# Patient Record
Sex: Female | Born: 1945 | Race: White | Hispanic: No | Marital: Married | Smoking: Former smoker
Health system: Southern US, Community
[De-identification: ages and names within clinical notes are randomized; demographics above are authoritative.]

## PROBLEM LIST (undated history)

## (undated) DIAGNOSIS — R569 Unspecified convulsions: Secondary | ICD-10-CM

## (undated) DIAGNOSIS — H539 Unspecified visual disturbance: Secondary | ICD-10-CM

## (undated) DIAGNOSIS — G709 Myoneural disorder, unspecified: Secondary | ICD-10-CM

## (undated) DIAGNOSIS — G35 Multiple sclerosis: Secondary | ICD-10-CM

## (undated) DIAGNOSIS — I480 Paroxysmal atrial fibrillation: Secondary | ICD-10-CM

## (undated) DIAGNOSIS — K219 Gastro-esophageal reflux disease without esophagitis: Secondary | ICD-10-CM

## (undated) DIAGNOSIS — G259 Extrapyramidal and movement disorder, unspecified: Secondary | ICD-10-CM

## (undated) DIAGNOSIS — I10 Essential (primary) hypertension: Secondary | ICD-10-CM

## (undated) DIAGNOSIS — T7840XA Allergy, unspecified, initial encounter: Secondary | ICD-10-CM

## (undated) DIAGNOSIS — M1990 Unspecified osteoarthritis, unspecified site: Secondary | ICD-10-CM

## (undated) DIAGNOSIS — G2581 Restless legs syndrome: Secondary | ICD-10-CM

## (undated) DIAGNOSIS — N179 Acute kidney failure, unspecified: Secondary | ICD-10-CM

## (undated) DIAGNOSIS — E0590 Thyrotoxicosis, unspecified without thyrotoxic crisis or storm: Secondary | ICD-10-CM

## (undated) DIAGNOSIS — F419 Anxiety disorder, unspecified: Secondary | ICD-10-CM

## (undated) HISTORY — DX: Essential (primary) hypertension: I10

## (undated) HISTORY — PX: BACK SURGERY: SHX140

## (undated) HISTORY — PX: PARTIAL HYSTERECTOMY: SHX80

## (undated) HISTORY — DX: Restless legs syndrome: G25.81

## (undated) HISTORY — DX: Unspecified visual disturbance: H53.9

## (undated) HISTORY — DX: Multiple sclerosis: G35

## (undated) HISTORY — DX: Extrapyramidal and movement disorder, unspecified: G25.9

---

## 2005-05-26 ENCOUNTER — Ambulatory Visit: Payer: Self-pay | Admitting: Family Medicine

## 2005-06-03 ENCOUNTER — Ambulatory Visit: Payer: Self-pay | Admitting: Family Medicine

## 2005-06-11 ENCOUNTER — Inpatient Hospital Stay (HOSPITAL_COMMUNITY): Admission: RE | Admit: 2005-06-11 | Discharge: 2005-06-12 | Payer: Self-pay | Admitting: Neurosurgery

## 2008-11-13 ENCOUNTER — Ambulatory Visit: Payer: Self-pay | Admitting: Vascular Surgery

## 2008-12-25 ENCOUNTER — Ambulatory Visit: Payer: Self-pay | Admitting: Vascular Surgery

## 2009-01-01 ENCOUNTER — Inpatient Hospital Stay: Payer: Self-pay | Admitting: Vascular Surgery

## 2009-04-18 ENCOUNTER — Ambulatory Visit: Payer: Self-pay | Admitting: Internal Medicine

## 2009-11-18 ENCOUNTER — Ambulatory Visit: Payer: Self-pay | Admitting: Neurosurgery

## 2009-12-12 ENCOUNTER — Inpatient Hospital Stay (HOSPITAL_COMMUNITY): Admission: RE | Admit: 2009-12-12 | Discharge: 2009-12-13 | Payer: Self-pay | Admitting: Neurosurgery

## 2010-01-15 ENCOUNTER — Encounter: Admission: RE | Admit: 2010-01-15 | Discharge: 2010-01-15 | Payer: Self-pay | Admitting: Neurosurgery

## 2010-03-12 ENCOUNTER — Encounter: Admission: RE | Admit: 2010-03-12 | Discharge: 2010-03-12 | Payer: Self-pay | Admitting: Neurosurgery

## 2010-10-12 LAB — CBC
HCT: 40.6 % (ref 36.0–46.0)
Hemoglobin: 13.7 g/dL (ref 12.0–15.0)
MCHC: 33.7 g/dL (ref 30.0–36.0)
MCV: 90.8 fL (ref 78.0–100.0)
Platelets: 259 10*3/uL (ref 150–400)
RBC: 4.47 MIL/uL (ref 3.87–5.11)
RDW: 13.6 % (ref 11.5–15.5)
WBC: 7.1 10*3/uL (ref 4.0–10.5)

## 2010-10-12 LAB — TYPE AND SCREEN
ABO/RH(D): O POS
Antibody Screen: NEGATIVE

## 2010-10-12 LAB — DIFFERENTIAL
Basophils Absolute: 0.1 10*3/uL (ref 0.0–0.1)
Basophils Relative: 1 % (ref 0–1)
Eosinophils Absolute: 0.6 10*3/uL (ref 0.0–0.7)
Eosinophils Relative: 8 % — ABNORMAL HIGH (ref 0–5)
Lymphocytes Relative: 31 % (ref 12–46)
Lymphs Abs: 2.2 10*3/uL (ref 0.7–4.0)
Monocytes Absolute: 0.7 10*3/uL (ref 0.1–1.0)
Monocytes Relative: 10 % (ref 3–12)
Neutro Abs: 3.5 10*3/uL (ref 1.7–7.7)
Neutrophils Relative %: 50 % (ref 43–77)

## 2010-10-12 LAB — SURGICAL PCR SCREEN
MRSA, PCR: NEGATIVE
Staphylococcus aureus: POSITIVE — AB

## 2010-10-12 LAB — BASIC METABOLIC PANEL
BUN: 12 mg/dL (ref 6–23)
CO2: 28 mEq/L (ref 19–32)
Calcium: 9.5 mg/dL (ref 8.4–10.5)
Chloride: 104 mEq/L (ref 96–112)
Creatinine, Ser: 0.68 mg/dL (ref 0.4–1.2)
GFR calc Af Amer: >60 mL/min (ref >60)
GFR calc non Af Amer: >60 mL/min (ref >60)
Glucose, Bld: 86 mg/dL (ref 70–99)
Potassium: 3.8 mEq/L (ref 3.5–5.1)
Sodium: 138 mEq/L (ref 135–145)

## 2010-10-12 LAB — ABO/RH: ABO/RH(D): O POS

## 2010-12-11 NOTE — Op Note (Signed)
NAMECARLINE, Brandi Gibbs                 ACCOUNT NO.:  [id number]   MEDICAL RECORD NO.:  [id number]          PATIENT TYPE:  INP   LOCATION:  2899                         FACILITY:  [facility]   PHYSICIAN:  Kathaleen Maser. Pool, M.D.    DATE OF BIRTH:  12-Oct-1945   DATE OF PROCEDURE:  06/11/2005  DATE OF DISCHARGE:                                 OPERATIVE REPORT   SERVICE:  Neurosurgery.   PREOPERATIVE DIAGNOSES:  L4-L5 stenosis.   POSTOPERATIVE DIAGNOSIS:  L4-Lt stenosis.   PROCEDURE:  Bilateral L4-L5 decompressive laminotomies with foraminotomies.   SURGEON:  Kathaleen Maser. Pool, M.D.   ASSISTANT:  Reinaldo Meeker, M.D.   ANESTHESIA:  General endotracheal.   INDICATIONS FOR PROCEDURE:  [name] is a 65 year old female with a history  of multiple sclerosis who presents with back and bilateral lower extremity  pain and claudication consistent with neurogenic claudication from stenosis.  Workup has demonstrated marked stenosis at L4-L5.  The patient presents now  for L4-L5 bilateral decompressive laminotomies and foraminotomies in hopes  of improving her symptoms.   DESCRIPTION OF PROCEDURE:  The patient was brought to the operating room and  placed on the table in a supine position.  After an adequate level of  anesthesia was achieved, the patient was positioned prone on the Wilson  frame and properly padded.  The patient's lumbar region was prepped and  draped sterilely.  A 10 blade was used to make a skin incision was made  overlying the L4-L5 interspace.  This was carried down sharply in the  midline.  Subperiosteal dissection was performed exposing the lamina and  facet joints at L4 and L5.  Deep self-retaining retractors were placed.  Interoperative x-ray was confirmed and the level was confirmed.   A decompressive laminotomy was then performed bilaterally using the high  speed drill and Kerrison rongeurs to remove the inferior aspect of the  lamina of L4, medial aspect of the L4-L5 facet  joint, and the superior rim  of the L5 lamina bilaterally.  The ligamentum flavum was then elevated and  resected in piecemeal fashion using Kerrison rongeurs whereby the thecal sac  and exiting L5 nerve roots were identified bilaterally.  Wide decompressive  foraminotomy was then performed along the course of the exiting L5 nerve  root bilaterally.  The facet joints were then under cut using Kerrison  rongeurs.  After a very thorough decompression had been achieved, there was  no evidence of injury to the thecal sac and nerve roots.  There is no  evidence of any residual compression of the thecal sac and nerve roots.  The  wound was irrigated with antibiotic solution, Gelfoam was placed topically  for hemostasis which was found to be good.  The microscope and retractors  were removed.  Hemostasis in the muscle was achieved  with electrocautery.  The wounds were closed in layers with Vicryl sutures.  Steri-Strips and sterile dressing were applied.  There were no  complications.  The patient tolerated the procedure well and she was  returned to the recovery room in stable condition.  ______________________________  Kathaleen Maser Pool, M.D.     HAP/MEDQ  D:  06/11/2005  T:  06/12/2005  Job:  [id number]

## 2011-02-17 ENCOUNTER — Ambulatory Visit: Payer: Self-pay | Admitting: Unknown Physician Specialty

## 2011-02-19 LAB — PATHOLOGY REPORT

## 2011-07-01 ENCOUNTER — Other Ambulatory Visit: Payer: Self-pay | Admitting: Unknown Physician Specialty

## 2011-11-04 ENCOUNTER — Ambulatory Visit: Payer: Self-pay | Admitting: Family Medicine

## 2013-02-01 ENCOUNTER — Ambulatory Visit: Payer: Self-pay | Admitting: Family Medicine

## 2014-03-28 DIAGNOSIS — I10 Essential (primary) hypertension: Secondary | ICD-10-CM | POA: Insufficient documentation

## 2014-03-28 DIAGNOSIS — G35 Multiple sclerosis: Secondary | ICD-10-CM | POA: Insufficient documentation

## 2014-03-28 DIAGNOSIS — M8580 Other specified disorders of bone density and structure, unspecified site: Secondary | ICD-10-CM | POA: Insufficient documentation

## 2014-03-28 DIAGNOSIS — E785 Hyperlipidemia, unspecified: Secondary | ICD-10-CM | POA: Insufficient documentation

## 2014-03-28 DIAGNOSIS — E0590 Thyrotoxicosis, unspecified without thyrotoxic crisis or storm: Secondary | ICD-10-CM | POA: Insufficient documentation

## 2014-03-28 DIAGNOSIS — G35D Multiple sclerosis, unspecified: Secondary | ICD-10-CM | POA: Insufficient documentation

## 2014-03-28 DIAGNOSIS — E7849 Other hyperlipidemia: Secondary | ICD-10-CM | POA: Insufficient documentation

## 2014-05-02 ENCOUNTER — Ambulatory Visit: Payer: Self-pay | Admitting: Family Medicine

## 2014-09-11 ENCOUNTER — Ambulatory Visit (INDEPENDENT_AMBULATORY_CARE_PROVIDER_SITE_OTHER): Payer: Medicare Other | Admitting: Neurology

## 2014-09-11 ENCOUNTER — Encounter: Payer: Self-pay | Admitting: *Deleted

## 2014-09-11 ENCOUNTER — Encounter: Payer: Self-pay | Admitting: Neurology

## 2014-09-11 VITALS — BP 118/62 | HR 64 | Resp 16 | Ht 63.0 in | Wt 142.4 lb

## 2014-09-11 DIAGNOSIS — M545 Low back pain, unspecified: Secondary | ICD-10-CM

## 2014-09-11 DIAGNOSIS — R4189 Other symptoms and signs involving cognitive functions and awareness: Secondary | ICD-10-CM | POA: Insufficient documentation

## 2014-09-11 DIAGNOSIS — R259 Unspecified abnormal involuntary movements: Secondary | ICD-10-CM | POA: Insufficient documentation

## 2014-09-11 DIAGNOSIS — R258 Other abnormal involuntary movements: Secondary | ICD-10-CM

## 2014-09-11 DIAGNOSIS — R069 Unspecified abnormalities of breathing: Secondary | ICD-10-CM | POA: Insufficient documentation

## 2014-09-11 DIAGNOSIS — G2581 Restless legs syndrome: Secondary | ICD-10-CM | POA: Diagnosis not present

## 2014-09-11 DIAGNOSIS — G4700 Insomnia, unspecified: Secondary | ICD-10-CM | POA: Diagnosis not present

## 2014-09-11 DIAGNOSIS — G35 Multiple sclerosis: Secondary | ICD-10-CM | POA: Diagnosis not present

## 2014-09-11 DIAGNOSIS — R269 Unspecified abnormalities of gait and mobility: Secondary | ICD-10-CM | POA: Insufficient documentation

## 2014-09-11 DIAGNOSIS — R6889 Other general symptoms and signs: Secondary | ICD-10-CM | POA: Insufficient documentation

## 2014-09-11 DIAGNOSIS — R252 Cramp and spasm: Secondary | ICD-10-CM | POA: Insufficient documentation

## 2014-09-11 DIAGNOSIS — R3911 Hesitancy of micturition: Secondary | ICD-10-CM | POA: Insufficient documentation

## 2014-09-11 DIAGNOSIS — R260 Ataxic gait: Secondary | ICD-10-CM | POA: Diagnosis not present

## 2014-09-11 DIAGNOSIS — R5383 Other fatigue: Secondary | ICD-10-CM | POA: Diagnosis not present

## 2014-09-11 DIAGNOSIS — R0683 Snoring: Secondary | ICD-10-CM | POA: Insufficient documentation

## 2014-09-11 MED ORDER — OXYBUTYNIN CHLORIDE 5 MG PO TABS: 5.0000 mg | ORAL_TABLET | Freq: Two times a day (BID) | ORAL | Status: DC

## 2014-09-11 MED ORDER — PRAMIPEXOLE DIHYDROCHLORIDE 0.5 MG PO TABS: 0.5000 mg | ORAL_TABLET | Freq: Three times a day (TID) | ORAL | Status: DC

## 2014-09-11 MED ORDER — BACLOFEN 10 MG PO TABS: 10.0000 mg | ORAL_TABLET | Freq: Three times a day (TID) | ORAL | Status: DC

## 2014-09-11 MED ORDER — HYDROCODONE-ACETAMINOPHEN 5-325 MG PO TABS: 1.0000 | ORAL_TABLET | Freq: Two times a day (BID) | ORAL | Status: DC | PRN

## 2014-09-11 MED ORDER — TAMSULOSIN HCL 0.4 MG PO CAPS: 0.4000 mg | ORAL_CAPSULE | Freq: Every day | ORAL | Status: DC

## 2014-09-11 NOTE — Progress Notes (Signed)
[facility]  PATIENT: Brandi Gibbs DOB: November 03, 1945  REFERRING DOCTOR OR PCP:  Dorothey Baseman SOURCE: Patient  _________________________________   HISTORICAL  CHIEF COMPLAINT:  Chief Complaint  Patient presents with  . Multiple Sclerosis    She is not currently on any MS therapy--she stopped ? Betaseron in Dec 2015 due to inj. site fatigue.  She sts. fatigue is worse, she is dragging right foot some more.  She has a brace at home for foot drop.  She would like to discuss starting a po med for ms./fim    HISTORY OF PRESENT ILLNESS:  Brandi Gibbs is a 69 year old with multiple sclerosis who was diagnosed in the mid 1980s after presenting with right sided paralysis. She had MRIs and a lumbar puncture performed. They were reportedly consistent with multiple sclerosis. Initially, she was not placed on any medications. She had several exacerbations though in the 1990s that affected her gait. I started to see her around 2000 and 10 2002. At that time, her gait was poor and she needed a cane for short distance and walker for longer distance we started Avonex and she tolerated it well. MRIs did not show any major changes since that time. However, she has continued to have some worsening of her gait and now needs to use a walker full-time and also cannot walk as far. On multiple occasions, we discussed changing to different medications but she was reluctant to move from Avonex. However, she is tired of the injections she would like to reconsider one of the oral agents at this point.  He has several symptoms related to her multiple sclerosis. Specifically, she reports leg weakness and spasticity and has a poor gait. She also reports fatigue, urinary dysfunction and some cognitive concerns.  She began to use a cane in the 1990s and began to use a walker about 10 years later. Her right leg is much worse than the left leg. He notes weakness as well as quite a bit of spasticity in the  right leg. At times, her leg will have a phasic spasm that takes a while to break. She falls on occasion with a couple falls over the last few months. She denies any significant numbness, tingling or dysesthesias in the legs.  She also has had a lot of difficulty with her bladder. She has urinary hesitancy as well as frequency. She is on tamsulosin and oxybutynin. She does better on these medications but still has frequency and nocturia. However, she has less incontinence with the medications. She denies any recent urinary tract infections.  She feels both of her eyes are bad and the changes have occurred over many years. She has not seen an eye doctor recently. Vision is blurry. She denies double vision. There is no eye pain. She denies any significant asymmetry in acuity or color vision.  She notes both physical and cognitive fatigue. She has tried amantadine with little benefit. Fatigue can fluctuate quite a bit day-to-day. She does especially poorly when she is in hot weather. Often the fatigue will be worse later in the day. She reports insomnia with more difficulty staying asleep and falling asleep the first time. She reports that she snores and her husband has told her that she has some irregular breathing at night. She has excessive daytime sleepiness. Sleep studies have been discussed in the past but we have not been able to get one yet.  She feels that her cognitive issues are getting worse. Specifically she has noted  difficulty with short-term memory. She also has trouble coming up with the right words. She notes difficulty with apathy and with executive function. She has trouble planning tasks and completing them.  She denies any significant depression.  She had back surgery about 5 years ago for a herniated disc and lumbar radiculopathy. Still reports some back pain at times she occasionally needs to take hydrocodone when the pain becomes more severe, but this is not daily.  REVIEW OF  SYSTEMS: Constitutional: No fevers, chills, sweats, or change in appetite.   She is very tired all the time Eyes: Some visual changes, but no double vision or eye pain Ear, nose and throat: No hearing loss, ear pain, nasal congestion, sore throat Cardiovascular: No chest pain, palpitations Respiratory: No shortness of breath at rest or with exertion.   No wheezes GastrointestinaI: No nausea, vomiting, diarrhea, abdominal pain, fecal incontinence Genitourinary: see above. Musculoskeletal: Mild neck pain, moderate back pain Integumentary: No rash, pruritus, skin lesions Neurological: as above Psychiatric: No depression at this time.  No anxiety. However, apathetic and cognitive changes (as above) Endocrine: No palpitations, diaphoresis, change in appetite, change in weigh or increased thirst Hematologic/Lymphatic: No anemia, purpura, petechiae. Allergic/Immunologic: No itchy/runny eyes, nasal congestion, recent allergic reactions, rashes  ALLERGIES: Allergies  Allergen Reactions  . Other Other (See Comments)    Blood thinners - GI bleed  . Omeprazole Rash    HOME MEDICATIONS:  Current outpatient prescriptions:  .  Amantadine HCl 100 MG tablet, Take 100 mg by mouth 2 (two) times daily., Disp: , Rfl:  .  amLODipine (NORVASC) 5 MG tablet, TAKE 1 TABLET EVERY DAY, Disp: , Rfl:  .  atenolol-chlorthalidone (TENORETIC) 50-25 MG per tablet, TAKE 1 TABLET EVERY DAY, Disp: , Rfl:  .  HYDROcodone-acetaminophen (NORCO/VICODIN) 5-325 MG per tablet, Take by mouth., Disp: , Rfl:  .  lisinopril (PRINIVIL,ZESTRIL) 40 MG tablet, TAKE 1 TABLET EVERY DAY, Disp: , Rfl:  .  methimazole (TAPAZOLE) 10 MG tablet, TAKE ONE TABLET TWICE A DAY, Disp: , Rfl:  .  oxybutynin (DITROPAN) 5 MG tablet, Take by mouth., Disp: , Rfl:  .  pramipexole (MIRAPEX) 0.5 MG tablet, Take 0.5 mg by mouth 3 (three) times daily., Disp: , Rfl:  .  tamsulosin (FLOMAX) 0.4 MG CAPS capsule, Take by mouth., Disp: , Rfl:   PAST  MEDICAL HISTORY: Past Medical History  Diagnosis Date  . Multiple sclerosis   . Movement disorder   . Hypertension   . Vision abnormalities   . Restless leg syndrome     PAST SURGICAL HISTORY: Past Surgical History  Procedure Laterality Date  . Partial hysterectomy    . Back surgery      FAMILY HISTORY: Family History  Problem Relation Age of Onset  . Leukemia Mother   . Healthy Father     SOCIAL HISTORY:  History   Social History  . Marital Status: Married    Spouse Name: N/A  . Number of Children: N/A  . Years of Education: N/A   Occupational History  . Not on file.   Social History Main Topics  . Smoking status: Never Smoker   . Smokeless tobacco: Not on file  . Alcohol Use: 0.0 oz/week    0 Standard drinks or equivalent per week     Comment: rare/fim  . Drug Use: No  . Sexual Activity: Not on file   Other Topics Concern  . Not on file   Social History Narrative  . No narrative on  file     PHYSICAL EXAM  Filed Vitals:   09/11/14 1111  BP: 118/62  Pulse: 64  Resp: 16  Height:  (1.6 m)  Weight: 142 lb 6.4 oz (64.592 kg)    Body mass index is 25.23 kg/(m^2).   General: The patient is well-developed and well-nourished and in no acute distress  Eyes:  Funduscopic exam shows normal optic discs and retinal vessels.  Neck: The neck is supple, no carotid bruits are noted.  The neck is nontender.  Cardiovascular: The heart has a regular rate and rhythm with a normal S1 and S2. There were no murmurs, gallops or rubs. Lungs are clear to auscultation.  Skin: Extremities are without significant edema.  Musculoskeletal:  Back is nontender  Neurologic Exam  Mental status: The patient is alert and oriented x 3 at the time of the examination. The patient has apparent normal recent and remote memory, with an apparently normal attention span and concentration ability.   Speech is normal.  Cranial nerves: Extraocular movements are full. Pupils  are equal, round, and reactive to light and accomodation.  Visual fields are full.  Facial symmetry is present. There is good facial sensation to soft touch bilaterally.Facial strength is normal.  Trapezius and sternocleidomastoid strength is normal. No dysarthria is noted.  The tongue is midline, and the patient has symmetric elevation of the soft palate. No obvious hearing deficits are noted.  Motor:  Muscle bulk is normal.   Tone is increased in both legs, slightly more on the rightl. Strength is  5 / 5 in all arms, 4/5 in right foot and 4+/5 in left foot..   Sensory: Sensory testing is intact to pinprick, soft touch and vibration sensation in all 4 extremities.  Coordination: Cerebellar testing reveals good finger-nose-finger and heel-to-shin bilaterally.  Gait and station: Station is normal.   Gait is spastic and requires support.  She has superimposed right foot drop. Can not tandem walk. Romberg is negative.   Reflexes: Deep tendon reflexes are symmetric and increased bilaterally.  Spread at knees.   2 beatrs clonus at right ankle.   Plantar responses are extensor right and equivocal left.Marland Kitchen    DIAGNOSTIC DATA (LABS, IMAGING, TESTING) - I reviewed patient records, labs, notes, testing and imaging myself where available.     ASSESSMENT AND PLAN  Ataxic gait  DS (disseminated sclerosis)  Cognitive changes  Other fatigue  Urinary hesitancy  Spasticity  Midline low back pain without sciatica  Insomnia  Restless leg  Snoring    In summary, Brandi Gibbs is a 69 year old woman with multiple sclerosis and spastic gait, cognitive impairment, fatigue and urinary dysfunction. We had a long discussion about therapy. She needs to get back on a DMT but would prefer to go on an oral agent. Last year, we had tested her to Aubagio but she changed her mind and stayed on Avonex. We will go ahead and start Aubagio at this time. She will need to get some liver tests done over the next few  months. Her TB test was negative last year. I will continue her medicines for her bladder and restless legs and add baclofen to see if that can help her spasticity more. This may also help her insomnia later this year, we will check an MRI of the brain to rule out clinical progression. We had a discussion about the possibility of sleep apnea and that it may be playing some role in her fatigue and cognitive changes. She would prefer  not to have a sleep study at this time.  I will discuss this with her again at her next visit.  She will return to see me in 3 or 4 months or sooner if she has new or worsening neurologic symptoms.  Richard A. Epimenio Foot, MD, PhD 09/11/2014, 11:23 AM Certified in Neurology, Clinical Neurophysiology, Sleep Medicine, Pain Medicine and Neuroimaging  [facility] [address] [place], [place] [address] [phone]

## 2014-10-08 ENCOUNTER — Telehealth: Payer: Self-pay | Admitting: *Deleted

## 2014-10-08 ENCOUNTER — Inpatient Hospital Stay: Payer: Self-pay | Admitting: Internal Medicine

## 2014-10-08 NOTE — Telephone Encounter (Signed)
Spoke with husband who sts. Kyran has been getting weaker, has had increased incontinence of bowel and bladder, ? decubitus ulcer over the last several days--she was Admitted to [facility] today./fim

## 2014-10-08 NOTE — Telephone Encounter (Signed)
Husband is calling back stating pt was sent by ambulance to [facility] in [place].  He is sure they will keep her, she can't walk or go to the bathroom.  He just wanted to make you aware, no call back is needed.

## 2014-10-08 NOTE — Telephone Encounter (Signed)
Patient would like a call back did not want to state reason.

## 2014-10-08 NOTE — Telephone Encounter (Signed)
LMTC./fim 

## 2014-10-15 ENCOUNTER — Ambulatory Visit: Admit: 2014-10-15 | Disposition: A | Discharge status: Home / Self Care | Payer: Self-pay | Admitting: Neurology

## 2014-10-17 LAB — BASIC METABOLIC PANEL
Anion Gap: 5 — ABNORMAL LOW (ref 7–16)
BUN: 29 mg/dL — ABNORMAL HIGH
Calcium, Total: 8.7 mg/dL — ABNORMAL LOW
Chloride: 111 mmol/L
Co2: 29 mmol/L
Creatinine: 0.6 mg/dL
EGFR (African American): >60
EGFR (Non-African Amer.): >60
Glucose: 227 mg/dL — ABNORMAL HIGH
Potassium: 3.3 mmol/L — ABNORMAL LOW
Sodium: 145 mmol/L

## 2014-11-24 NOTE — H&P (Signed)
PATIENT NAME:  Brandi Gibbs, VAZQUES MR#:  [id number] DATE OF BIRTH:  [birth year]  DATE OF ADMISSION:  10/08/2014  PRIMARY CARE PHYSICIAN:  Dorothey Baseman, MD.    HISTORY OF PRESENT ILLNESS:  The patient is a 69 year old Caucasian female, history of multiple sclerosis, also history of dysphagia in the past, who presented to the hospital with complaints of inability to ambulate, weakness over the past 3 days.  Apparently the patient was doing well up until approximately two weeks ago when she started having problems with weakness and inability to ambulate. She also noted strong odor of her urine. She felt feverish at home, but denied any significant chills. She admits of weight loss of over 20 pounds over a couple of months now which she describes as intentional. She was brought to Emergency Room for further evaluation. In the Emergency Room she was noted to have urinary tract infection as well as significant hypotension with systolic blood pressure below 130Q in 90s.  She was given IV fluids and her blood pressure somewhat improved now. Hospitalist services were contacted for admission since she was also noted to have elevated white blood cell count and as mentioned above urinary tract infection.   PAST MEDICAL HISTORY: Significant for history of multiple sclerosis with right-sided weakness, history of dysphagia in the past which was felt to be due to candidal esophagitis, also history of ulcerative colitis, hypertension, anemia, carotid artery stenosis status post right carotid endarterectomy in June of 2010, history of atrial fibrillation, RVR, status post TEE and cardioversion in 2010, status post upper GI endoscopy in July 2012 by Dr. Lynnae Prude revealing gastritis only, status post colonoscopy at the same time July 2012 by Dr. Mechele Collin revealing a 5 mm polyp in the ascending colon, inflammation was found secondary to left-sided colitis.   MEDICATION LIST:  The patient's medication list unfortunately is not  reconciled yet, the patient was apparently on acetaminophen and hydrocodone 500/5 mg 1 tablet 4 times daily as needed, amlodipine 5 mg p.o. daily, aspirin 81 mg p.o. daily, Avonex 30 mcg intramuscularly weekly, bisoprolol/HCTZ 5/6.25 mg 1 tablet daily, calcium with vitamin D 1 tablet twice daily, ciprofloxacin 500 mg twice dailky,  green tea fat burner, lisinopril 40 mg p.o. daily, methimazole 10 mg 2 tablets twice daily, Norco 1-2 tablets every 6 hours as needed, oxybutynin 5 mg 2-3 times daily, Papaya Enzyme Plus after meals as needed, pramipexole 0.25 mg p.o. at bedtime, Triple Omega 3-6-9 one tablet daily, women's multivitamins twice daily, vitamin B complex once daily, vitamin B12, 100 mcg p.o. daily, vitamin D3, 1000 units twice daily. It is unclear if she is still taking all those medications.   FAMILY HISTORY:  The patient's father died of AAA rupture, the patient's mother had leukemia.   SOCIAL HISTORY: The patient smokes 4-5 cigarettes a day for approximately 30-40 years. No alcohol abuse. She lives with her husband.   REVIEW OF SYSTEMS: Positive for feeling feverish, fatigue and weak, weight loss approximately 20 pounds over a couple of months, she tells me it was intentional. Admits of having glaucoma for which she is not taking any medications, intermittent diarrhea as well as urinary incontinence intermittently. Otherwise denies any abnormalities.  No pains.  No weight gain.  EYES: Denies any blurry vision, double vision, cataracts.   EARS, NOSE, THROAT:  Denies any tinnitus, allergies, epistaxis, sinus pain, dentures, difficulty swallowing.   RESPIRATORY:  Denies cough, wheezes, asthma, or COPD.   CARDIOVASCULAR: Denies chest pains, orthopnea, edema, arrhythmias, palpitations.  GASTROINTESTINAL: Denies any nausea, vomiting, abdominal pain, rectal bleeding, change in bowel habits.  GENITOURINARY: Denies dysuria, hematuria, frequency.   ENDOCRINE:  Denies polydipsia, nocturia, thyroid  problems, heat or cold intolerance, or thirst.  HEMATOLOGIC: Denies anemia, easy bruising or bleeding, swollen glands.  SKIN: Denies any acne, rashes, change in moles.  MUSCULOSKELETAL: Denies arthritis, cramps, swelling.  NEUROLOGIC:  No numbness, epilepsy, or tremor.  PSYCHIATRIC:  Denies anxiety, insomnia, or depression.   PHYSICAL EXAMINATION:  VITAL SIGNS: On arrival to the hospital the patient's vital signs, temperature was 98.4, pulse was 74, respiration was 18, blood pressure 91/43, saturation was 98% on room air. With time with IV fluid administration the patient's blood pressure improved to 130/45, heart rate is 54, respiration was 16 most recently. O2 saturations remained stable.  GENERAL: This is a well-developed, well-nourished Caucasian female in no significant distress, lying on the stretcher. She is somewhat flushed in the face laying on the stretcher.  HEENT:  She does have conjunctivitis in her left eye. Normal hearing. No pharyngeal erythema. Mucosa is dry.   NECK:  No masses.  Supple, nontender. Thyroid not enlarged. No adenopathy. No JVD or carotid bruits bilaterally. Full range of motion. LUNGS: Clear to auscultation in all fields anteriorly. No rales, rhonchi, diminished breath sounds, or wheezing.  No labored inspiration, increased effort, dullness to percussion, or overt respiratory distress.   CARDIOVASCULAR: S1, S2 appreciated. Rhythm is regular. PMI is not lateralized. Chest is nontender to palpation.  1 + pedal pulses.   EXTREMITIES: No lower extremity edema, calf tenderness, or cyanosis was noted.  ABDOMEN: Soft, nontender. Bowel sounds are present. Minimal discomfort was felt in suprapubic area, but no hepatosplenomegaly or masses were noted.  RECTAL: Deferred.  MUSCLE STRENGTH: Able to move all extremities. No cyanosis, degenerative joint disease. Not able to assess for kyphosis.   SKIN: Did not reveal any rashes, lesions, erythema, nodularity, or induration. It was  warm and dry to palpation.  LYMPHATIC: No adenopathy in the cervical region.  NEUROLOGIC: Cranial nerves grossly intact. Sensory is difficult to obtain as the patient is somewhat somnolent.  No significant dysarthria or aphasia. The patient is somnolent, although she waking up and intermittently she was able to discuss her issues and then she would drift to sleep.  Memory is good.  No significant confusion, agitation, or depression noted.   LABORATORIES: BMP showed glucose level of 189, BUN and creatinine were 52 and 1.58, sodium 132, potassium 2.2, otherwise BMP was unremarkable. Lactic acid level was 1.2. Liver enzymes, albumin level of 2.9, otherwise normal. CK total was 198, MB fraction was 6.7. Troponin 0.12 on the first set. The patient's white blood cell count is elevated to 12.5, hemoglobin 10.9, platelet count 240,000.  Urinalysis, yellow turbid urine, negative for glucose, bilirubin, or ketones, specific gravity 1.014, pH was 5.0, 1 + blood, 100 mg/dL protein, negative for nitrites, 3 + leukocyte esterase, 12 red blood cells, 1807 white blood cells, 3 + bacteria, no epithelial cells, white blood cell clumps were present as well as mucus. Chest x-ray portable single view 10/08/2014 showed no acute bony pathology, chronic changes right base noted.   ASSESSMENT AND PLAN:  1.  Systemic inflammatory response syndrome.  Admit patient to medical floor. Continue her on antibiotic Zosyn. We will get blood cultures as well as urine cultures. Continue IV fluids and we will follow culture results and adjust antibiotics according to culture results.  2.  Acute cystitis without hematuria, questionable pyelonephritis. We will  get ultrasound of kidneys. We will get urine cultures as well as blood cultures. We will continue antibiotic therapy. We will follow culture results as above.  3.  Acute renal failure, likely acute tubular necrosis.  We will start IV fluids and  we will follow up kidney function in the  morning.  4.  Leukocytosis. We will follow with therapy.  5.  Non-Q-wave myocardial infarction, likely type 2, demand ischemia. Continue aspirin as well as heparin subcutaneously. Unable to use nitroglycerin and metoprolol due to hypotension although we may need to need to initiate metoprolol depending on her blood pressure readings. Although the patient's heart rate now decreased from 74 on admission initial arrival to the hospital to 54 during my evaluation.   TIME SPENT: 40 minutes.    ____________________________ Katharina Caper, MD rv:bu D: 10/08/2014 19:17:59 ET T: 10/08/2014 19:54:12 ET JOB#: [id number]  cc: Katharina Caper, MD, <Dictator> Teena Irani. Terance Hart, MD Katharina Caper MD ELECTRONICALLY SIGNED 11/03/2014 14:55

## 2014-11-24 NOTE — Consult Note (Signed)
Referring Physician:  Theodoro Grist :   Primary Care Physician:  Theodoro Grist : [facility], [address], [place], [place] [address], [place]  Reason for Consult: Admit Date: 10-Oct-2014  Chief Complaint: confusion  Reason for Consult: behavior changes   History of Present Illness: History of Present Illness:   seen at request of Dr. Mortimer Fries for confusion;  69 yo RHD F with hx of multiple sclerosis for 20+ years presents to [facility] with some confusion.  She was noted to be hypotensive with UTI.  She had some respiratory distress and sepsis so she was placed in the ICU.  Now after those have cleared she continues to be confused so Neurology was called.  Per husband, she was on Avenox for the past 20 years per [facility] but has been off medications for the past year plus.  Her last flair was a few years ago and they are unsure which type of Multiple sclerosis that she has.  ROS:  Review of Systems   unobtainable secondary to mental status  Past Medical/Surgical Hx:  Glaucoma:   Hypertension:   RLS:   Multiple Sclerosis:   Hyperthyroidism:   Hysterectomy:   Removal Spurs on Spine:   Past Medical/ Surgical Hx:  Past Medical History reviewed by me as above   Past Surgical History reviewed by me as above   Home Medications: Medication Instructions Last Modified Date/Time  norco 1-2 tabs q6h prn  16-Mar-16 00:39  pramipexole 0.25 mg oral tablet 1 tab(s) PO once a day (at bedtime)  pt takes 0.5 mg dose 3 times a day 16-Mar-16 19:38  Vitamin B Complex oral tablet 1 tab(s) PO once a day  16-Mar-16 00:39  Ultra Women's Multivitamin 1 tab(s) PO 2 times a day  16-Mar-16 00:39  Triple Omega 3-6-9 1 tab(s) PO once a day  16-Mar-16 00:39  Vitamin B12 100 mcg oral tablet 1 tab(s) PO once a day  16-Mar-16 00:39  Papaya Enzyme Plus 1 tab(s) PO after meals PRN   16-Mar-16 00:39  lisinopril 40 mg oral tablet 1 tab(s) PO once a day  16-Mar-16 00:39  amlodipine  5 mg oral tablet 1 tab(s) PO once a day  16-Mar-16 00:39  acetaminophen-hydrocodone 500 mg-5 mg oral tablet 1 tab(s) PO 4 times a day PRN   16-Mar-16 00:39  oxybutynin 5 mg oral tablet 1 tab(s)  2-3 times a day 16-Mar-16 00:39  bisoprolol-hydrochlorothiazide 5 mg-6.25 mg oral tablet 1 tab(s) PO once a day  16-Mar-16 00:39  aspirin 81 mg oral tablet 1 tab(s) PO once a day  16-Mar-16 00:39  Vitamin D3 1000 intl units oral tablet 1 tab(s) PO 2 times a day  16-Mar-16 00:39  Calcium 600 mg. + D3 250 mg. 1 tab(s) PO 2 times a day  16-Mar-16 00:39  Green Tea Fat Burner 2 tab(s) PO once a day  16-Mar-16 00:39   Allergies:  Prednisone: Other  Allergies:  Allergies prednisone   Social/Family History: Employment Status: disabled  Lives With: spouse  Living Arrangements: house  Social History: no tob, no EtOH, no illicits  Family History: no seizures, no stroke   Vital Signs: **Vital Signs.:   21-Mar-16 10:00  Vital Signs Type Routine  Pulse Pulse 93  Pulse source if not from Vital Sign Device per cardiac monitor  Respirations Respirations 21  Systolic BP Systolic BP 974  Diastolic BP (mmHg) Diastolic BP (mmHg) 163  Mean BP 125  BP Source  if not from Vital Sign  Device non-invasive  Pulse Ox % Pulse Ox % 97  Pulse Ox Activity Level  At rest  Oxygen Delivery 2L; Nasal Cannula  Pulse Ox Heart Rate 92   Physical Exam: General: overweight, mild distress  HEENT: normocephalic, sclera nonicteric, oropharynx clear  Neck: supple, no JVD, no bruits  Chest: CTA B, no wheezing  Cardiac: RRR, no murmurs, no edema, 2+ pulses  Extremities: no C/C/E, FROM   Neurologic Exam: Mental Status: sleepy and oriented to person only, moderate dysarthria, severe bradyphrenia, follows simple commands  Cranial Nerves: PERRLA, EOMI, nl VF but neglect to simultaneous stimulation on R, face symmetric with some L ptosis, tongue midline, shoulder shrug equal  Motor Exam: 5-/5 B, normal tone, no tremor  Deep  Tendon Reflexes: 2+/4 B, plantars downgoing B, no Hoffman  Sensory Exam: neglect of the R side to touch, seems to feel cold equally  Coordination: untestable   Lab Results: LabObservation:  16-Mar-16 10:03   OBSERVATION Reason for Test  Hepatic:  15-Mar-16 15:57   Bilirubin, Total 0.7 (0.3-1.2 NOTE: New Reference Range  09/17/14)  Alkaline Phosphatase 85 (38-126 NOTE: New Reference Range  09/17/14)  SGPT (ALT) 29 (14-54 NOTE: New Reference Range  09/17/14)  SGOT (AST) 39 (15-41 NOTE: New Reference Range  09/17/14)  Total Protein, Serum 7.2 (6.5-8.1 NOTE: New Reference Range  09/17/14)  Albumin, Serum  2.9 (3.5-5.0 NOTE: New reference range  09/17/14)  Routine Micro:  15-Mar-16 15:57   Organism Name Escherichia coli  Organism Quantity >100,000 CFU/ML  Nitrofurantoin Sensitivity S  Cefazolin Sensitivity S  Ampicillin Sensitivity S  Ceftriaxone Sensitivity S  Ciprofloxacin Sensitivity S  Gentamicin Sensitivity S  Imipenem Sensitivity S  Levofloxacin Sensitivity S  Trimethoprim/Sulfamethoxazole Sensitivty S  Specimen Source IN AND OUT CATH  Organism 1 >100,000 CFU/ML Escherichia coli  Result(s) reported on 10 Oct 2014 at 11:53AM.  Culture Comment ID TO FOLLOW SENSITIVITIES TO FOLLOW  Result(s) reported on 09 Oct 2014 at 01:42PM.  17-Mar-16 12:27   Micro Text Report CLOSTRIDIUM DIFFICILE   C.DIFFICILE ANTIGEN       C.DIFFICILE GDH ANTIGEN : NEGATIVE   C.DIFFICILE TOXIN A/B     C.DIFFICILE TOXINS A AND B : NEGATIVE   INTERPRETATION            Negative for C. difficile.    ANTIBIOTIC                        Routine Chem:  18-Mar-16 13:00   Lactic Acid  Venous 1.2 (0.5-2.0 NOTE: New Reference Range  09/17/14)  20-Mar-16 03:39   Result Comment - BE is 4.2  Result(s) reported on 13 Oct 2014 at 03:46AM.  21-Mar-16 11:00   Magnesium, Serum  1.5 (1.7-2.4 THERAPEUTIC RANGE: 4-7 mg/dL TOXIC: > 10 mg/dL  ----------------------- NOTE: New Reference Range  09/17/14)   Phosphorus, Serum 3.0 (2.5-4.6 NOTE: New Reference Range  09/17/14)  Glucose, Serum 80 (65-99 NOTE: New Reference Range  09/17/14)  BUN 9 (6-20 NOTE: New Reference Range  09/17/14)  Creatinine (comp) 0.65 (0.44-1.00 NOTE: New Reference Range  09/17/14)  Sodium, Serum 139 (135-145 NOTE: New Reference Range  09/17/14)  Potassium, Serum 4.1 (3.5-5.1 NOTE: New Reference Range  09/17/14)  Chloride, Serum 104 (101-111 NOTE: New Reference Range  09/17/14)  CO2, Serum 24 (22-32 NOTE: New Reference Range  09/17/14)  Calcium (Total), Serum  8.2 (8.9-10.3 NOTE: New Reference Range  09/17/14)  Anion Gap 11  eGFR (African American) >60  eGFR (  Non-African American) >60 (eGFR values <71m/min/1.73 m2 may be an indication of chronic kidney disease (CKD). Calculated eGFR is useful in patients with stable renal function. The eGFR calculation will not be reliable in acutely ill patients when serum creatinine is changing rapidly. It is not useful in patients on dialysis. The eGFR calculation may not be applicable to patients at the low and high extremes of body sizes, pregnant women, and vegetarians.)  Cardiac:  15-Mar-16 15:57   CK, Total 198 (38-234 NOTE: New Reference Range  09/17/14)  CPK-MB, Serum  6.7 (0.5-5.0 NOTE: New Reference Range  09/17/14)    23:45   Troponin I  0.06 (0.00-0.03 0.03 ng/mL or less: NEGATIVE  Repeat testing in 3-6 hrs  if clinically indicated. >0.03 ng/mL: POTENTIAL  MYOCARDIAL INJURY. Repeat  testing in 3-6 hrs if  clinically indicated. NOTE: An increase or decrease  of 30% or more on serial  testing suggests a  clinically important change NOTE: New Reference Range  09/17/14)  Routine UA:  15-Mar-16 15:57   Color (UA) Yellow  Clarity (UA) Turbid  Glucose (UA) Negative  Bilirubin (UA) Negative  Ketones (UA) Negative  Specific Gravity (UA) 1.014  Blood (UA) 1+  pH (UA) 5.0  Protein (UA) 100 mg/dL  Nitrite (UA) Negative  Leukocyte  Esterase (UA) 3+ (Result(s) reported on 08 Oct 2014 at 04:18PM.)  RBC (UA) 12 /HPF  WBC (UA) 1807 /HPF  Bacteria (UA) 3+  Epithelial Cells (UA) NONE SEEN  WBC Clump (UA) PRESENT  Mucous (UA) PRESENT (Result(s) reported on 08 Oct 2014 at 04:18PM.)  Routine Hem:  20-Mar-16 07:37   WBC (CBC) 10.5  RBC (CBC) 3.80  Hemoglobin (CBC)  10.6  Hematocrit (CBC)  33.5  Platelet Count (CBC) 391  MCV 88  MCH 27.8  MCHC  31.6  RDW 14.1  Neutrophil % 84.5  Lymphocyte % 6.9  Monocyte % 6.0  Eosinophil % 0.9  Basophil % 1.7  Neutrophil #  8.9  Lymphocyte #  0.7  Monocyte # 0.6  Eosinophil # 0.1  Basophil #  0.2 (Result(s) reported on 13 Oct 2014 at 0[facility])   Radiology Results: CT:    18-Mar-16 17:09, CT Head Without Contrast  CT Head Without Contrast   REASON FOR EXAM:    confusion and history o f MS  COMMENTS:       PROCEDURE: CT  - CT HEAD WITHOUT CONTRAST  - Oct 11 2014  5:09PM     CLINICAL DATA:  Confusion.  History of MS.    EXAM:  CT HEAD WITHOUT CONTRAST    TECHNIQUE:  Contiguous axial images were obtained from the base of the skull  through the vertex without intravenous contrast.    COMPARISON:  None.  FINDINGS:  Moderate atrophy.  Ventricular enlargement consistent with atrophy.    Hypodensity throughout the periventricular and deep white matter.  Chronic well circumscribed low densities throughout the basal  ganglia and thalamus bilaterally. Well-defined low densities in the  pons bilaterally.    Negative for acute infarct.  Negative for hemorrhage or mass.    Calvarium intact.     IMPRESSION:  Atrophy.  No acute abnormality.  Diffuse white matter hypodensity could be seen with chronic MS or  chronic microvascular ischemic change. Chronic lacunar hypodensities  throughout the basal ganglia brainstem most likely due to chronic  ischemia.      Electronically Signed    By: CFranchot GalloM.D.    On: 10/11/2014 17:20  Verified By: Truett Perna, M.D.,   Radiology Impression: Radiology Impression: CT of head personally reviewed by me and shows severe subcortical white matter changes   Impression/Recommendations: Recommendations:   prior notes reviewed by me reviewed by me   R sided neglect-  this could be a stroke or multiple sclerosis causing this;  this is likely cause of confusion and slowness of thought as well Multiple sclerosis-  not on any preventative therapy now MRI of brain w/wo contrast start solumedrol 521m q12h x 3 days check Vit D, ESR, CRP continue ASA 852mdaily treat infections will EEG if MRI is negative will follow  Electronic Signatures: SmJamison NeighborMD)  (Signed 21-Mar-16 13:59)  Authored: REFERRING PHYSICIAN, Primary Care Physician, Consult, History of Present Illness, Review of Systems, PAST MEDICAL/SURGICAL HISTORY, HOME MEDICATIONS, ALLERGIES, Social/Family History, NURSING VITAL SIGNS, Physical Exam-, LAB RESULTS, RADIOLOGY RESULTS, Recommendations   Last Updated: 21-Mar-16 13:59 by SmJamison NeighborMD)

## 2014-11-24 NOTE — Discharge Summary (Signed)
PATIENT NAME:  Brandi Gibbs, HOLEMAN MR#:  [id number] DATE OF BIRTH:  June 28, 1946  DATE OF ADMISSION:  10/08/2014 DATE OF DISCHARGE:  10/18/2014  DISCHARGE DIAGNOSES:  1. Acute encephalopathy due to urinary tract infection, as well as multiple sclerosis disease flare.  2. Acute respiratory failure with intubation requiring intubation for airway protection.  3. Urinary tract infection complex in nature status post treatment.  4. Multiple sclerosis with acute exacerbation status post treatment with high dose steroids.  5. Chronic atrial fibrillation.  6. Labile blood pressure.  7. Restless leg syndrome.  8. Generalized deconditioning. Needs further rehabilitation.  9. Elevated troponin, felt to be due to demand ischemia.  10. Chronic right-sided weakness due to multiple sclerosis.  11. History of dysphagia in the past, felt to be due to candidal esophagitis.  12. History of ulcerative colitis.  13. History of anemia.  14. Carotid artery stenosis status post right carotid endarterectomy.  15. History of atrial fibrillation with RVR history of status post TEE and cardioversion. 16.  Severe electrolyte imbalances, status post replacement. 17. Acute kidney injury, likely due to dehydration, status post treatment.  CONSULTANTS DURING HOSPITALIZATION: Dr. Darrold Junker, Dr. Dema Severin, Dr. Loretha Brasil.  Pertinent and evaluations: Admitting glucose 189, BUN 52, creatinine 1.58, sodium was 132, potassium was 2.2, chloride 93, CO2 23. LFTs showed albumin of 2.9. Troponin was 0.1, 2.08 and 0.06. WBC 14.5, hemoglobin 10.9, platelet count was 240. Urine culture showed greater than 100,000 Escherichia coli, pansensitive. Stool for Clostridium difficile was negative. Urinalysis showed WBCs 1807 and 3+ bacteria.   Echocardiogram showed an ejection fraction of 60 to 65%, impaired relaxation of left ventricular diastolic filling, severely increased left ventricular posterior wall. Chest x-ray, portable, showed no acute  abnormality, chronic changes at the right base. MRI of the brain without contrast showed no restricting diffusion to suggest acute stroke or acute demyelinating lesion, extensive white matter disease, moderate to severe atrophy.   HOSPITAL COURSE: Please refer to H and P done by the admitting physician. The patient is a 69 year old white female, who was admitted by the hospital with difficulty with ambulation and weakness. The patient was seen in the ED and was noted to have low blood pressure, severe hypokalemia, acute kidney injury and a urinary tract infection. Due to these, she was admitted for aggressive antibiotics and IV fluids. With the treatments of these, the patient continued to be very lethargic and she had to be intubated for airway protection. She was on the ventilator and subsequently extubated. The patient is still weak, but is much improved. She was also thought to have an MS exacerbation and was treated with high-dose steroids, was seen by neurology, who made arrangements for her to see Dr. Malvin Johns on this Monday. The patient also had a urinary tract infection that was treated with antibiotics. She had a slightly elevated troponin, which was felt to be due to demand ischemia and was seen by cardiology. At this time, she is stable for discharge.   DISCHARGE MEDICATIONS: Tylenol 650 every 4 hours p.r.n. for pain, aspirin 81 mg 1 tablet p.o. daily, Colace 100 b.i.d. p.r.n., senna 1 tab p.o. b.i.d. p.r.n., simvastatin 40 daily, Norco 5/325, 1 to 2 tablets every 6 hours p.r.n. for moderate pain, lisinopril 10 daily, metoprolol 25 p.o. b.i.d., sliding scale insulin, Mirapex 0.5 t.i.d., diltiazem 180 daily, hydralazine 10 mg q.i.d., Seroquel 25 mg at bedtime.   ACTIVITY: As tolerated with PT evaluation and treatment.   DIET: Dysphagia 3, chopped, nectar thick, cup with  lunch and dinner tray.   DISCHARGE FOLLOWUP: With Dr. Malvin Johns of neurology this coming Monday, MD at the rehab facility in 1 to 2  weeks.   TIME SPENT ON THE DISCHARGE: 35 minutes  ____________________________ Serita Grit H. Allena Katz, MD shp:AT D: 10/18/2014 12:23:37 ET T: 10/18/2014 13:02:11 ET JOB#: [id number]  cc: Jahmarion Popoff H. Allena Katz, MD, <Dictator> Charise Carwin MD ELECTRONICALLY SIGNED 10/25/2014 14:14

## 2014-11-24 NOTE — Consult Note (Signed)
PATIENT NAME:  Brandi Gibbs, Brandi Gibbs MR#:  [id number] DATE OF BIRTH:  1946-04-11  DATE OF CONSULTATION:  10/09/2014  REFERRING PHYSICIAN:  Bronstein.  CONSULTING PHYSICIAN:  Marcina Millard, MD  CHIEF COMPLAINT: Inability to walk.   REASON FOR CONSULTATION: Requested for evaluation of bradycardia and elevated troponin.   HISTORY OF PRESENT ILLNESS: The patient is a 69 year old female with a history of multiple sclerosis. The patient presented with a 2 -3 day history of progressive inability to ambulate and generalized weakness. The patient did have low-grade fever. She was brought to the Emergency Room and the patient was unable to get up from the bathroom with urinary and fecal incontinence. In the Emergency Room, the patient was noted to be hypotensive, diagnosed with a urinary tract infection. The patient was initially bradycardic which is now resolved. Admission labs were notable for borderline elevated troponin of 0.12 and the absence of chest pain. Other notable admission labs included elevated BUN and creatinine at 52 and 1.58, respectively, and a serum potassium of 2.2.   PAST MEDICAL HISTORY:  1.  Multiple sclerosis. 2.  Cerebrovascular disease status post right carotid endarterectomy June 2010.  3.  History of paroxysmal atrial fibrillation. 4.  Ulcerative colitis.  5.  Hypertension.  6.  Chronic anemia.   MEDICATIONS: Amlodipine 5 mg daily, aspirin 81 mg daily, bisoprolol/HCTZ 5/6.25 daily, lisinopril 40 mg daily, acetaminophen/hydrocodone 500/5 q.i.d. p.r.n., Avonex 30 mcg intramuscularly weekly, vitamin D 1 b.i.d., ciprofloxacin, methimazole 10 mg 2 tablets b.i.d., Norco 1-2 tablets q. 6 p.r.n., oxybutynin 5 mg 2-3 times daily, multivitamin 1 daily, vitamin B complex 1 daily, vitamin B12 100 mcg daily, vitamin D3 1000 units b.i.d.   SOCIAL HISTORY: The patient lives with her husband. She smokes 4-5 cigarettes daily.   FAMILY HISTORY: No immediate family history of coronary artery  disease or myocardial infarction.   REVIEW OF SYSTEMS: CONSTITUTIONAL: The patient had some mild fever.  EYES: No blurry vision.  EARS: No hearing loss.  RESPIRATORY: No shortness of breath.  CARDIOVASCULAR: No chest pain.  GASTROINTESTINAL: The patient has had decreased p.o. intake.  GENITOURINARY: Recent urinary incontinence or at least inability to ambulate appropriately in the bathroom.  MUSCULOSKELETAL: No arthralgias or myalgias.  NEUROLOGICAL: The patient has had multiple sclerosis.   PHYSICAL EXAMINATION:  VITAL SIGNS: Blood pressure 122/78, pulse 60, respirations 18, temperature 97.9, pulse oximetry 98%.  HEENT: Pupils equal and reactive to light and accommodation.  NECK: Supple without thyromegaly.  LUNGS: Clear.  CARDIOVASCULAR: Normal JVP. Normal PMI. Regular rate and rhythm. Normal S1, S2. No appreciable gallop, murmur, or rub.  ABDOMEN: Soft and nontender. Pulses were intact bilaterally.  MUSCULOSKELETAL: Normal muscle tone.  NEUROLOGIC: The patient has had a long-standing history of multiple sclerosis.   IMPRESSION: A 69 year old female with a history of multiple sclerosis who presents with a probable urinary tract infection, inability to ambulate safely, initially with borderline elevated troponin, in the setting of acute on chronic kidney disease, hypokalemia, likely due to demand supply ischemia especially in the absence of chest pain. The patient also noted to be bradycardic on admission, was taking bisoprolol for hypertension, now heart rate has stabilized.   RECOMMENDATIONS:  1.  Agree with overall current therapy.  2.  Continue to hold bisoprolol.  3.  Defer full dose anticoagulation.  4.  Would defer further cardiac diagnostics at this time.   ____________________________ Marcina Millard, MD ap:sp D: 10/09/2014 13:43:20 ET T: 10/09/2014 15:07:51 ET JOB#: [id number]  cc: Marcina Millard, MD, <  Dictator>  Marcina Millard MD ELECTRONICALLY SIGNED  10/22/2014 12:48

## 2014-11-26 ENCOUNTER — Other Ambulatory Visit: Payer: Self-pay | Admitting: Family Medicine

## 2014-11-26 DIAGNOSIS — R7989 Other specified abnormal findings of blood chemistry: Secondary | ICD-10-CM

## 2014-12-02 ENCOUNTER — Ambulatory Visit
Admission: RE | Admit: 2014-12-02 | Discharge: 2014-12-02 | Disposition: A | Discharge status: Home / Self Care | Payer: Medicare Other | Source: Ambulatory Visit | Attending: Family Medicine | Admitting: Family Medicine

## 2014-12-02 DIAGNOSIS — R7989 Other specified abnormal findings of blood chemistry: Secondary | ICD-10-CM

## 2014-12-02 DIAGNOSIS — R946 Abnormal results of thyroid function studies: Secondary | ICD-10-CM | POA: Insufficient documentation

## 2014-12-06 ENCOUNTER — Emergency Department: Payer: Medicare Other

## 2014-12-06 ENCOUNTER — Inpatient Hospital Stay
Admission: EM | Admit: 2014-12-06 | Discharge: 2014-12-09 | DRG: 193 | Disposition: A | Discharge status: Skilled Nursing Facility | Payer: Medicare Other | Attending: Internal Medicine | Admitting: Internal Medicine

## 2014-12-06 DIAGNOSIS — E785 Hyperlipidemia, unspecified: Secondary | ICD-10-CM | POA: Diagnosis present

## 2014-12-06 DIAGNOSIS — J189 Pneumonia, unspecified organism: Principal | ICD-10-CM | POA: Diagnosis present

## 2014-12-06 DIAGNOSIS — G9340 Encephalopathy, unspecified: Secondary | ICD-10-CM | POA: Diagnosis present

## 2014-12-06 DIAGNOSIS — K219 Gastro-esophageal reflux disease without esophagitis: Secondary | ICD-10-CM | POA: Diagnosis present

## 2014-12-06 DIAGNOSIS — I10 Essential (primary) hypertension: Secondary | ICD-10-CM | POA: Diagnosis present

## 2014-12-06 DIAGNOSIS — G35 Multiple sclerosis: Secondary | ICD-10-CM | POA: Diagnosis present

## 2014-12-06 DIAGNOSIS — R410 Disorientation, unspecified: Secondary | ICD-10-CM

## 2014-12-06 DIAGNOSIS — I482 Chronic atrial fibrillation: Secondary | ICD-10-CM | POA: Diagnosis present

## 2014-12-06 DIAGNOSIS — M1990 Unspecified osteoarthritis, unspecified site: Secondary | ICD-10-CM | POA: Diagnosis present

## 2014-12-06 DIAGNOSIS — G2581 Restless legs syndrome: Secondary | ICD-10-CM | POA: Diagnosis present

## 2014-12-06 DIAGNOSIS — Z66 Do not resuscitate: Secondary | ICD-10-CM | POA: Diagnosis present

## 2014-12-06 DIAGNOSIS — R4182 Altered mental status, unspecified: Secondary | ICD-10-CM | POA: Diagnosis present

## 2014-12-06 DIAGNOSIS — J181 Lobar pneumonia, unspecified organism: Secondary | ICD-10-CM

## 2014-12-06 HISTORY — DX: Anxiety disorder, unspecified: F41.9

## 2014-12-06 HISTORY — DX: Allergy, unspecified, initial encounter: T78.40XA

## 2014-12-06 HISTORY — DX: Unspecified osteoarthritis, unspecified site: M19.90

## 2014-12-06 HISTORY — DX: Unspecified convulsions: R56.9

## 2014-12-06 HISTORY — DX: Myoneural disorder, unspecified: G70.9

## 2014-12-06 HISTORY — DX: Gastro-esophageal reflux disease without esophagitis: K21.9

## 2014-12-06 LAB — COMPREHENSIVE METABOLIC PANEL
ALBUMIN: 3.9 g/dL (ref 3.5–5.0)
ALT: 22 U/L (ref 14–54)
ANION GAP: 8 (ref 5–15)
AST: 35 U/L (ref 15–41)
Alkaline Phosphatase: 95 U/L (ref 38–126)
BUN: 10 mg/dL (ref 6–20)
CHLORIDE: 106 mmol/L (ref 101–111)
CO2: 29 mmol/L (ref 22–32)
Calcium: 9 mg/dL (ref 8.9–10.3)
Creatinine, Ser: 0.65 mg/dL (ref 0.44–1.00)
GFR calc non Af Amer: >60 mL/min (ref >60)
GLUCOSE: 116 mg/dL — AB (ref 65–99)
Potassium: 3.6 mmol/L (ref 3.5–5.1)
SODIUM: 143 mmol/L (ref 135–145)
TOTAL PROTEIN: 8 g/dL (ref 6.5–8.1)
Total Bilirubin: 0.6 mg/dL (ref 0.3–1.2)

## 2014-12-06 LAB — BLOOD GAS, ARTERIAL
ACID-BASE EXCESS: 3.8 mmol/L — AB (ref 0.0–3.0)
BICARBONATE: 25.7 meq/L (ref 21.0–28.0)
DRAWN BY: 347671
FIO2: 28 %
O2 SAT: 84.2 %
PCO2 ART: 30 mmHg — AB (ref 32.0–48.0)
PH ART: 7.54 — AB (ref 7.350–7.450)
PO2 ART: 42 mmHg — AB (ref 83.0–108.0)
Patient temperature: 37

## 2014-12-06 LAB — CBC
HEMATOCRIT: 38.6 % (ref 35.0–47.0)
HEMATOCRIT: 35.6 % (ref 35.0–47.0)
Hemoglobin: 12.1 g/dL (ref 12.0–16.0)
Hemoglobin: 11.5 g/dL — ABNORMAL LOW (ref 12.0–16.0)
MCH: 25.9 pg — ABNORMAL LOW (ref 26.0–34.0)
MCH: 26.4 pg (ref 26.0–34.0)
MCHC: 31.4 g/dL — ABNORMAL LOW (ref 32.0–36.0)
MCHC: 32.3 g/dL (ref 32.0–36.0)
MCV: 82.5 fL (ref 80.0–100.0)
MCV: 81.6 fL (ref 80.0–100.0)
PLATELETS: 299 10*3/uL (ref 150–440)
Platelets: 344 10*3/uL (ref 150–440)
RBC: 4.67 MIL/uL (ref 3.80–5.20)
RBC: 4.36 MIL/uL (ref 3.80–5.20)
RDW: 16 % — ABNORMAL HIGH (ref 11.5–14.5)
RDW: 15.8 % — AB (ref 11.5–14.5)
WBC: 8 10*3/uL (ref 3.6–11.0)
WBC: 7.9 10*3/uL (ref 3.6–11.0)

## 2014-12-06 LAB — CREATININE, SERUM
Creatinine, Ser: 0.72 mg/dL (ref 0.44–1.00)
GFR calc Af Amer: >60 mL/min (ref >60)
GFR calc non Af Amer: >60 mL/min (ref >60)

## 2014-12-06 LAB — URINALYSIS COMPLETE WITH MICROSCOPIC (ARMC ONLY)
BACTERIA UA: NONE SEEN
Bilirubin Urine: NEGATIVE
GLUCOSE, UA: NEGATIVE mg/dL
Hgb urine dipstick: NEGATIVE
Ketones, ur: NEGATIVE mg/dL
Leukocytes, UA: NEGATIVE
Nitrite: NEGATIVE
Protein, ur: 100 mg/dL — AB
SPECIFIC GRAVITY, URINE: 1.008 (ref 1.005–1.030)
SQUAMOUS EPITHELIAL / LPF: NONE SEEN
pH: 9 — ABNORMAL HIGH (ref 5.0–8.0)

## 2014-12-06 LAB — GLUCOSE, CAPILLARY
GLUCOSE-CAPILLARY: 117 mg/dL — AB (ref 65–99)
Glucose-Capillary: 124 mg/dL — ABNORMAL HIGH (ref 65–99)

## 2014-12-06 LAB — LACTIC ACID, PLASMA: Lactic Acid, Venous: 1.2 mmol/L (ref 0.5–2.0)

## 2014-12-06 LAB — PROTIME-INR
INR: 0.95
PROTHROMBIN TIME: 12.9 s (ref 11.4–15.0)

## 2014-12-06 LAB — AMMONIA: Ammonia: <9 umol/L — ABNORMAL LOW (ref 9–35)

## 2014-12-06 MED ORDER — ACETAMINOPHEN 650 MG RE SUPP: 650.0000 mg | Freq: Four times a day (QID) | RECTAL | Status: DC | PRN

## 2014-12-06 MED ORDER — METOPROLOL TARTRATE 25 MG PO TABS
25.0000 mg | ORAL_TABLET | Freq: Two times a day (BID) | ORAL | Status: DC
  Administered 2014-12-06 – 2014-12-09 (×6): 25 mg via ORAL
  Filled 2014-12-06 (×6): qty 1

## 2014-12-06 MED ORDER — AMLODIPINE BESYLATE 10 MG PO TABS
10.0000 mg | ORAL_TABLET | Freq: Every day | ORAL | Status: DC
  Administered 2014-12-07: 07:00:00 10 mg via ORAL
  Filled 2014-12-06 (×3): qty 1

## 2014-12-06 MED ORDER — PRAMIPEXOLE DIHYDROCHLORIDE 0.25 MG PO TABS
0.5000 mg | ORAL_TABLET | Freq: Three times a day (TID) | ORAL | Status: DC
  Filled 2014-12-06 (×2): qty 2

## 2014-12-06 MED ORDER — PIPERACILLIN-TAZOBACTAM 3.375 G IVPB
INTRAVENOUS | Status: AC
  Administered 2014-12-06: 3.375 g via INTRAVENOUS
  Filled 2014-12-06: qty 50

## 2014-12-06 MED ORDER — DILTIAZEM HCL ER 180 MG PO CP24
180.0000 mg | ORAL_CAPSULE | Freq: Every day | ORAL | Status: DC
  Administered 2014-12-07 – 2014-12-08 (×2): 180 mg via ORAL
  Filled 2014-12-06 (×7): qty 1

## 2014-12-06 MED ORDER — PIPERACILLIN-TAZOBACTAM 3.375 G IVPB 30 MIN
3.3750 g | Freq: Three times a day (TID) | INTRAVENOUS | Status: DC
  Administered 2014-12-06 – 2014-12-08 (×7): 3.375 g via INTRAVENOUS
  Filled 2014-12-06 (×9): qty 50

## 2014-12-06 MED ORDER — SODIUM CHLORIDE 0.9 % IV SOLN
INTRAVENOUS | Status: DC
  Administered 2014-12-06: 21:00:00 via INTRAVENOUS

## 2014-12-06 MED ORDER — ACETAMINOPHEN 325 MG PO TABS
650.0000 mg | ORAL_TABLET | Freq: Four times a day (QID) | ORAL | Status: DC | PRN
  Administered 2014-12-08: 650 mg via ORAL
  Filled 2014-12-06 (×2): qty 2

## 2014-12-06 MED ORDER — PIPERACILLIN-TAZOBACTAM 3.375 G IVPB
3.3750 g | Freq: Once | INTRAVENOUS | Status: AC
  Administered 2014-12-06: 3.375 g via INTRAVENOUS

## 2014-12-06 MED ORDER — PIPERACILLIN-TAZOBACTAM 4.5 G IVPB
4.5000 g | Freq: Three times a day (TID) | INTRAVENOUS | Status: DC
  Filled 2014-12-06 (×3): qty 100

## 2014-12-06 MED ORDER — SIMVASTATIN 40 MG PO TABS
40.0000 mg | ORAL_TABLET | Freq: Every day | ORAL | Status: DC
  Administered 2014-12-06 – 2014-12-09 (×4): 40 mg via ORAL
  Filled 2014-12-06 (×4): qty 1

## 2014-12-06 MED ORDER — LISINOPRIL 5 MG PO TABS
5.0000 mg | ORAL_TABLET | Freq: Every day | ORAL | Status: DC
  Administered 2014-12-07: 08:00:00 5 mg via ORAL
  Filled 2014-12-06 (×2): qty 1

## 2014-12-06 MED ORDER — PIPERACILLIN SOD-TAZOBACTAM SO 2.25 (2-0.25) G IV SOLR
75.0000 mg/kg | Freq: Three times a day (TID) | INTRAVENOUS | Status: DC
  Filled 2014-12-06 (×2): qty 5.16

## 2014-12-06 MED ORDER — HYDRALAZINE HCL 10 MG PO TABS
10.0000 mg | ORAL_TABLET | Freq: Four times a day (QID) | ORAL | Status: DC
  Administered 2014-12-06 – 2014-12-07 (×3): 10 mg via ORAL
  Filled 2014-12-06 (×5): qty 1

## 2014-12-06 MED ORDER — SODIUM CHLORIDE 0.9 % IV BOLUS (SEPSIS)
1000.0000 mL | Freq: Once | INTRAVENOUS | Status: AC
  Administered 2014-12-06: 1000 mL via INTRAVENOUS

## 2014-12-06 MED ORDER — ONDANSETRON HCL 4 MG PO TABS: 4.0000 mg | ORAL_TABLET | Freq: Four times a day (QID) | ORAL | Status: DC | PRN

## 2014-12-06 MED ORDER — VANCOMYCIN HCL IN DEXTROSE 750-5 MG/150ML-% IV SOLN
750.0000 mg | Freq: Two times a day (BID) | INTRAVENOUS | Status: DC
  Administered 2014-12-06 – 2014-12-07 (×3): 750 mg via INTRAVENOUS
  Filled 2014-12-06 (×6): qty 150

## 2014-12-06 MED ORDER — PRAMIPEXOLE DIHYDROCHLORIDE 0.25 MG PO TABS
0.5000 mg | ORAL_TABLET | Freq: Three times a day (TID) | ORAL | Status: DC
  Administered 2014-12-07 (×3): 0.5 mg via ORAL
  Filled 2014-12-06 (×8): qty 2

## 2014-12-06 MED ORDER — ALUM & MAG HYDROXIDE-SIMETH 200-200-20 MG/5ML PO SUSP: 30.0000 mL | Freq: Four times a day (QID) | ORAL | Status: DC | PRN

## 2014-12-06 MED ORDER — VANCOMYCIN HCL IN DEXTROSE 1-5 GM/200ML-% IV SOLN
1000.0000 mg | Freq: Once | INTRAVENOUS | Status: AC
  Administered 2014-12-06: 1000 mg via INTRAVENOUS
  Filled 2014-12-06: qty 200

## 2014-12-06 MED ORDER — OXYBUTYNIN CHLORIDE 5 MG PO TABS
5.0000 mg | ORAL_TABLET | Freq: Four times a day (QID) | ORAL | Status: DC
  Administered 2014-12-06 – 2014-12-09 (×11): 5 mg via ORAL
  Filled 2014-12-06 (×11): qty 1

## 2014-12-06 MED ORDER — HEPARIN SODIUM (PORCINE) 5000 UNIT/ML IJ SOLN
5000.0000 [IU] | Freq: Three times a day (TID) | INTRAMUSCULAR | Status: DC
  Administered 2014-12-06 – 2014-12-09 (×9): 5000 [IU] via SUBCUTANEOUS
  Filled 2014-12-06 (×10): qty 1

## 2014-12-06 MED ORDER — BISOPROLOL-HYDROCHLOROTHIAZIDE 5-6.25 MG PO TABS
1.0000 | ORAL_TABLET | Freq: Every day | ORAL | Status: DC
  Administered 2014-12-07 – 2014-12-08 (×2): 1 via ORAL
  Filled 2014-12-06 (×5): qty 1

## 2014-12-06 MED ORDER — ONDANSETRON HCL 4 MG/2ML IJ SOLN: 4.0000 mg | Freq: Four times a day (QID) | INTRAMUSCULAR | Status: DC | PRN

## 2014-12-06 NOTE — ED Notes (Signed)
Pt placed on 2L [place] per Dr Fanny Bien request. Pt intermittently drops down to 87% on RA, back up to 94%. No RR distress, color WNL. Family at bedside. Pt oxygen now 98% on 2L [place]

## 2014-12-06 NOTE — ED Notes (Signed)
Patient transported to CT 

## 2014-12-06 NOTE — ED Provider Notes (Addendum)
----------------------------------------- 12:21 PM on 12/06/2014 -----------------------------------------  Reviewed patient's previous discharge summary from the end of March. Of note the patient had urinary tract infection, altered mental status, MS flare and required intubation at that time.  [facility] Emergency Department Provider Note  ____________________________________________  Time seen: Approximately 12:22 PM  I have reviewed the triage vital signs and the nursing notes.   HISTORY  Chief Complaint Altered Mental Status    HPI Brandi Gibbs is a 69 y.o. female who became confused last night. Per the patient's husband she is normal and conversant until the evening when she became fatigued. She then slowly seemed to get more confused. He notes that last night she had some trouble with her pill minder and they have taken 1 or 2 extra baclofen tabletshe continued to observe her, but today she did not want to get out of bed, was very tired, and was clearly confused.  Husband states the patient will May say "fine" when asked questions.  History physical and review of systems are limited to that which the husband can provide because the patient has altered sensorium, confusion. EM caveat  Past Medical History  Diagnosis Date  . Multiple sclerosis   . Movement disorder   . Hypertension   . Vision abnormalities   . Restless leg syndrome     Patient Active Problem List   Diagnosis Date Noted  . Ataxic gait 09/11/2014  . Cognitive changes 09/11/2014  . Other fatigue 09/11/2014  . Urinary hesitancy 09/11/2014  . Spasticity 09/11/2014  . Midline low back pain without sciatica 09/11/2014  . Insomnia 09/11/2014  . Restless leg 09/11/2014  . Snoring 09/11/2014  . DS (disseminated sclerosis) 03/28/2014  . Osteopenia 03/28/2014  . Hyperthyroidism 03/28/2014  . BP (high blood pressure) 03/28/2014  . HLD (hyperlipidemia) 03/28/2014    Past  Surgical History  Procedure Laterality Date  . Partial hysterectomy    . Back surgery      Current Outpatient Rx  Name  Route  Sig  Dispense  Refill  . amLODipine (NORVASC) 5 MG tablet      TAKE 1 TABLET EVERY DAY         . atenolol-chlorthalidone (TENORETIC) 50-25 MG per tablet      TAKE 1 TABLET EVERY DAY         . baclofen (LIORESAL) 10 MG tablet   Oral   Take 1 tablet (10 mg total) by mouth 3 (three) times daily.   90 each   11   . HYDROcodone-acetaminophen (NORCO/VICODIN) 5-325 MG per tablet   Oral   Take 1 tablet by mouth 2 (two) times daily as needed for moderate pain.   60 tablet   0   . lisinopril (PRINIVIL,ZESTRIL) 40 MG tablet      TAKE 1 TABLET EVERY DAY         . methimazole (TAPAZOLE) 10 MG tablet      TAKE ONE TABLET TWICE A DAY         . oxybutynin (DITROPAN) 5 MG tablet   Oral   Take 1 tablet (5 mg total) by mouth 2 (two) times daily.   60 tablet   11   . pramipexole (MIRAPEX) 0.5 MG tablet   Oral   Take 1 tablet (0.5 mg total) by mouth 3 (three) times daily.   90 tablet   11   . pramipexole (MIRAPEX) 0.5 MG tablet   Oral   Take 1 tablet (0.5 mg total) by  mouth 3 (three) times daily.   90 tablet   11   . tamsulosin (FLOMAX) 0.4 MG CAPS capsule   Oral   Take 1 capsule (0.4 mg total) by mouth daily.   30 capsule   11   . Teriflunomide 14 MG TABS   Oral   Take 14 mg by mouth daily.           Allergies Other; Prednisone; and Omeprazole  Family History  Problem Relation Age of Onset  . Leukemia Mother   . Healthy Father     Social History History  Substance Use Topics  . Smoking status: Never Smoker   . Smokeless tobacco: Not on file  . Alcohol Use: 0.0 oz/week    0 Standard drinks or equivalent per week     Comment: rare/fim    Review of Systems Constitutional: No fever/chills Husband states he has not noticed any pain. Has been no cough.  No nausea, no vomiting.  No diarrhea.  No  constipation. Genitourinary: Negative for dysuria. Musculoskeletal: Negative for back pain. Skin: Negative for rash.  Husband states patient has MS is very weak at baseline and she is generally weak in the legs and arms, and her  her face and drooping over 20 years.   10-point ROS otherwise negative.  ____________________________________________   PHYSICAL EXAM:  VITAL SIGNS: ED Triage Vitals  Enc Vitals Group     BP 12/06/14 1159 170/70 mmHg     Pulse Rate 12/06/14 1159 89     Resp 12/06/14 1159 14     Temp 12/06/14 1159 98.3 F (36.8 C)     Temp Source 12/06/14 1159 Oral     SpO2 12/06/14 1159 96 %     Weight 12/06/14 1159 152 lb (68.947 kg)     Height 12/06/14 1159  (1.651 m)     Head Cir --      Peak Flow --      Pain Score --      Pain Loc --      Pain Edu? --      Excl. in GC? --     Constitutional: Frail and generally ill appearing. In no acute distress. Eyes: Conjunctivae are normal. PERRL.  Head: Atraumatic. Nose: No congestion/rhinnorhea. Mouth/Throat: Mucous membranes are moist.  Oropharynx non-erythematous. Neck: No stridor.  Cardiovascular: Normal rate, regular rhythm. Grossly normal heart sounds.  Good peripheral circulation. Respiratory: Normal respiratory effort.  No retractions. Lungs CTAB. Gastrointestinal: Soft and nontender. No distention. No abdominal bruits. No CVA tenderness. Musculoskeletal: No lower extremity tenderness and mild bilateral lower extremity edema.  No joint effusions. Neurologic:  Patient will only answer "fine" to all questions. She is unable to answer orientation questions. On neurologic exam she does follow basic one and two-step commands and is able to move all arms and legs, all of which appear generally weak. The patient does have a facial droop, per patient's husband this is chronic. Skin:  Skin is warm, dry and intact. No rash noted. Psychiatric: Mood and affect are normal. Speech and behavior are  normal.  ____________________________________________   LABS (all labs ordered are listed, but only abnormal results are displayed)  Labs Reviewed  CBC - Abnormal; Notable for the following:    MCH 25.9 (*)    MCHC 31.4 (*)    RDW 16.0 (*)    All other components within normal limits  COMPREHENSIVE METABOLIC PANEL - Abnormal; Notable for the following:    Glucose, Bld 116 (*)  All other components within normal limits  URINALYSIS COMPLETEWITH MICROSCOPIC ([facility])  - Abnormal; Notable for the following:    Color, Urine STRAW (*)    APPearance CLEAR (*)    pH 9.0 (*)    Protein, ur 100 (*)    All other components within normal limits  GLUCOSE, CAPILLARY - Abnormal; Notable for the following:    Glucose-Capillary 124 (*)    All other components within normal limits  AMMONIA - Abnormal; Notable for the following:    Ammonia <9 (*)    All other components within normal limits  CULTURE, BLOOD (ROUTINE X 2)  CULTURE, BLOOD (ROUTINE X 2)  LACTIC ACID, PLASMA  PROTIME-INR  LACTIC ACID, PLASMA  CBG MONITORING, ED   ____________________________________________  EKG   Date: 12/06/2014  Rate: 80  Rhythm: normal sinus rhythm  QRS Axis: normal  Intervals: normal  ST/T Wave abnormalities: normal  Conduction Disutrbances: none  Narrative Interpretation: unremarkable, except for slight prolongation of the QT at 485     ____________________________________________  RADIOLOGY  I personally reviewed the x-ray and agree with the radiologist's recommendation.  CT Head Wo Contrast (Final result) Result time: 12/06/14 13:38:08   Final result by Rad Results In Interface (12/06/14 13:38:08)   Narrative:   CLINICAL DATA: Altered mental status. Delirium.  EXAM: CT HEAD WITHOUT CONTRAST  TECHNIQUE: Contiguous axial images were obtained from the base of the skull through the vertex without intravenous contrast.  COMPARISON: 10/11/2014  FINDINGS: The brain shows  generalized atrophy. There is extensive chronic small vessel ischemic change throughout the white matter. 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 The brainstem shows extensive small-vessel ischemic change. No evidence of acute infarction, mass lesion, hemorrhage, hydrocephalus or extra-axial collection. No calvarial abnormality. No inflammatory sinus disease. There is atherosclerotic calcification of the major vessels at the base of the brain.  IMPRESSION: No acute or reversible finding. Extensive small vessel ischemic changes throughout the brain as outlined above.   Electronically Signed By: Paulina Fusi M.D. On: 12/06/2014 13:38          DG Chest Port 1 View (Final result) Result time: 12/06/14 13:30:50   Final result by Rad Results In Interface (12/06/14 13:30:50)   Narrative:   CLINICAL DATA: Altered mental status  EXAM: PORTABLE CHEST - 1 VIEW  COMPARISON: October 14, 2014  FINDINGS: There is focal airspace consolidation in the right base. Lungs elsewhere clear. Heart is upper normal in size with pulmonary vascularity within normal limits. No adenopathy. There is atherosclerotic change in the aorta.  IMPRESSION: Focal airspace disease right base, most consistent with pneumonia. Aspiration is a differential consideration. Lungs elsewhere clear. No change in cardiac silhouette. Atherosclerotic change in aorta.     ____________________________________________   PROCEDURES  Procedure(s) performed: None  Critical Care performed: No  ____________________________________________   INITIAL IMPRESSION / ASSESSMENT AND PLAN / ED COURSE  Pertinent labs & imaging results that were available during my care of the patient were reviewed by me and considered in my medical decision making (see chart for details).  Patient presents with acute confusion and a picture that most likely indicates acute delirium. Patient  developed symptoms last evening including some confusion. At the present time she is unable to answer any orientation questions. Also of note the patient has occasional periods of bradypnea and will desat into the mid 80s. We will place her on nasal cannula. We'll perform a [place] of workup including CT head, chest x-ray,  UA, basic labs, obtain ammonia.  I anticipate based on the patient's severe changes in mental status that the patient will require inpatient hospitalization tonight. Will continue to workup for etiology in the emergency department.  ----------------------------------------- 2:49 PM on 12/06/2014 -----------------------------------------  Patient remains stable at this time, hypoxia has resolved on 2 L nasal cannula. Discussed with the family. Evaluation and imaging with delirium and hypoxia is likely related to a new right lower lobe pneumonia.  Because the patient was recently hospitalized and ventilated I will start her on broad-spectrum antibiotics and admit the patient to the hospital for further evaluation.  ----------------------------------------- 3:01 PM on 12/06/2014 -----------------------------------------  At the present time patient states file signs remained stable, she does have ongoing oxygen requirement. Her mental status is slightly worse. At the present time she is able to smile, laughs during conversation, winces, and opens/closes eyes. Discussed with the family, including the patient's husband, the patient is DO NOT RESUSCITATE however they are amenable to intubation should her mental status continued to worsen. They would not want her heart restarted or CPR performed, but again they would be amenable to her being in the intensive care unit if she needed intubation. ____________________________________________   FINAL CLINICAL IMPRESSION(S) / ED DIAGNOSES  Final diagnoses:  None   pneumonia, right lower lobe. Hospital acquired.  Hypoxia, initial,  acute, improved Delirium    Sharyn Creamer, MD 12/06/14 1450  Sharyn Creamer, MD 12/06/14 [phone]

## 2014-12-06 NOTE — Progress Notes (Signed)
Notified Dr Allena Katz by phone that pt with AMS unknown if took BP meds at home today. Currently BP 157/57, HR 84. MD verbalized to give tonight only metoprolol and hydralazyne bedtime dose.

## 2014-12-06 NOTE — ED Notes (Signed)
MD at bedside. 

## 2014-12-06 NOTE — H&P (Signed)
[facility] -  at [facility]   PATIENT NAME: Brandi Gibbs    MR#:  [id number]  DATE OF BIRTH:  1946-04-27  DATE OF ADMISSION:  12/06/2014  PRIMARY CARE PHYSICIAN: Dorothey Baseman, MD   REQUESTING/REFERRING PHYSICIAN: Dr. Fanny Bien  CHIEF COMPLAINT:  Altered mental status  HISTORY OF PRESENT ILLNESS:  Brandi Gibbs  is a 69 y.o. female with a known history of multiple sclerosis, chronic atrial fibrillation, and restless leg syndrome who presents via EMS with above complaint. According to the family who is at bedside since yesterday afternoon patient had increasing confusion. The husband thinks that she may have taken too many pills of her baclofen thinking that it was her medication for restless leg syndrome. Since that time she has been confused. It lasted through the morning so they were concerned and brought her to the ER for further evaluation. In the emergency department she is lethargic with slight confusion however according to the family she has improved dramatically.  PAST MEDICAL HISTORY:   Past Medical History  Diagnosis Date  . Multiple sclerosis   . Movement disorder   . Hypertension   . Vision abnormalities   . Restless leg syndrome     PAST SURGICAL HISTOIRY:   Past Surgical History  Procedure Laterality Date  . Partial hysterectomy    . Back surgery      SOCIAL HISTORY:   History  Substance Use Topics  . Smoking status: Never Smoker   . Smokeless tobacco: Not on file  . Alcohol Use: 0.0 oz/week    0 Standard drinks or equivalent per week     Comment: rare/fim    FAMILY HISTORY:   Family History  Problem Relation Age of Onset  . Leukemia Mother   . Healthy Father     DRUG ALLERGIES:   Allergies  Allergen Reactions  . Other Other (See Comments)    Blood thinners - GI bleed  . Prednisone Other (See Comments)    Causes hair to fall out   . Omeprazole Rash    REVIEW OF SYSTEMS:  Unable to obtain a review of  systems as the patient is lethargic.  MEDICATIONS AT HOME:   Prior to Admission medications   Medication Sig Start Date End Date Taking? Authorizing Provider  amLODipine (NORVASC) 5 MG tablet TAKE 1 TABLET EVERY DAY 05/06/14  Yes Historical Provider, MD  baclofen (LIORESAL) 10 MG tablet Take 1 tablet (10 mg total) by mouth 3 (three) times daily. 09/11/14  Yes Asa Lente, MD  bisoprolol-hydrochlorothiazide [facility]) 5-6.25 MG per tablet Take 1 tablet by mouth daily.   Yes Historical Provider, MD  diltiazem (DILACOR XR) 180 MG 24 hr capsule Take 180 mg by mouth daily.   Yes Historical Provider, MD  hydrALAZINE (APRESOLINE) 10 MG tablet Take 10 mg by mouth 4 (four) times daily.   Yes Historical Provider, MD  lisinopril (PRINIVIL,ZESTRIL) 40 MG tablet TAKE 1 TABLET EVERY DAY 04/11/14  Yes Historical Provider, MD  metoprolol tartrate (LOPRESSOR) 25 MG tablet Take 25 mg by mouth 2 (two) times daily.   Yes Historical Provider, MD  oxybutynin (DITROPAN) 5 MG tablet Take 1 tablet (5 mg total) by mouth 2 (two) times daily. Patient taking differently: Take 5 mg by mouth 4 (four) times daily.  09/11/14  Yes Asa Lente, MD  pramipexole (MIRAPEX) 0.5 MG tablet Take 1 tablet (0.5 mg total) by mouth 3 (three) times daily. 09/11/14  Yes Asa Lente, MD  simvastatin (  ZOCOR) 40 MG tablet Take 40 mg by mouth daily.   Yes Historical Provider, MD  HYDROcodone-acetaminophen (NORCO/VICODIN) 5-325 MG per tablet Take 1 tablet by mouth 2 (two) times daily as needed for moderate pain. Patient not taking: Reported on 12/06/2014 09/11/14   Asa Lente, MD  pramipexole (MIRAPEX) 0.5 MG tablet Take 1 tablet (0.5 mg total) by mouth 3 (three) times daily. Patient not taking: Reported on 12/06/2014 09/11/14   Asa Lente, MD  tamsulosin (FLOMAX) 0.4 MG CAPS capsule Take 1 capsule (0.4 mg total) by mouth daily. Patient not taking: Reported on 12/06/2014 09/11/14   Asa Lente, MD      VITAL SIGNS:  Blood  pressure 172/65, pulse 85, temperature 98.3 F (36.8 C), temperature source Oral, resp. rate 16, height  (1.651 m), weight 68.947 kg (152 lb), SpO2 94 %.  PHYSICAL EXAMINATION:  GENERAL:  69 y.o.-year-old patient lying in the bed with no acute distress , lethargic wearing nasal cannula.  EYES: Pupils equal, round, reactive to light and accommodation. No scleral icterus. Extraocular muscles intact.  HEENT: Head atraumatic, normocephalic. Oropharynx and nasopharynx clear.  NECK:  Supple, no jugular venous distention. No thyroid enlargement, no tenderness.  LUNGS: Normal breath sounds bilaterally, no wheezing, rales,rhonchi or crepitation. No use of accessory muscles of respiration.  CARDIOVASCULAR: S1, S2 normal. No murmurs, rubs, or gallops.  ABDOMEN: Soft, nontender, nondistended. Bowel sounds present. No organomegaly or mass.  EXTREMITIES: No pedal edema, cyanosis, or clubbing.  NEUROLOGIC: Patient is lethargic occasionally responds to my questions. She does follow some commands. She is able to move all extremities.  PSYCHIATRIC: The patient is alert and oriented x 3.  SKIN: No obvious rash, lesion, or ulcer. Skin is warm and dry  LABORATORY PANEL:   CBC  Recent Labs Lab 12/06/14 1203  WBC 8.0  HGB 12.1  HCT 38.6  PLT 344   ------------------------------------------------------------------------------------------------------------------  Chemistries   Recent Labs Lab 12/06/14 1203  NA 143  K 3.6  CL 106  CO2 29  GLUCOSE 116*  BUN 10  CREATININE 0.65  CALCIUM 9.0  AST 35  ALT 22  ALKPHOS 95  BILITOT 0.6   ------------------------------------------------------------------------------------------------------------------  Cardiac Enzymes No results for input(s): TROPONINI in the last 168 hours. ------------------------------------------------------------------------------------------------------------------  RADIOLOGY:  Ct Head Wo Contrast  12/06/2014     IMPRESSION: No acute or reversible finding. Extensive small vessel ischemic changes throughout the brain as outlined above.   Electronically Signed   By: Paulina Fusi M.D.   On: 12/06/2014 13:38   Dg Chest Port 1 View  12/06/2014     IMPRESSION: Focal airspace disease right base, most consistent with pneumonia. Aspiration is a differential consideration. Lungs elsewhere clear. No change in cardiac silhouette. Atherosclerotic change in aorta.   Electronically Signed   By: Bretta Bang III M.D.   On: 12/06/2014 13:30    EKG:   Orders placed or performed during the hospital encounter of 12/06/14  . ED EKG  . ED EKG  . EKG 12-Lead  . EKG 12-Lead  NSR no ST elevation or depression  IMPRESSION AND PLAN:  This is 68 year old female with a past medical history significant for multiple sclerosis and chronic atrial fibrillation who presented to the emergency department with altered mental status.  1. Altered mental state: Patient's mental status as per the family is clearing. She remains lethargic however does respond occasionally to commands. I suspect the etiology is medications. However, she also has a right middle  lobe pneumonia on chest x-ray. I am holding baclofen and I will  treat for healthcare acquired pneumonia/aspiration pneumonia. Her initial head CT in the emergency department was negative for any acute etiology. I will continue neuro checks every 4 hours.  2. Healthcare acquired pneumonia: Blood cultures were ordered in the emergency department. I will will continue vancomycin and Zosyn. Pharmacy will help with stenoses medications appropriate. She should have a speech evaluation given her history of aspiration and location of this pneumonia.  3. Essential hypertension: Patient will continue on outpatient medications I will continue Norvasc, Ziac, hydralazine, lisinopril, metoprolol, and diltiazem.  4. Atrial fibrillation: Patient's heart rate is currently controlled. It does not  appear that she is on anticoagulation. She had an EGD in 2012 which showed gastritis. I will continue diltiazem and metoprolol.   All the records are reviewed and case discussed with ED provider. Management plans discussed with the patient and she is in agreement.  CODE STATUS: DNR    TOTAL TIME TAKING CARE OF THIS PATIENT: 45 minutes.    Rakeya Glab M.D on 12/06/2014 at 3:35 PM  Between 7am to 6pm - Pager - [phone] After 6pm go to [email or web address] - password [email or web address] [facility]  [facility]  Office  [phone]  CC: Primary care physician; Dorothey Baseman, MD

## 2014-12-06 NOTE — Progress Notes (Signed)
ANTIBIOTIC CONSULT NOTE - FOLLOW UP  Pharmacy Consult for Vancomycin and Zosyn Indication: pneumonia  Allergies  Allergen Reactions  . Other Other (See Comments)    Blood thinners - GI bleed  . Prednisone Other (See Comments)    Causes hair to fall out   . Omeprazole Rash    Patient Measurements: Height:  (165.1 cm) Weight: 152 lb (68.947 kg) IBW/kg (Calculated) : 57 Adjusted Body Weight: 61.7 kg  Vital Signs: Temp: 98.4 F (36.9 C) (05/13 1946) Temp Source: Oral (05/13 1946) BP: 157/57 mmHg (05/13 1946) Pulse Rate: 84 (05/13 1946) Intake/Output from previous day:   Intake/Output from this shift:    Labs:  Recent Labs  12/06/14 1203 12/06/14 1807  WBC 8.0 7.9  HGB 12.1 11.5*  PLT 344 299  CREATININE 0.65 0.72   Estimated Creatinine Clearance: 65.7 mL/min (by C-G formula based on Cr of 0.72). No results for input(s): VANCOTROUGH, VANCOPEAK, VANCORANDOM, GENTTROUGH, GENTPEAK, GENTRANDOM, TOBRATROUGH, TOBRAPEAK, TOBRARND, AMIKACINPEAK, AMIKACINTROU, AMIKACIN in the last 72 hours.   Microbiology: No results found for this or any previous visit (from the past 720 hour(s)).  Anti-infectives    Start     Dose/Rate Route Frequency Ordered Stop   12/06/14 2300  piperacillin-tazo (ZOSYN) NICU IV syringe 200 mg/mL  Status:  Discontinued     75 mg/kg  68.9 kg 51.6 mL/hr over 30 Minutes Intravenous Every 8 hours 12/06/14 1656 12/06/14 1711   12/06/14 2300  vancomycin (VANCOCIN) IVPB 750 mg/150 ml premix     750 mg 150 mL/hr over 60 Minutes Intravenous Every 12 hours 12/06/14 1945     12/06/14 2200  piperacillin-tazobactam (ZOSYN) IVPB 4.5 g  Status:  Discontinued     4.5 g 25 mL/hr over 240 Minutes Intravenous 3 times per day 12/06/14 1711 12/06/14 1945   12/06/14 2200  piperacillin-tazobactam (ZOSYN) IVPB 3.375 g     3.375 g 12.5 mL/hr over 240 Minutes Intravenous 3 times per day 12/06/14 1945     12/06/14 1500  vancomycin (VANCOCIN) IVPB 1000 mg/200 mL premix      1,000 mg 200 mL/hr over 60 Minutes Intravenous  Once 12/06/14 1451 12/06/14 1643   12/06/14 1500  piperacillin-tazobactam (ZOSYN) IVPB 3.375 g     3.375 g 12.5 mL/hr over 240 Minutes Intravenous  Once 12/06/14 1451 12/06/14 1910      Assessment: Patient admitted for pneumonia. Ordered Vancomycin 1g IV once in the ED and Zosyn 3.375g IV once in the ED.   Ke=0.059, t1/2=11.7hr, Vd=48.23  Will order Zosyn 3.375g IV q8h EI and Vancomycin  IV q12h (will start 7hr after initial 1g dose for stacked dosing)  Goal of Therapy:  Vancomycin trough level 15-20 mcg/ml  Plan:  Measure antibiotic drug levels at steady state Follow up culture results  Clovia Cuff, PharmD, BCPS 12/06/2014 7:54 PM

## 2014-12-06 NOTE — ED Notes (Signed)
Sent from home by EMS. Husband states pt has AMS. Discharged from rehab at [facility] last week. Pt strong smell of urine. Unsure if pt took too many muscle relaxers from report of husband per EMS. Pt alert to place and self only. Disoriented to situation and time.

## 2014-12-07 MED ORDER — SODIUM CHLORIDE 0.9 % IJ SOLN
3.0000 mL | INTRAMUSCULAR | Status: DC | PRN
  Administered 2014-12-07 – 2014-12-08 (×4): 3 mL via INTRAVENOUS
  Filled 2014-12-07 (×4): qty 10

## 2014-12-07 MED ORDER — AZITHROMYCIN 250 MG PO TABS
500.0000 mg | ORAL_TABLET | Freq: Every day | ORAL | Status: AC
  Administered 2014-12-07: 500 mg via ORAL
  Filled 2014-12-07 (×2): qty 2

## 2014-12-07 MED ORDER — BACLOFEN 10 MG PO TABS
10.0000 mg | ORAL_TABLET | Freq: Three times a day (TID) | ORAL | Status: DC
  Administered 2014-12-07 – 2014-12-09 (×7): 10 mg via ORAL
  Filled 2014-12-07 (×7): qty 1

## 2014-12-07 MED ORDER — AZITHROMYCIN 250 MG PO TABS
250.0000 mg | ORAL_TABLET | Freq: Every day | ORAL | Status: DC
  Administered 2014-12-08 – 2014-12-09 (×2): 250 mg via ORAL
  Filled 2014-12-07 (×2): qty 1

## 2014-12-07 MED ORDER — SODIUM CHLORIDE 0.9 % IJ SOLN
3.0000 mL | Freq: Three times a day (TID) | INTRAMUSCULAR | Status: DC
  Administered 2014-12-07 – 2014-12-08 (×7): 3 mL via INTRAVENOUS

## 2014-12-07 NOTE — Progress Notes (Signed)
Patient ID: Brandi Gibbs, female   DOB: 1946-03-17, 69 y.o.   MRN: [id number] [facility] PROGRESS NOTE  Brandi Gibbs:[id number] DOB: [birth year] DOA: 12/06/2014 PCP: Dorothey Baseman, MD  HPI/Subjective: As per her husband, the patient may have gotten a hold of her Mirapex and baclofen and taken to many pills secondary to pain. Her mental status is better today. She was found to have pneumonia on chest x-ray. No complaints of cough or shortness of breath.  Objective: Filed Vitals:   12/07/14 0625  BP: 180/80  Pulse: 74  Temp:   Resp:     Intake/Output Summary (Last 24 hours) at 12/07/14 0835 Last data filed at 12/07/14 0800  Gross per 24 hour  Intake   2305 ml  Output    250 ml  Net   2055 ml   Filed Weights   12/06/14 1159 12/06/14 1946  Weight: 68.947 kg (152 lb) 66.497 kg (146 lb 9.6 oz)    ROS: Review of Systems  Constitutional: Negative for fever and chills.  Eyes: Negative for blurred vision.  Respiratory: Negative for cough and shortness of breath.   Cardiovascular: Negative for chest pain.  Gastrointestinal: Negative for nausea, vomiting, diarrhea and constipation.  Genitourinary: Negative for dysuria.  Musculoskeletal: Negative for joint pain.  Neurological: Negative for dizziness and headaches.   Exam: Physical Exam  HENT:  Nose: No mucosal edema.  Mouth/Throat: No oropharyngeal exudate or posterior oropharyngeal edema.  Eyes: Conjunctivae, EOM and lids are normal. Pupils are equal, round, and reactive to light.  Neck: No JVD present. Carotid bruit is not present. No edema present. No thyroid mass and no thyromegaly present.  Cardiovascular: S1 normal and S2 normal.  Exam reveals no gallop.   No murmur heard. Pulses:      Dorsalis pedis pulses are 2+ on the right side, and 2+ on the left side.  Respiratory: No respiratory distress. She has no wheezes. She has no rhonchi. She has no rales.  GI: Soft. Bowel sounds are normal. There is no  tenderness.  Lymphadenopathy:    She has no cervical adenopathy.  Neurological: She is alert. No cranial nerve deficit.  Skin: Skin is warm. No rash noted. Nails show no clubbing.  Psychiatric: She has a normal mood and affect.   Data Reviewed: Basic Metabolic Panel:  Recent Labs Lab 12/06/14 1203 12/06/14 1807  NA 143  --   K 3.6  --   CL 106  --   CO2 29  --   GLUCOSE 116*  --   BUN 10  --   CREATININE 0.65 0.72  CALCIUM 9.0  --    Liver Function Tests:  Recent Labs Lab 12/06/14 1203  AST 35  ALT 22  ALKPHOS 95  BILITOT 0.6  PROT 8.0  ALBUMIN 3.9    Recent Labs Lab 12/06/14 1301  AMMONIA <9*   CBC:  Recent Labs Lab 12/06/14 1203 12/06/14 1807  WBC 8.0 7.9  HGB 12.1 11.5*  HCT 38.6 35.6  MCV 82.5 81.6  PLT 344 299   CBG:  Recent Labs Lab 12/06/14 1211 12/06/14 1603  GLUCAP 124* 117*    No results found for this or any previous visit (from the past 240 hour(s)).   Studies: Ct Head Wo Contrast  12/06/2014   CLINICAL DATA:  Altered mental status.  Delirium.  EXAM: CT HEAD WITHOUT CONTRAST  TECHNIQUE: Contiguous axial images were obtained from the base of the skull through the vertex without intravenous  contrast.  COMPARISON:  10/11/2014  FINDINGS: The brain shows generalized atrophy. There is extensive chronic small vessel ischemic change throughout the white matter. 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 The brainstem shows extensive small-vessel ischemic change. No evidence of acute infarction, mass lesion, hemorrhage, hydrocephalus or extra-axial collection. No calvarial abnormality. No inflammatory sinus disease. There is atherosclerotic calcification of the major vessels at the base of the brain.  IMPRESSION: No acute or reversible finding. Extensive small vessel ischemic changes throughout the brain as outlined above.   Electronically Signed   By: Paulina Fusi M.D.   On: 12/06/2014 13:38   Dg Chest Port 1  View  12/06/2014   CLINICAL DATA:  Altered mental status  EXAM: PORTABLE CHEST - 1 VIEW  COMPARISON:  October 14, 2014  FINDINGS: There is focal airspace consolidation in the right base. Lungs elsewhere clear. Heart is upper normal in size with pulmonary vascularity within normal limits. No adenopathy. There is atherosclerotic change in the aorta.  IMPRESSION: Focal airspace disease right base, most consistent with pneumonia. Aspiration is a differential consideration. Lungs elsewhere clear. No change in cardiac silhouette. Atherosclerotic change in aorta.   Electronically Signed   By: Bretta Bang III M.D.   On: 12/06/2014 13:30    Scheduled Meds: . amLODipine  10 mg Oral Daily  . baclofen  10 mg Oral TID  . bisoprolol-hydrochlorothiazide  1 tablet Oral Daily  . diltiazem  180 mg Oral Daily  . heparin  5,000 Units Subcutaneous 3 times per day  . hydrALAZINE  10 mg Oral QID  . lisinopril  5 mg Oral Daily  . metoprolol tartrate  25 mg Oral BID  . oxybutynin  5 mg Oral QID  . piperacillin-tazobactam  3.375 g Intravenous 3 times per day  . pramipexole  0.5 mg Oral TID  . simvastatin  40 mg Oral Daily  . vancomycin  750 mg Intravenous Q12H   Continuous Infusions:   Assessment/Plan: Active Problems:   Altered mental state   1. Acute encephalopathy. This seems improved this morning. She may have taken too much of her baclofen and Mirapex. Continue to watch closely. 2. Pneumonia in the right lower lobe. The patient was put on aggressive antibiotics on admission. Not sure if I'll need the aggressiveness of the antibiotics but I will continue today. Need to cover atypicals, so I will add either Zithromax or doxycycline. 3. Multiple sclerosis- I will get a physical therapy consultation. Restart baclofen. 4. Accelerated hypertension- continue usual medications for now and continue to monitor. 5. Hyperlipidemia unspecified- continue simvastatin.  Code Status:     Code Status Orders         Start     Ordered   12/06/14 1656  Do not attempt resuscitation (DNR)   Continuous    Question Answer Comment  In the event of cardiac or respiratory ARREST Do not call a "code blue"   In the event of cardiac or respiratory ARREST Do not perform Intubation, CPR, defibrillation or ACLS   In the event of cardiac or respiratory ARREST Use medication by any route, position, wound care, and other measures to relive pain and suffering. May use oxygen, suction and manual treatment of airway obstruction as needed for comfort.      12/06/14 1656    Advance Directive Documentation        Most Recent Value   Type of Advance Directive  Living will   Pre-existing out of  facility DNR order (yellow form or pink MOST form)     "MOST" Form in Place?       Family Communication: Husband at bedside. Disposition Plan: Likely home  Time spent: 25 minutes  Alford Highland  [facility]

## 2014-12-07 NOTE — Plan of Care (Signed)
Problem: Discharge Progression Outcomes Goal: Other Discharge Outcomes/Goals Outcome: Progressing Progress to goal: 1. Altered mental status-recognizes husband, continued confused. Generalized weakness. 02 sats stable on 02. IV anbibiotics continued. Denies pain. Tolerating diet with limited appetite. Remained at bedrest except up with PT with postural BP changes. 1400 Hydralazine held. Husband met with discharge planner.

## 2014-12-07 NOTE — Progress Notes (Signed)
PT is recommending SNF. Case Management will continue to follow to assist with any DME or home health needs if Brandi Gibbs is not discharged to a SNF.

## 2014-12-07 NOTE — Progress Notes (Signed)
Notified Dr. Betti Cruz of BP 180/80. Dr. Betti Cruz verbalized to give amlodipine 1000 am dose now.

## 2014-12-07 NOTE — Progress Notes (Signed)
TC to Dr. Hilton Sinclair with antibiotic orders verified to continue oral and all IV antibiotics- advised to continue all ordered antibiotics.

## 2014-12-07 NOTE — Evaluation (Signed)
Physical Therapy Evaluation Patient Details Name: Brandi Gibbs MRN: [id number] DOB: Jun 14, 1946 Today's Date: 12/07/2014   History of Present Illness  Pt is a 69yo white female who presented to ED wth CC weakness, AMS, and fatigue. Pt has PMH of MS aFib, and was recently at [facility] for UTI, afterwhich she DC to rehab and returned to prior functional level.   Clinical Impression  Pt is A&Ox3 upon entry with family in room. Pt is slow to respond to questions and quite lethargic, but family is quick to jump in answer for her and assist with history. Pt at baseline requires moderate assistance with most ADL, but is able to perform Household ambulation at Super[facility] to [facility]. Pt currently presenting with significant fatigue and malaise. Patient presents with impairment of strength, pain, range of motion, and activity tolerance, limiting ability to perform ADL, IADL, and ambulation. Patient will benefit from skilled intervention to address the above impairments and limitations, in order to restore to prior level of function and to decrease caregiver burden.      Follow Up Recommendations SNF;Supervision for mobility/OOB    Equipment Recommendations  Rolling walker with 5" wheels    Recommendations for Other Services       Precautions / Restrictions Precautions Precautions: Fall Restrictions Weight Bearing Restrictions: No      Mobility  Bed Mobility Overal bed mobility: +2 for physical assistance                Transfers Overall transfer level: Needs assistance Equipment used: Rolling walker (2 wheeled) Transfers: Sit to/from Stand Sit to Stand: Mod assist         General transfer comment: Unable to bring hips forward; losing balance backward for 60+ seconds despite attempts to correct.   Ambulation/Gait Ambulation/Gait assistance:  (Unable at this time. )              Stairs            Wheelchair Mobility    Modified Rankin (Stroke Patients Only)        Balance Overall balance assessment: Needs assistance Sitting-balance support: Bilateral upper extremity supported Sitting balance-Leahy Scale: Poor (poor endurance in postural muscles. )   Postural control: Left lateral lean;Posterior lean Standing balance support: Bilateral upper extremity supported Standing balance-Leahy Scale: Zero                               Pertinent Vitals/Pain Pain Assessment: No/denies pain    Home Living Family/patient expects to be discharged to:: Private residence Living Arrangements: Spouse/significant other Available Help at Discharge: Family Type of Home: House Home Access: Stairs to enter Entrance Stairs-Rails: Right Entrance Stairs-Number of Steps: 3 Home Layout: One level Home Equipment: Environmental consultant - 2 wheels;Wheelchair - power;Wheelchair - manual;Cane - single point Arts administrator )      Prior Function Level of Independence: Needs assistance   Gait / Transfers Assistance Needed: Typically with modified indep to minA with fluctuation MS symptoms.   ADL's / Homemaking Assistance Needed: ModA with bathing, toiletting, and dressing. Able to self feed.         Hand Dominance        Extremity/Trunk Assessment   Upper Extremity Assessment: Generalized weakness (More proximal weakness than distal, and weaker on R than left. Delayed voluntary contraction. )           Lower Extremity Assessment: Generalized weakness (Weaker on R than L with foot drop  contractures on R)      Cervical / Trunk Assessment: Kyphotic  Communication   Communication: Other (comment) (Very slow, lethargic responses. 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)   PT Problem List Decreased strength;Decreased cognition;Impaired tone;Decreased range of motion;Decreased activity tolerance;Decreased balance;Decreased mobility;Impaired sensation  PT Treatment Interventions Balance training;Gait training;Stair training;Functional mobility training;Therapeutic activities;Therapeutic exercise;Patient/family education   PT Goals (Current goals can be found in the Care Plan section) Acute Rehab PT Goals Patient Stated Goal: STR stay to regain functional strength  PT Goal Formulation: With patient/family Time For Goal Achievement: 12/21/14 Potential to Achieve Goals: Good    Frequency Min 2X/week   Barriers to discharge        Co-evaluation               End of Session Equipment Utilized During Treatment: Gait belt;Oxygen Activity Tolerance: Patient tolerated treatment well;Patient limited by lethargy Patient left: in bed;with call bell/phone within reach;with bed alarm set;with nursing/sitter in room;with family/visitor present Nurse Communication: Mobility status;Precautions         Time: 1610-9604 PT Time Calculation (min) (ACUTE ONLY): 34 min   Charges:   PT Evaluation $Initial PT Evaluation Tier I: 1 Procedure PT Treatments $Therapeutic Activity: 23-37 mins   PT G Codes:        Buccola,Allan C 12-30-14, 2:00 PM  Rosamaria Lints, PT, DPT, BM

## 2014-12-07 NOTE — Plan of Care (Signed)
Problem: Discharge Progression Outcomes Goal: Discharge plan in place and appropriate Individualization: Address pt as Brandi Gibbs. H/o HTN, AF, restless leg syndrome controlled with home meds. High fall risk-Incontinent. Check toileting needs qx1hr with safety checks. Goal: Other Discharge Outcomes/Goals Outcome: Progressing Plan of Care Progress to Goal: Afebrile. BP/HR stable. O2 sats in the high 90's on 2L O2 per [place]. Pt alert to self and place. Lethargic, but easily aroused. Denies pain. ABX, IVF.

## 2014-12-08 MED ORDER — PRAMIPEXOLE DIHYDROCHLORIDE 0.25 MG PO TABS
0.5000 mg | ORAL_TABLET | Freq: Every day | ORAL | Status: DC
  Administered 2014-12-08: 0.5 mg via ORAL
  Filled 2014-12-08: qty 2

## 2014-12-08 NOTE — Plan of Care (Signed)
Problem: Discharge Progression Outcomes Goal: Other Discharge Outcomes/Goals Outcome: Progressing Progress to Plan of Care Goal: 1. Aspiration precautions observed. Encouraged DB and coughing with pt following command. 2.02 stable at 2l/. No respiratory issues. 3. Primarily bedrest with generalized weakness; BP meds adjusted due to postural changes with PT and lower BP readings lying. 4. MD advised pt and husband today that she would need rehab at discharge time wth pt and husband agreeable. 5. Continues IV antibiotics and po azithromycin. 6. Tolerating diet well; needs feeding assistance due to weakness. 7. Altered mental status continues- more alert, connected at times, but forgetful and unable to manage self care.

## 2014-12-08 NOTE — Clinical Social Work Note (Signed)
Clinical Social Work Assessment  Patient Details  Name: Brandi Gibbs MRN: [id number] Date of Birth: 1945/08/03  Date of referral:  12/08/14               Reason for consult:  Facility Placement                Permission sought to share information with:  Family Supports, Oceanographer granted to share information::  Yes, Verbal Permission Granted  Name::        Agency::  [place] SNFs  Relationship::     Contact Information:     Housing/Transportation Living arrangements for the past 2 months:  Single Family Home Source of Information:  Spouse Patient Interpreter Needed:  None Criminal Activity/Legal Involvement Pertinent to Current Situation/Hospitalization:  No - Comment as needed Significant Relationships:  Spouse, Adult Children Lives with:  Spouse Do you feel safe going back to the place where you live?  Yes Need for family participation in patient care:  Yes (Comment)  Care giving concerns:     Social Worker assessment / plan:  Pt sleeping at time of visit. Pt with previous stay at [facility] for approx 20 days, per pt husband. CSW reviewed placement process and answered questions. Pt's husband requesting [facility] for this SNF stay. FL2 complete and SNF search initiated. Weekday CSW to f/u with offers.   Employment status:  Retired Health and safety inspector:  Medicare PT Recommendations:  Skilled Nursing Facility Information / Referral to community resources:  Skilled Nursing Facility  Patient/Family's Response to care:  Pt's husband reports agreeable to above plan. Pt sleeping at time of visit. Per RN, pt not oriented to situation.  Patient/Family's Understanding of and Emotional Response to Diagnosis, Current Treatment, and Prognosis:    Emotional Assessment Appearance:    Attitude/Demeanor/Rapport:  Unable to Assess (sleeping) Affect (typically observed):  Unable to Assess (sleeping) Orientation:  Oriented to Self,  Oriented to Place Alcohol / Substance use:  Not Applicable Psych involvement (Current and /or in the community):  No (Comment)  Discharge Needs  Concerns to be addressed:    Readmission within the last 30 days:  No Current discharge risk:    Barriers to Discharge:  No Barriers Identified   Deatra Robinson, LCSW 12/08/2014, 1:09 PM

## 2014-12-08 NOTE — Plan of Care (Signed)
Problem: Discharge Progression Outcomes Goal: Barriers To Progression Addressed/Resolved Individualization: Address pt as Brandi Gibbs. H/o MS, HTN, AF, restless leg syndrome controlled with home meds. High fall risk-Incontinent. Check toileting needs qx1hr with safety checks.    Goal: Other Discharge Outcomes/Goals Outcome: Progressing Plan of Care Progress to Goal: Barriers: Pt alert to self and place. Lethargic, but easily aroused.  O2 saturation: O2 sats in the high >92% on 2L O2 per [place] Pain: No c/o pain  Hemo Stable: WBC 7.9; IV ABX infused per order. Diet: tolerating PO intake, no c/o nausea or vomiting High fall risk, with bed alarm activated and hourly safety rounds. Pt denies pain or discomfort this shift.

## 2014-12-08 NOTE — Clinical Social Work Placement (Signed)
   CLINICAL SOCIAL WORK PLACEMENT  NOTE  Date:  12/08/2014  Patient Details  Name: Brandi Gibbs MRN: [id number] Date of Birth: [birth year]  Clinical Social Work is seeking post-discharge placement for this patient at the Skilled  Nursing Facility level of care (*CSW will initial, date and re-position this form in  chart as items are completed):  Yes   Patient/family provided with [facility] Clinical Social Work Department's list of facilities offering this level of care within the geographic area requested by the patient (or if unable, by the patient's family).  Yes   Patient/family informed of their freedom to choose among providers that offer the needed level of care, that participate in Medicare, Medicaid or managed care program needed by the patient, have an available bed and are willing to accept the patient.  Yes   Patient/family informed of [facility]'s ownership interest in [facility] and [facility], as well as of the fact that they are under no obligation to receive care at these facilities.  PASRR submitted to EDS on       PASRR number received on       Existing PASRR number confirmed on 12/08/14     FL2 transmitted to all facilities in geographic area requested by pt/family on 12/08/14     FL2 transmitted to all facilities within larger geographic area on       Patient informed that his/her managed care company has contracts with or will negotiate with certain facilities, including the following:            Patient/family informed of bed offers received.  Patient chooses bed at       Physician recommends and patient chooses bed at      Patient to be transferred to   on  .  Patient to be transferred to facility by       Patient family notified on   of transfer.  Name of family member notified:        PHYSICIAN       Additional Comment:    _______________________________________________ Deatra Robinson, LCSW 12/08/2014, 1:13 PM

## 2014-12-08 NOTE — Plan of Care (Signed)
Problem: Discharge Progression Outcomes Goal: Barriers To Progression Addressed/Resolved Outcome: Progressing Individulization" 1. Lives to be called Brandi Gibbs. 2.Lives at home with elderly husband with walks with cane who is primary care giver. 3.Incontinent of bladder and bowel. 4. High risk with history of frequent falls.  Med history includes pneumonia, UTI, MS, chronic at.fib, HTN.

## 2014-12-08 NOTE — Progress Notes (Signed)
Patient ID: Brandi Gibbs, female   DOB: Nov 28, 1945, 69 y.o.   MRN: [id number] [facility] PROGRESS NOTE  Brandi Gibbs:[id number] DOB: 1946-02-22 DOA: 12/06/2014 PCP: Brandi Baseman, MD  HPI/Subjective: Patient feeling tired this morning just woke up from sleep. She feels a little bit better. Not much of a cough or shortness of breath. Still on oxygen this morning. Still weak.  Objective: Filed Vitals:   12/08/14 0510  BP: 149/53  Pulse: 67  Temp: 98.1 F (36.7 C)  Resp: 20    Intake/Output Summary (Last 24 hours) at 12/08/14 0843 Last data filed at 12/08/14 0114  Gross per 24 hour  Intake    395 ml  Output      0 ml  Net    395 ml   Filed Weights   12/06/14 1159 12/06/14 1946  Weight: 68.947 kg (152 lb) 66.497 kg (146 lb 9.6 oz)    ROS: Review of Systems  Constitutional: Negative for fever and chills.  Eyes: Negative for blurred vision.  Respiratory: Negative for cough and shortness of breath.   Cardiovascular: Negative for chest pain.  Gastrointestinal: Negative for nausea, vomiting, diarrhea and constipation.  Genitourinary: Negative for dysuria.  Musculoskeletal: Negative for joint pain.  Neurological: Negative for dizziness and headaches.   Exam: Physical Exam  HENT:  Nose: No mucosal edema.  Mouth/Throat: No oropharyngeal exudate or posterior oropharyngeal edema.  Eyes: Conjunctivae, EOM and lids are normal. Pupils are equal, round, and reactive to light.  Neck: No JVD present. Carotid bruit is not present. No edema present. No thyroid mass and no thyromegaly present.  Cardiovascular: S1 normal and S2 normal.  Exam reveals no gallop.   No murmur heard. Pulses:      Dorsalis pedis pulses are 2+ on the right side, and 2+ on the left side.  Respiratory: No respiratory distress. She has no wheezes. She has no rhonchi. She has no rales.  GI: Soft. Bowel sounds are normal. There is no tenderness.  Lymphadenopathy:    She has no cervical  adenopathy.  Neurological: She is alert. No cranial nerve deficit.  Skin: Skin is warm. No rash noted. Nails show no clubbing.  Psychiatric: She has a normal mood and affect.   Data Reviewed: Basic Metabolic Panel:  Recent Labs Lab 12/06/14 1203 12/06/14 1807  NA 143  --   K 3.6  --   CL 106  --   CO2 29  --   GLUCOSE 116*  --   BUN 10  --   CREATININE 0.65 0.72  CALCIUM 9.0  --    Liver Function Tests:  Recent Labs Lab 12/06/14 1203  AST 35  ALT 22  ALKPHOS 95  BILITOT 0.6  PROT 8.0  ALBUMIN 3.9    Recent Labs Lab 12/06/14 1301  AMMONIA <9*   CBC:  Recent Labs Lab 12/06/14 1203 12/06/14 1807  WBC 8.0 7.9  HGB 12.1 11.5*  HCT 38.6 35.6  MCV 82.5 81.6  PLT 344 299   CBG:  Recent Labs Lab 12/06/14 1211 12/06/14 1603  GLUCAP 124* 117*    Recent Results (from the past 240 hour(s))  Culture, blood (routine x 2)     Status: None (Preliminary result)   Collection Time: 12/06/14  1:01 PM  Result Value Ref Range Status   Specimen Description BLOOD  Final   Special Requests LEFT ANTECUBITAL  Final   Culture NO GROWTH 2 DAYS  Final   Report Status PENDING  Incomplete  Culture, blood (routine x 2)     Status: None (Preliminary result)   Collection Time: 12/06/14  1:01 PM  Result Value Ref Range Status   Specimen Description BLOOD  Final   Special Requests HAND  Final   Culture NO GROWTH 2 DAYS  Final   Report Status PENDING  Incomplete     Studies: Ct Head Wo Contrast  12/06/2014   CLINICAL DATA:  Altered mental status.  Delirium.  EXAM: CT HEAD WITHOUT CONTRAST  TECHNIQUE: Contiguous axial images were obtained from the base of the skull through the vertex without intravenous contrast.  COMPARISON:  10/11/2014  FINDINGS: The brain shows generalized atrophy. There is extensive chronic small vessel ischemic change throughout the white matter. 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 The brainstem shows extensive  small-vessel ischemic change. No evidence of acute infarction, mass lesion, hemorrhage, hydrocephalus or extra-axial collection. No calvarial abnormality. No inflammatory sinus disease. There is atherosclerotic calcification of the major vessels at the base of the brain.  IMPRESSION: No acute or reversible finding. Extensive small vessel ischemic changes throughout the brain as outlined above.   Electronically Signed   By: Paulina Fusi M.D.   On: 12/06/2014 13:38   Dg Chest Port 1 View  12/06/2014   CLINICAL DATA:  Altered mental status  EXAM: PORTABLE CHEST - 1 VIEW  COMPARISON:  October 14, 2014  FINDINGS: There is focal airspace consolidation in the right base. Lungs elsewhere clear. Heart is upper normal in size with pulmonary vascularity within normal limits. No adenopathy. There is atherosclerotic change in the aorta.  IMPRESSION: Focal airspace disease right base, most consistent with pneumonia. Aspiration is a differential consideration. Lungs elsewhere clear. No change in cardiac silhouette. Atherosclerotic change in aorta.   Electronically Signed   By: Bretta Bang III M.D.   On: 12/06/2014 13:30    Scheduled Meds: . amLODipine  10 mg Oral Daily  . azithromycin  250 mg Oral Daily  . baclofen  10 mg Oral TID  . bisoprolol-hydrochlorothiazide  1 tablet Oral Daily  . diltiazem  180 mg Oral Daily  . heparin  5,000 Units Subcutaneous 3 times per day  . hydrALAZINE  10 mg Oral QID  . lisinopril  5 mg Oral Daily  . metoprolol tartrate  25 mg Oral BID  . oxybutynin  5 mg Oral QID  . piperacillin-tazobactam  3.375 g Intravenous 3 times per day  . pramipexole  0.5 mg Oral TID  . simvastatin  40 mg Oral Daily  . sodium chloride  3 mL Intravenous Q8H  . vancomycin  750 mg Intravenous Q12H   Continuous Infusions:   Assessment/Plan: Active Problems:   Altered mental state   1. Acute encephalopathy. She may have taken too much of her baclofen and Mirapex. Continue to watch closely.  Baclofen restarted yesterday. 2. Pneumonia in the right lower lobe. The patient was put on aggressive antibiotics on admission. I will discontinue vancomycin. Continue Zosyn and Zithromax. 3. Multiple sclerosis- baclofen. 4. Accelerated hypertension- blood pressure better this morning on usual medications. 5. Hyperlipidemia unspecified- continue simvastatin. 6. Physical therapy recommended rehabilitation.  Code Status:     Code Status Orders        Start     Ordered   12/06/14 1656  Do not attempt resuscitation (DNR)   Continuous    Question Answer Comment  In the event of cardiac or respiratory ARREST Do not call a "code  blue"   In the event of cardiac or respiratory ARREST Do not perform Intubation, CPR, defibrillation or ACLS   In the event of cardiac or respiratory ARREST Use medication by any route, position, wound care, and other measures to relive pain and suffering. May use oxygen, suction and manual treatment of airway obstruction as needed for comfort.      12/06/14 1656    Advance Directive Documentation        Most Recent Value   Type of Advance Directive  Living will   Pre-existing out of facility DNR order (yellow form or pink MOST form)     "MOST" Form in Place?       Family Communication: Husband at bedside. Disposition Plan: Likely to rehabilitation.  Time spent: 25 minutes  Alford Highland  [facility]

## 2014-12-09 MED ORDER — AMLODIPINE BESYLATE 5 MG PO TABS
5.0000 mg | ORAL_TABLET | Freq: Once | ORAL | Status: AC
  Administered 2014-12-09: 08:00:00 5 mg via ORAL
  Filled 2014-12-09: qty 1

## 2014-12-09 MED ORDER — AMOXICILLIN-POT CLAVULANATE 875-125 MG PO TABS: 1.0000 | ORAL_TABLET | Freq: Two times a day (BID) | ORAL | Status: AC

## 2014-12-09 MED ORDER — AZITHROMYCIN 250 MG PO TABS: ORAL_TABLET | ORAL | Status: DC

## 2014-12-09 MED ORDER — LISINOPRIL 20 MG PO TABS: 40.0000 mg | ORAL_TABLET | Freq: Every day | ORAL | Status: DC

## 2014-12-09 MED ORDER — POLYETHYLENE GLYCOL 3350 17 G PO PACK
17.0000 g | PACK | Freq: Every day | ORAL | Status: DC
  Administered 2014-12-09: 17 g via ORAL
  Filled 2014-12-09: qty 1

## 2014-12-09 MED ORDER — HYDROCHLOROTHIAZIDE 12.5 MG PO CAPS: 12.5000 mg | ORAL_CAPSULE | Freq: Every day | ORAL | Status: DC

## 2014-12-09 MED ORDER — PRAMIPEXOLE DIHYDROCHLORIDE 0.5 MG PO TABS: 0.5000 mg | ORAL_TABLET | Freq: Every day | ORAL | Status: DC

## 2014-12-09 MED ORDER — AMOXICILLIN-POT CLAVULANATE 875-125 MG PO TABS
1.0000 | ORAL_TABLET | Freq: Two times a day (BID) | ORAL | Status: DC
  Administered 2014-12-09: 1 via ORAL
  Filled 2014-12-09: qty 1

## 2014-12-09 MED ORDER — OXYBUTYNIN CHLORIDE 5 MG PO TABS: 5.0000 mg | ORAL_TABLET | Freq: Four times a day (QID) | ORAL | Status: DC

## 2014-12-09 MED ORDER — HYDRALAZINE HCL 10 MG PO TABS
10.0000 mg | ORAL_TABLET | Freq: Four times a day (QID) | ORAL | Status: DC
Start: 2014-12-09 — End: 2014-12-09

## 2014-12-09 NOTE — Clinical Social Work Placement (Signed)
   CLINICAL SOCIAL WORK PLACEMENT  NOTE  Date:  12/09/2014  Patient Details  Name: Brandi Gibbs MRN: [id number] Date of Birth: [birth year]  Clinical Social Work is seeking post-discharge placement for this patient at the Skilled  Nursing Facility level of care (*CSW will initial, date and re-position this form in  chart as items are completed):  Yes   Patient/family provided with [facility] Clinical Social Work Department's list of facilities offering this level of care within the geographic area requested by the patient (or if unable, by the patient's family).  Yes   Patient/family informed of their freedom to choose among providers that offer the needed level of care, that participate in Medicare, Medicaid or managed care program needed by the patient, have an available bed and are willing to accept the patient.  Yes   Patient/family informed of [facility]'s ownership interest in [facility] and [facility], as well as of the fact that they are under no obligation to receive care at these facilities.  PASRR submitted to EDS on       PASRR number received on       Existing PASRR number confirmed on 12/08/14     FL2 transmitted to all facilities in geographic area requested by pt/family on 12/08/14     FL2 transmitted to all facilities within larger geographic area on       Patient informed that his/her managed care company has contracts with or will negotiate with certain facilities, including the following:        Yes   Patient/family informed of bed offers received.  Patient chooses bed at [facility]     Physician recommends and patient chooses bed at      Patient to be transferred to [facility] on 12/09/14.  Patient to be transferred to facility by EMS     Patient family notified on 12/09/14 of transfer.  Name of family member notified:  Husband      PHYSICIAN       Additional Comment:     _______________________________________________ Ned Card, LCSW 12/09/2014, 12:16 PM

## 2014-12-09 NOTE — Care Management (Signed)
Admitted to [facility] with the diagnosis of altered mental status. Lives with husband, Roe Coombs. Husband is at the bedside. [facility] in the past. Sees Dr. Terance Hart. Last seen about 2 months ago. [facility] in the past.  Bed search for skilled nursing facility. Spoke with Dr. Renae Gloss. Possible discharge today. Gwenette Greet RN MSN Care Management [phone]

## 2014-12-09 NOTE — Care Management (Signed)
Brandi Gibbs unable to transfer to [facility]. Will be discharged to home today. Nurse, physical therapy, an aide ordered per Dr. Renae Gloss. Chose [facility]. Feliberto Gottron, representative for [facility] updated. Gwenette Greet RN MSN Care Management [phone]

## 2014-12-09 NOTE — Plan of Care (Signed)
Problem: Discharge Progression Outcomes Goal: Barriers To Progression Addressed/Resolved Outcome: Progressing Address pt as Brandi Gibbs. H/o MS, HTN, AF, restless leg syndrome controlled with home meds. High fall risk-Incontinent. Check toileting needs qx1hr with safety checks.        Goal: Other Discharge Outcomes/Goals Outcome: Progressing Progress to Plan of Care Goal: -Encouraged DB and coughing with pt following command. -Oxygen at 2l/[place], O2 sats remain >92% -Primarily bedrest with generalized weakness, lethargic but easily aroused; BP elevated this shift.  -IV antibiotics given per order.  -Tolerating diet well; no nausea or vomiting this shift; needs feeding assistance due to weakness

## 2014-12-09 NOTE — Discharge Instructions (Signed)
Confusion Confusion is the inability to think with your usual speed or clarity. Confusion may come on quickly or slowly over time. How quickly the confusion comes on depends on the cause. Confusion can be due to any number of causes. CAUSES   Concussion, head injury, or head trauma.  Seizures.  Stroke.  Fever.  Brain tumor.  Age related decreased brain function (dementia).  Heightened emotional states like rage or terror.  Mental illness in which the person loses the ability to determine what is real and what is not (hallucinations).  Infections such as a urinary tract infection (UTI).  Toxic effects from alcohol, drugs, or prescription medicines.  Dehydration and an imbalance of salts in the body (electrolytes).  Lack of sleep.  Low blood sugar (diabetes).  Low levels of oxygen from conditions such as chronic lung disorders.  Drug interactions or other medicine side effects.  Nutritional deficiencies, especially niacin, thiamine, vitamin C, or vitamin B.  Sudden drop in body temperature (hypothermia).  Change in routine, such as when traveling or hospitalized. SIGNS AND SYMPTOMS  People often describe their thinking as cloudy or unclear when they are confused. Confusion can also include feeling disoriented. That means you are unaware of where or who you are. You may also not know what the date or time is. If confused, you may also have difficulty paying attention, remembering, and making decisions. Some people also act aggressively when they are confused.  DIAGNOSIS  The medical evaluation of confusion may include:  Blood and urine tests.  X-rays.  Brain and nervous system tests.  Analyzing your brain waves (electroencephalogram or EEG).  Magnetic resonance imaging (MRI) of your head.  Computed tomography (CT) scan of your head.  Mental status tests in which your health care provider may ask many questions. Some of these questions may seem silly or strange,  but they are a very important test to help diagnose and treat confusion. TREATMENT  An admission to the hospital may not be needed, but a person with confusion should not be left alone. Stay with a family member or friend until the confusion clears. Avoid alcohol, pain relievers, or sedative drugs until you have fully recovered. Do not drive until directed by your health care provider. HOME CARE INSTRUCTIONS  What family and friends can do:  To find out if someone is confused, ask the person to state his or her name, age, and the date. If the person is unsure or answers incorrectly, he or she is confused.  Always introduce yourself, no matter how well the person knows you.  Often remind the person of his or her location.  Place a calendar and clock near the confused person.  Help the person with his or her medicines. You may want to use a pill box, an alarm as a reminder, or give the person each dose as prescribed.  Talk about current events and plans for the day.  Try to keep the environment calm, quiet, and peaceful.  Make sure the person keeps follow-up visits with his or her health care provider. PREVENTION  Ways to prevent confusion:  Avoid alcohol.  Eat a balanced diet.  Get enough sleep.  Take medicine only as directed by your health care provider.  Do not become isolated. Spend time with other people and make plans for your days.  Keep careful watch on your blood sugar levels if you are diabetic. SEEK IMMEDIATE MEDICAL CARE IF:   You develop severe headaches, repeated vomiting, seizures, blackouts, or   slurred speech.  There is increasing confusion, weakness, numbness, restlessness, or personality changes.  You develop a loss of balance, have marked dizziness, feel uncoordinated, or fall.  You have delusions, hallucinations, or develop severe anxiety.  Your family members think you need to be rechecked. Document Released: 08/19/2004 Document Revised: 11/26/2013  Document Reviewed: 08/17/2013 Exit[employer] Patient Information 2015 [employer]. This information is not intended to replace advice given to you by your health care provider. Make sure you discuss any questions you have with your health care provider.  

## 2014-12-09 NOTE — Progress Notes (Signed)
Dr. Betti Cruz notified of patient elevated BP this shift, last BP reading 187/55, new order for Norvasc 5mg  PO once received from Dr. Betti Cruz.

## 2014-12-09 NOTE — Clinical Social Work Note (Signed)
CSW met with pt and family to address question about copays at [facility]. CSW provided information [employer] that pt will owe $1127/week due to daily copays of $161. Pt and family are unable to pay and have opted to take pt home with home health services. CSW updated RNCM and MD. CSW is signing off as no further needs identified.   Darden Dates, MSW, LCSW Clinical Social Worker  [phone]

## 2014-12-09 NOTE — Discharge Summary (Addendum)
[facility] [place] at [facility]   PATIENT NAME: Brandi Gibbs    MR#:  [id number]  DATE OF BIRTH:  [birth year]  DATE OF ADMISSION:  12/06/2014 ADMITTING PHYSICIAN: Adrian Saran, MD  DATE OF DISCHARGE: 12/09/2014 PRIMARY CARE PHYSICIAN: Dorothey Baseman, MD    ADMISSION DIAGNOSIS:  Delirium [R41.0] Healthcare-associated pneumonia [J18.9] Right lower lobe pneumonia [J18.9]  DISCHARGE DIAGNOSIS:  Active Problems:   Altered mental state   SECONDARY DIAGNOSIS:   Past Medical History  Diagnosis Date  . Multiple sclerosis   . Movement disorder   . Hypertension   . Vision abnormalities   . Restless leg syndrome   . Allergy   . Anxiety   . Arthritis   . GERD (gastroesophageal reflux disease)   . Neuromuscular disorder   . Seizures   . Thyroid disease     HOSPITAL COURSE:   The patient was admitted found to have a pneumonia. Patient was started on aggressive antibiotics. Patient's mental status had improved from admission. Patient's husband states that she may have taken too many baclofen and Mirapex neck caused her altered mental status. The patient will be discharged to rehabilitation on Augmentin and Zithromax to finish up the course of treatment for pneumonia. Patient's mental status improved her Mirapex was decreased to once a day at night for restless leg syndrome can consider increasing this to twice a day if her restless leg syndrome ask up. She was placed on her baclofen 3 times a day. We will check her pulse ox on room air.  Her blood pressure is been very variable during the hospital course. At times it's been low and I had a cut back on medications and then it's been high and we had to give some stat doses of medications. Looking through her medication list at home there are lots of duplications of medications and certain classes. I adjusted her medications to make sure there is no duplications. Continue to watch blood pressure  closely.  Because of her weakness physical therapy recommended rehabilitation. With her MS and don't think she moves around very well at baseline.  DISCHARGE CONDITIONS:   Satisfactory  CONSULTS OBTAINED:    none  DRUG ALLERGIES:   Allergies  Allergen Reactions  . Other Other (See Comments)    Blood thinners - GI bleed  . Prednisone Other (See Comments)    Causes hair to fall out   . Omeprazole Rash    DISCHARGE MEDICATIONS:   Current Discharge Medication List    START taking these medications   Details  amoxicillin-clavulanate (AUGMENTIN) 875-125 MG per tablet Take 1 tablet by mouth every 12 (twelve) hours. Qty: 14 tablet, Refills: 0    azithromycin (ZITHROMAX) 250 MG tablet Take one pill daily for two more days Qty: 2 each, Refills: 0      CONTINUE these medications which have CHANGED   Details  oxybutynin (DITROPAN) 5 MG tablet Take 1 tablet (5 mg total) by mouth 4 (four) times daily. Qty: 30 tablet, Refills: 0    pramipexole (MIRAPEX) 0.5 MG tablet Take 1 tablet (0.5 mg total) by mouth at bedtime. Qty: 30 tablet, Refills: 0      CONTINUE these medications which have NOT CHANGED   Details  baclofen (LIORESAL) 10 MG tablet Take 1 tablet (10 mg total) by mouth 3 (three) times daily. Qty: 90 each, Refills: 11    diltiazem (DILACOR XR) 180 MG 24 hr capsule Take 180 mg by mouth daily.    lisinopril (  PRINIVIL,ZESTRIL) 40 MG tablet TAKE 1 TABLET EVERY DAY    metoprolol tartrate (LOPRESSOR) 25 MG tablet Take 25 mg by mouth 2 (two) times daily.    simvastatin (ZOCOR) 40 MG tablet Take 40 mg by mouth daily.      STOP taking these medications     amLODipine (NORVASC) 5 MG tablet      bisoprolol-hydrochlorothiazide (ZIAC) 5-6.25 MG per tablet      hydrALAZINE (APRESOLINE) 10 MG tablet      HYDROcodone-acetaminophen (NORCO/VICODIN) 5-325 MG per tablet      tamsulosin (FLOMAX) 0.4 MG CAPS capsule          DISCHARGE INSTRUCTIONS:    Follow up with  doctor at rehabilitation 1-2 days. Follow up with your PMD once discharged from rehabilitation.  If you experience worsening of your admission symptoms, develop shortness of breath, life threatening emergency, suicidal or homicidal thoughts you must seek medical attention immediately by calling 911 or calling your MD immediately  if symptoms less severe.  You Must read complete instructions/literature along with all the possible adverse reactions/side effects for all the Medicines you take and that have been prescribed to you. Take any new Medicines after you have completely understood and accept all the possible adverse reactions/side effects.   Please note  You were cared for by a hospitalist during your hospital stay. If you have any questions about your discharge medications or the care you received while you were in the hospital after you are discharged, you can call the unit and asked to speak with the hospitalist on call if the hospitalist that took care of you is not available. Once you are discharged, your primary care physician will handle any further medical issues. Please note that NO REFILLS for any discharge medications will be authorized once you are discharged, as it is imperative that you return to your primary care physician (or establish a relationship with a primary care physician if you do not have one) for your aftercare needs so that they can reassess your need for medications and monitor your lab values.    Today   CHIEF COMPLAINT:   Chief Complaint  Patient presents with  . Altered Mental Status    HISTORY OF PRESENT ILLNESS:  Brandi Gibbs  is a 69 y.o. female with a known history of MS. She presents with altered mental status and found to have pneumonia.   VITAL SIGNS:  Blood pressure 155/54, pulse 72, temperature 98.4 F (36.9 C), temperature source Oral, resp. rate 18, height  (1.651 m), weight 66.497 kg (146 lb 9.6 oz), SpO2 98 %.  I/O:     Intake/Output Summary (Last 24 hours) at 12/09/14 1008 Last data filed at 12/09/14 0900  Gross per 24 hour  Intake    469 ml  Output    200 ml  Net    269 ml    PHYSICAL EXAMINATION:  GENERAL:  69 y.o.-year-old patient lying in the bed with no acute distress.  EYES: Pupils equal, round, reactive to light and accommodation. No scleral icterus. Extraocular muscles intact.  HEENT: Head atraumatic, normocephalic. Oropharynx and nasopharynx clear.  NECK:  Supple, no jugular venous distention. No thyroid enlargement, no tenderness.  LUNGS: Normal breath sounds bilaterally, no wheezing, rales,rhonchi or crepitation. No use of accessory muscles of respiration.  CARDIOVASCULAR: S1, S2 normal. 3/6 systolic ejection murmur, no rubs, or gallops.  ABDOMEN: Soft, non-tender, non-distended. Bowel sounds present. No organomegaly or mass.  EXTREMITIES: No pedal edema, cyanosis, or  clubbing.  NEUROLOGIC: Cranial nerves II through XII are intact.  PSYCHIATRIC: The patient is alert and oriented x 3.  SKIN: No obvious rash, lesion, or ulcer.   DATA REVIEW:   CBC  Recent Labs Lab 12/06/14 1807  WBC 7.9  HGB 11.5*  HCT 35.6  PLT 299    Chemistries   Recent Labs Lab 12/06/14 1203 12/06/14 1807  NA 143  --   K 3.6  --   CL 106  --   CO2 29  --   GLUCOSE 116*  --   BUN 10  --   CREATININE 0.65 0.72  CALCIUM 9.0  --   AST 35  --   ALT 22  --   ALKPHOS 95  --   BILITOT 0.6  --     Microbiology Results  Results for orders placed or performed during the hospital encounter of 12/06/14  Culture, blood (routine x 2)     Status: None (Preliminary result)   Collection Time: 12/06/14  1:01 PM  Result Value Ref Range Status   Specimen Description BLOOD  Final   Special Requests LEFT ANTECUBITAL  Final   Culture NO GROWTH 3 DAYS  Final   Report Status PENDING  Incomplete  Culture, blood (routine x 2)     Status: None (Preliminary result)   Collection Time: 12/06/14  1:01 PM  Result  Value Ref Range Status   Specimen Description BLOOD  Final   Special Requests HAND  Final   Culture NO GROWTH 3 DAYS  Final   Report Status PENDING  Incomplete   Management plans discussed with the patient, family and they are in agreement.  CODE STATUS:     Code Status Orders        Start     Ordered   12/06/14 1656  Do not attempt resuscitation (DNR)   Continuous    Question Answer Comment  In the event of cardiac or respiratory ARREST Do not call a "code blue"   In the event of cardiac or respiratory ARREST Do not perform Intubation, CPR, defibrillation or ACLS   In the event of cardiac or respiratory ARREST Use medication by any route, position, wound care, and other measures to relive pain and suffering. May use oxygen, suction and manual treatment of airway obstruction as needed for comfort.      12/06/14 1656    Advance Directive Documentation        Most Recent Value   Type of Advance Directive  Living will   Pre-existing out of facility DNR order (yellow form or pink MOST form)     "MOST" Form in Place?        TOTAL TIME TAKING CARE OF THIS PATIENT: 35 minutes.    Alford Highland M.D on 12/09/2014 at 10:08 AM   [facility]  Office  [phone]  CC: Primary care physician; Dorothey Baseman, MD

## 2014-12-09 NOTE — Plan of Care (Addendum)
Problem: Discharge Progression Outcomes Goal: Other Discharge Outcomes/Goals Outcome: Adequate for Discharge Patient had no c/o pain  Pt was alert and oriented this shift Patient is tolerating diet well  Patient is at baseline for mobility  Received Md order to discharge patient to [facility], report called to Otis Dials  RN, djscharged ion wheelchair with EMS to [facility]    Addendum:  Patient family decided they wanted her to go home instead, patient was sent home via EMS in [place], general practice, Social worker Huntley Dec called [facility] to let them know she was not coming

## 2014-12-10 ENCOUNTER — Other Ambulatory Visit: Payer: Self-pay

## 2014-12-10 ENCOUNTER — Emergency Department
Admission: EM | Admit: 2014-12-10 | Discharge: 2014-12-10 | Disposition: A | Discharge status: Home / Self Care | Payer: Medicare Other | Attending: Emergency Medicine | Admitting: Emergency Medicine

## 2014-12-10 DIAGNOSIS — Z792 Long term (current) use of antibiotics: Secondary | ICD-10-CM | POA: Diagnosis not present

## 2014-12-10 DIAGNOSIS — I10 Essential (primary) hypertension: Secondary | ICD-10-CM | POA: Insufficient documentation

## 2014-12-10 DIAGNOSIS — Y9289 Other specified places as the place of occurrence of the external cause: Secondary | ICD-10-CM | POA: Diagnosis not present

## 2014-12-10 DIAGNOSIS — W050XXA Fall from non-moving wheelchair, initial encounter: Secondary | ICD-10-CM | POA: Insufficient documentation

## 2014-12-10 DIAGNOSIS — Z043 Encounter for examination and observation following other accident: Secondary | ICD-10-CM | POA: Diagnosis present

## 2014-12-10 DIAGNOSIS — Z79899 Other long term (current) drug therapy: Secondary | ICD-10-CM | POA: Insufficient documentation

## 2014-12-10 DIAGNOSIS — G35 Multiple sclerosis: Secondary | ICD-10-CM | POA: Diagnosis not present

## 2014-12-10 DIAGNOSIS — R531 Weakness: Secondary | ICD-10-CM

## 2014-12-10 DIAGNOSIS — Z87891 Personal history of nicotine dependence: Secondary | ICD-10-CM | POA: Diagnosis not present

## 2014-12-10 DIAGNOSIS — Y998 Other external cause status: Secondary | ICD-10-CM | POA: Diagnosis not present

## 2014-12-10 DIAGNOSIS — Y9389 Activity, other specified: Secondary | ICD-10-CM | POA: Insufficient documentation

## 2014-12-10 DIAGNOSIS — S301XXA Contusion of abdominal wall, initial encounter: Secondary | ICD-10-CM | POA: Insufficient documentation

## 2014-12-10 DIAGNOSIS — M6281 Muscle weakness (generalized): Secondary | ICD-10-CM | POA: Diagnosis not present

## 2014-12-10 LAB — CBC
HCT: 33.1 % — ABNORMAL LOW (ref 35.0–47.0)
Hemoglobin: 10.6 g/dL — ABNORMAL LOW (ref 12.0–16.0)
MCH: 26.3 pg (ref 26.0–34.0)
MCHC: 32.1 g/dL (ref 32.0–36.0)
MCV: 81.9 fL (ref 80.0–100.0)
Platelets: 300 10*3/uL (ref 150–440)
RBC: 4.05 MIL/uL (ref 3.80–5.20)
RDW: 16.3 % — AB (ref 11.5–14.5)
WBC: 10.7 10*3/uL (ref 3.6–11.0)

## 2014-12-10 LAB — BASIC METABOLIC PANEL
Anion gap: 12 (ref 5–15)
BUN: 27 mg/dL — ABNORMAL HIGH (ref 6–20)
CALCIUM: 9.4 mg/dL (ref 8.9–10.3)
CO2: 26 mmol/L (ref 22–32)
Chloride: 101 mmol/L (ref 101–111)
Creatinine, Ser: 0.82 mg/dL (ref 0.44–1.00)
GFR calc Af Amer: >60 mL/min (ref >60)
Glucose, Bld: 135 mg/dL — ABNORMAL HIGH (ref 65–99)
Potassium: 3.3 mmol/L — ABNORMAL LOW (ref 3.5–5.1)
SODIUM: 139 mmol/L (ref 135–145)

## 2014-12-10 NOTE — ED Provider Notes (Signed)
[facility] Emergency Department Provider Note  ____________________________________________  Time seen: Approximately 1:03 PM  I have reviewed the triage vital signs and the nursing notes.   HISTORY  Chief Complaint Fall    HPI Brandi Gibbs is a 69 y.o. female with an extensive past medical history that includes multiple sclerosis and unspecified movement disorder who was discharged from the hospital yesterday after being treated for pneumonia.  They placed her at [facility] but she and her husband are unable to afford the co-pay, so they returned home.  She is deconditioned and weak after her recent illness and has not been able to transfer from her lift chair.  This morning she had a bowel movement and was attempting to clean herself when she fell forward from her wheelchair.  She did not lose consciousness and did not appear to sustain any injuries other than possibly a small bruise to the left side of her abdomen which, according to the patient and her spouse, they are not sure if it is new or old.  Her only complaint now is that she is hungry.  She and her husband are both concerned though about her generalized weakness and the difficulty they have at home.  They do have some limited home care options including a nurse that can come out today.   Past Medical History  Diagnosis Date  . Multiple sclerosis   . Movement disorder   . Hypertension   . Vision abnormalities   . Restless leg syndrome   . Allergy   . Anxiety   . Arthritis   . GERD (gastroesophageal reflux disease)   . Neuromuscular disorder   . Seizures   . Thyroid disease     Patient Active Problem List   Diagnosis Date Noted  . Altered mental state 12/06/2014  . Ataxic gait 09/11/2014  . Cognitive changes 09/11/2014  . Other fatigue 09/11/2014  . Urinary hesitancy 09/11/2014  . Spasticity 09/11/2014  . Midline low back pain without sciatica 09/11/2014  . Insomnia 09/11/2014   . Restless leg 09/11/2014  . Snoring 09/11/2014  . DS (disseminated sclerosis) 03/28/2014  . Osteopenia 03/28/2014  . Hyperthyroidism 03/28/2014  . BP (high blood pressure) 03/28/2014  . HLD (hyperlipidemia) 03/28/2014    Past Surgical History  Procedure Laterality Date  . Partial hysterectomy    . Back surgery      Current Outpatient Rx  Name  Route  Sig  Dispense  Refill  . amoxicillin-clavulanate (AUGMENTIN) 875-125 MG per tablet   Oral   Take 1 tablet by mouth every 12 (twelve) hours.   14 tablet   0   . azithromycin (ZITHROMAX) 250 MG tablet      Take one pill daily for two more days   2 each   0   . baclofen (LIORESAL) 10 MG tablet   Oral   Take 1 tablet (10 mg total) by mouth 3 (three) times daily.   90 each   11   . diltiazem (DILACOR XR) 180 MG 24 hr capsule   Oral   Take 180 mg by mouth daily.         Marland Kitchen lisinopril (PRINIVIL,ZESTRIL) 40 MG tablet      TAKE 1 TABLET EVERY DAY         . metoprolol tartrate (LOPRESSOR) 25 MG tablet   Oral   Take 25 mg by mouth 2 (two) times daily.         Marland Kitchen oxybutynin (DITROPAN) 5  MG tablet   Oral   Take 1 tablet (5 mg total) by mouth 4 (four) times daily.   30 tablet   0   . pramipexole (MIRAPEX) 0.5 MG tablet   Oral   Take 1 tablet (0.5 mg total) by mouth at bedtime.   30 tablet   0   . simvastatin (ZOCOR) 40 MG tablet   Oral   Take 40 mg by mouth daily.           Allergies Other; Prednisone; and Omeprazole  Family History  Problem Relation Age of Onset  . Leukemia Mother   . Healthy Father     Social History History  Substance Use Topics  . Smoking status: Former Smoker    Types: Cigarettes  . Smokeless tobacco: Not on file  . Alcohol Use: No     Comment: rare/fim    Review of Systems Constitutional: No fever/chills Eyes: No visual changes. ENT: No sore throat. Cardiovascular: Denies chest pain. Respiratory: Denies shortness of breath. Gastrointestinal: No abdominal pain.  No  nausea, no vomiting.  No diarrhea.  No constipation. Genitourinary: Negative for dysuria. Musculoskeletal: Negative for back pain. Skin: Negative for rash. Neurological: Chronic weakness particularly in lower extremities and increased generalized weakness  10-point ROS otherwise negative.  ____________________________________________   PHYSICAL EXAM:  VITAL SIGNS: ED Triage Vitals  Enc Vitals Group     BP 12/10/14 1230 153/60 mmHg     Pulse --      Resp --      Temp 12/10/14 1230 97.5 F (36.4 C)     Temp Source 12/10/14 1230 Oral     SpO2 12/10/14 1220 95 %     Weight 12/10/14 1230 155 lb (70.308 kg)     Height 12/10/14 1230  (1.6 m)     Head Cir --      Peak Flow --      Pain Score --      Pain Loc --      Pain Edu? --      Excl. in GC? --     Constitutional: Alert and oriented. Well appearing and in no acute distress. Eyes: Conjunctivae are normal. PERRL. EOMI. Head: Atraumatic. Nose: No congestion/rhinnorhea. Mouth/Throat: Mucous membranes are moist.  Oropharynx non-erythematous. Neck: No stridor.  No cervical spine tenderness to palpation. Cardiovascular: Normal rate, regular rhythm. Grossly normal heart sounds.  Good peripheral circulation. Respiratory: Normal respiratory effort.  No retractions. Lungs CTAB. Gastrointestinal: Soft and nontender. No distention. No abdominal bruits. No CVA tenderness. Small nontender bruise on lateral left lower abdomen. Musculoskeletal: No lower extremity tenderness nor edema.  No joint effusions. Neurologic:  Moves extremities but is weak throughout.  Apparently roughly at her baseline but deconditioned with more generalized weakness than usual Skin:  Skin is warm, dry and intact. No rash noted.   Psychiatric: Mood and affect are normal. Speech and behavior are normal.  ____________________________________________   LABS (all labs ordered are listed, but only abnormal results are displayed)  Labs Reviewed  CBC -  Abnormal; Notable for the following:    Hemoglobin 10.6 (*)    HCT 33.1 (*)    RDW 16.3 (*)    All other components within normal limits  BASIC METABOLIC PANEL - Abnormal; Notable for the following:    Potassium 3.3 (*)    Glucose, Bld 135 (*)    BUN 27 (*)    All other components within normal limits   ____________________________________________  EKG  ED  ECG REPORT   Date: 12/10/2014  EKG Time: 12:30  Rate: 80  Rhythm: normal sinus rhythm  Axis: Normal  Intervals:none  ST&T Change: Non-specific ST segment / T-wave changes, but no evidence of acute ischemia.  ____________________________________________  RADIOLOGY  RECENT IMAGING (not today)  Ct Head Wo Contrast  12/06/2014   CLINICAL DATA:  Altered mental status.  Delirium.  EXAM: CT HEAD WITHOUT CONTRAST  TECHNIQUE: Contiguous axial images were obtained from the base of the skull through the vertex without intravenous contrast.  COMPARISON:  10/11/2014  FINDINGS: The brain shows generalized atrophy. There is extensive chronic small vessel ischemic change throughout the white matter. 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 The brainstem shows extensive small-vessel ischemic change. No evidence of acute infarction, mass lesion, hemorrhage, hydrocephalus or extra-axial collection. No calvarial abnormality. No inflammatory sinus disease. There is atherosclerotic calcification of the major vessels at the base of the brain.  IMPRESSION: No acute or reversible finding. Extensive small vessel ischemic changes throughout the brain as outlined above.   Electronically Signed   By: Paulina Fusi M.D.   On: 12/06/2014 13:38   US Soft Tissue Head/neck  12/02/2014   CLINICAL DATA:  69 year old female with hypothyroidism  EXAM: THYROID ULTRASOUND  TECHNIQUE: Ultrasound examination of the thyroid gland and adjacent soft tissues was performed.  COMPARISON:  Prior brain MRI 10/14/2014 ; prior bilateral carotid  duplex ultrasound 10/22/2008  FINDINGS: Right thyroid lobe  Measurements: 4.5 x 2.5 x 2.8 cm. Diffusely heterogeneous and lobular thyroid gland. No discrete nodule identified.  Left thyroid lobe  Measurements: 3.3 x 2.0 x 1.2 cm. Diffusely heterogeneous and lobular thyroid gland. No discrete nodule identified.  Isthmus  Thickness: 0.4 cm.  No nodules visualized.  Lymphadenopathy  None visualized.  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  No discrete nodules.   Electronically Signed   By: Malachy Moan M.D.   On: 12/02/2014 16:29   Dg Chest Port 1 View  12/06/2014   CLINICAL DATA:  Altered mental status  EXAM: PORTABLE CHEST - 1 VIEW  COMPARISON:  October 14, 2014  FINDINGS: There is focal airspace consolidation in the right base. Lungs elsewhere clear. Heart is upper normal in size with pulmonary vascularity within normal limits. No adenopathy. There is atherosclerotic change in the aorta.  IMPRESSION: Focal airspace disease right base, most consistent with pneumonia. Aspiration is a differential consideration. Lungs elsewhere clear. No change in cardiac silhouette. Atherosclerotic change in aorta.   Electronically Signed   By: Bretta Bang III M.D.   On: 12/06/2014 13:30    ____________________________________________   PROCEDURES  Procedure(s) performed: None  Critical Care performed: No  ____________________________________________   INITIAL IMPRESSION / ASSESSMENT AND PLAN / ED COURSE  Pertinent labs & imaging results that were available during my care of the patient were reviewed by me and considered in my medical decision making (see chart for details).  The patient does not appear to have any acute injuries.  I talked with the patient and her husband about how I can best assist them today.  They are most concerned about her long-term care.  I discussed the situation with our case manager,  Elnita Maxwell, who also spoke in person with the family and provided additional resources and options.  They are limited by finances which will be problematic, but they do have the home health nurse currently and the patient's PCP will be  working with them on additional options.  At this time I do not see an acute indication for hospitalization.  I discussed this a second time with the patient and her husband who understand. ____________________________________________   FINAL CLINICAL IMPRESSION(S) / ED DIAGNOSES  Final diagnoses:  Generalized weakness  Multiple sclerosis     Loleta Rose, MD 12/10/14 1358

## 2014-12-10 NOTE — ED Notes (Signed)
D/c instructions reviewed with pts husband, verbalized understanding.

## 2014-12-10 NOTE — ED Notes (Signed)
Patient denies pain and is resting comfortably.  

## 2014-12-10 NOTE — ED Notes (Addendum)
Pt brought in via EMS post fall.  Husband initiated call and patient is unsure why he called EMS.  Pt discharged from hospital 12-09-14 for treatment of pneumonia.  Today patient presents after fall from wheelchair.  Patient reports she had a bowel movement and was trying to clean herself and fell forward from wheelchair. Patient denies hitting her head and denies pain.  Patient did verbalize that when her husband was trying to help her from floor he moved the wheelchair to get to her and accidentally rolled over her left lower abdomen with wheel.  Patient has light blue bruise to left lower abdomen.  Husband reports patient falling around 11am this morning.

## 2014-12-10 NOTE — Discharge Instructions (Signed)
We do not recommend any medication changes at this time.  Please call your primary care doctor for the next available follow-up appointment to discuss additional short and long-term care options.  Please return to the emergency department with new or worsening symptoms that concern you.   Weakness Weakness is a lack of strength. You may feel weak all over your body or just in one part of your body. Weakness can be serious. In some cases, you may need more medical tests. HOME CARE  Rest.  Eat a well-balanced diet.  Try to exercise every day.  Only take medicines as told by your doctor. GET HELP RIGHT AWAY IF:   You cannot do your normal daily activities.  You cannot walk up and down stairs, or you feel very tired when you do so.  You have shortness of breath or chest pain.  You have trouble moving parts of your body.  You have weakness in only one body part or on only one side of the body.  You have a fever.  You have trouble speaking or swallowing.  You cannot control when you pee (urinate) or poop (bowel movement).  You have black or bloody throw up (vomit) or poop.  Your weakness gets worse or spreads to other body parts.  You have new aches or pains. MAKE SURE YOU:   Understand these instructions.  Will watch your condition.  Will get help right away if you are not doing well or get worse. Document Released: 06/24/2008 Document Revised: 01/11/2012 Document Reviewed: 09/10/2011 [facility] Patient Information 2015 [place], [place]. This information is not intended to replace advice given to you by your health care provider. Make sure you discuss any questions you have with your health care provider.  Multiple Sclerosis Multiple sclerosis (MS) is a disease of the central nervous system. It leads to the loss of the insulating covering of the nerves (myelin sheath) of your brain. When this happens, brain signals do not get sent properly or may not get sent at all. The age  of onset of MS varies.  CAUSES The cause of MS is unknown. However, it is more common in the [place] than in the [place]. RISK FACTORS There is a higher number of women with MS than men. MS is not an illness that is passed down to you from your family members (inherited). However, your risk of MS is higher if you have a relative with MS. SIGNS AND SYMPTOMS  The symptoms of MS occur in episodes or attacks. These attacks may last weeks to months. There may be long periods of almost no symptoms between attacks. The symptoms of MS vary. This is because of the many different ways it affects the central nervous system. The main symptoms of MS include:  Vision problems and eye pain.  Numbness.  Weakness.  Inability to move your arms, hands, feet, or legs (paralysis).  Balance problems.  Tremors. DIAGNOSIS  Your health care provider can diagnose MS with the help of imaging exams and lab tests. These may include specialized X-ray exams and spinal fluid tests. The best imaging exam to confirm a diagnosis of MS is an MRI. TREATMENT  There is no known cure for MS, but there are medicines that can decrease the number and frequency of attacks. Steroids are often used for short-term relief. Physical and occupational therapy may also help. There are also many new alternative or complementary treatments available to help control the symptoms of MS. Ask your  health care provider if any of these other options are right for you. HOME CARE INSTRUCTIONS   Take medicines as directed by your health care provider.  Exercise as directed by your health care provider. SEEK MEDICAL CARE IF: You begin to feel depressed. SEEK IMMEDIATE MEDICAL CARE IF:  You develop paralysis.  You have problems with bladder, bowel, or sexual function.  You develop mental changes, such as forgetfulness or mood swings.  You have a period of uncontrolled movements (seizure). Document Released:  07/09/2000 Document Revised: 07/17/2013 Document Reviewed: 03/19/2013 [facility] Patient Information 2015 [place], [place]. This information is not intended to replace advice given to you by your health care provider. Make sure you discuss any questions you have with your health care provider.

## 2014-12-11 LAB — CULTURE, BLOOD (ROUTINE X 2)
Culture: NO GROWTH
Culture: NO GROWTH

## 2014-12-24 ENCOUNTER — Ambulatory Visit (INDEPENDENT_AMBULATORY_CARE_PROVIDER_SITE_OTHER): Payer: Medicare Other | Admitting: Neurology

## 2014-12-24 ENCOUNTER — Ambulatory Visit: Payer: Medicare Other | Admitting: Neurology

## 2014-12-24 ENCOUNTER — Encounter: Payer: Self-pay | Admitting: Neurology

## 2014-12-24 VITALS — BP 164/68 | HR 70 | Resp 16 | Ht 63.0 in | Wt 154.0 lb

## 2014-12-24 DIAGNOSIS — R3911 Hesitancy of micturition: Secondary | ICD-10-CM

## 2014-12-24 DIAGNOSIS — R5383 Other fatigue: Secondary | ICD-10-CM | POA: Diagnosis not present

## 2014-12-24 DIAGNOSIS — G4700 Insomnia, unspecified: Secondary | ICD-10-CM | POA: Diagnosis not present

## 2014-12-24 DIAGNOSIS — R4189 Other symptoms and signs involving cognitive functions and awareness: Secondary | ICD-10-CM | POA: Diagnosis not present

## 2014-12-24 DIAGNOSIS — R260 Ataxic gait: Secondary | ICD-10-CM | POA: Diagnosis not present

## 2014-12-24 DIAGNOSIS — R252 Cramp and spasm: Secondary | ICD-10-CM

## 2014-12-24 DIAGNOSIS — G35 Multiple sclerosis: Secondary | ICD-10-CM

## 2014-12-24 DIAGNOSIS — G2581 Restless legs syndrome: Secondary | ICD-10-CM | POA: Diagnosis not present

## 2014-12-24 DIAGNOSIS — M25519 Pain in unspecified shoulder: Secondary | ICD-10-CM | POA: Insufficient documentation

## 2014-12-24 DIAGNOSIS — R258 Other abnormal involuntary movements: Secondary | ICD-10-CM

## 2014-12-24 DIAGNOSIS — M25511 Pain in right shoulder: Secondary | ICD-10-CM | POA: Diagnosis not present

## 2014-12-24 NOTE — Progress Notes (Signed)
[facility]  PATIENT: Brandi Gibbs DOB: [birth year]  REFERRING DOCTOR OR PCP:  Dorothey Baseman SOURCE: Patient  _________________________________   HISTORICAL  CHIEF COMPLAINT:  Chief Complaint  Patient presents with  . Multiple Sclerosis    Sts. has been in and out of the hospital [facility]) since March for uti.  Is now at [facility] for rehab.  She sts. generalized weakness is worse./fim    HISTORY OF PRESENT ILLNESS:  Brandi Gibbs is a 69 year old with multiple sclerosis.   She had two recent hospitalization for urosepsis and pneumonia.   She was discharged to [facility].     Currently, she is not on any DMT. She stopped Avonex recently and never started on Aubagio.    Right shoulder is hurting more.    Gait/strength/sensation:  She felt much weaker with the infections an needed more help to transfer.  Gait has been one of her worse problems.   Her son notes that she is still not at her recent baseline since the two hospitalizations.   She used to transfer without help and now needs help.   She began to use a cane in the 1990s and began to use a walker about 10 years later. Her right leg is much worse than the left leg. She notes weakness as well as quite a bit of spasticity in the right leg. At times, her leg will have a phasic spasm that takes a while to break. She falls on occasion with a couple falls over the last few months. She denies any significant numbness, tingling or dysesthesias in the legs.  Bladder:  She also has had a lot of difficulty with her bladder, having more incontinence. She has urinary hesitancy as well as frequency. She is on oxybutynin and it looks like tamsulosin was stopped.   She has had the one severe UTI but not milder ones this year.     Vision:  She notes more blurry vision. She denies double vision. There is no eye pain. She denies any significant asymmetry in acuity or color vision.   She has  glasses but does not wear them.     Fatigue/sleep:   She notes both physical and cognitive fatigue daily and she has more sleepiness.   These issues vary a lot day to day.  She does worse in hot weather.  She reports insomnia with more difficulty staying asleep and falling asleep the first time. She reports that she snores and her husband has told her that she has some irregular breathing at night. She has excessive daytime sleepiness but has turned down PSG multiple times.     She has RLS but not every night.     Mood/cognition:  She denies any significant depression but husband feels she has some.  She feels that her cognitive issues are getting worse. Specifically she has noted difficulty with short-term memory, verbal fluency, apathy and with executive function.   Right shoulder pain:   Over the past few months she has had more pain in the right shoulder. She notes restricted range of motion due to pain. Specifically, raising or externally rotating her shoulder is painful. Pain is a little bit better when she holds the arm in.   MS History:   She was diagnosed in the mid 1980s after presenting with right sided paralysis. She had MRIs and a lumbar puncture performed. They were reportedly consistent with multiple sclerosis. Initially, she was not placed on  any medications. She had several exacerbations though in the 1990s that affected her gait. I started to see her around 2001 or 2002. At that time, her gait was poor and she needed a cane for short distance and walker for longer distance we started Avonex and she tolerated it well. MRIs did not show any major changes since that time. However, she has continued to have some worsening of her gait and now needs to use a walker full-time and also cannot walk as far. We had prescribed Aubagio but she never started.       REVIEW OF SYSTEMS: Constitutional: No fevers, chills, sweats, or change in appetite.   She is very tired all the time Eyes: Some visual  changes, but no double vision or eye pain Ear, nose and throat: No hearing loss, ear pain, nasal congestion, sore throat Cardiovascular: No chest pain, palpitations Respiratory: No shortness of breath at rest or with exertion.   No wheezes GastrointestinaI: No nausea, vomiting, diarrhea, abdominal pain, fecal incontinence Genitourinary: see above. Musculoskeletal: Mild neck pain, moderate back pain Integumentary: No rash, pruritus, skin lesions Neurological: as above Psychiatric: No depression at this time.  No anxiety. However, apathetic and cognitive changes (as above) Endocrine: No palpitations, diaphoresis, change in appetite, change in weigh or increased thirst Hematologic/Lymphatic: No anemia, purpura, petechiae. Allergic/Immunologic: No itchy/runny eyes, nasal congestion, recent allergic reactions, rashes  ALLERGIES: Allergies  Allergen Reactions  . Other Other (See Comments)    Blood thinners - GI bleed  . Prednisone Other (See Comments)    Causes hair to fall out   . Omeprazole Rash    HOME MEDICATIONS:  Current outpatient prescriptions:  .  baclofen (LIORESAL) 10 MG tablet, Take 1 tablet (10 mg total) by mouth 3 (three) times daily., Disp: 90 each, Rfl: 11 .  diltiazem (DILACOR XR) 180 MG 24 hr capsule, Take 180 mg by mouth daily., Disp: , Rfl:  .  lisinopril (PRINIVIL,ZESTRIL) 40 MG tablet, TAKE 1 TABLET EVERY DAY, Disp: , Rfl:  .  metoprolol tartrate (LOPRESSOR) 25 MG tablet, Take 25 mg by mouth 2 (two) times daily., Disp: , Rfl:  .  oxybutynin (DITROPAN) 5 MG tablet, Take 1 tablet (5 mg total) by mouth 4 (four) times daily., Disp: 30 tablet, Rfl: 0 .  pramipexole (MIRAPEX) 0.5 MG tablet, Take 1 tablet (0.5 mg total) by mouth at bedtime., Disp: 30 tablet, Rfl: 0 .  simvastatin (ZOCOR) 40 MG tablet, Take 40 mg by mouth daily., Disp: , Rfl:   PAST MEDICAL HISTORY: Past Medical History  Diagnosis Date  . Multiple sclerosis   . Movement disorder   . Hypertension     . Vision abnormalities   . Restless leg syndrome   . Allergy   . Anxiety   . Arthritis   . GERD (gastroesophageal reflux disease)   . Neuromuscular disorder   . Seizures   . Thyroid disease     PAST SURGICAL HISTORY: Past Surgical History  Procedure Laterality Date  . Partial hysterectomy    . Back surgery      FAMILY HISTORY: Family History  Problem Relation Age of Onset  . Leukemia Mother   . Healthy Father     SOCIAL HISTORY:  History   Social History  . Marital Status: Married    Spouse Name: N/A  . Number of Children: N/A  . Years of Education: N/A   Occupational History  . Not on file.   Social History Main Topics  . Smoking  status: Former Smoker    Types: Cigarettes  . Smokeless tobacco: Not on file  . Alcohol Use: No     Comment: rare/fim  . Drug Use: No  . Sexual Activity: No   Other Topics Concern  . Not on file   Social History Narrative     PHYSICAL EXAM  Filed Vitals:   12/24/14 1305  BP: 164/68  Pulse: 70  Resp: 16  Height:  (1.6 m)  Weight: 154 lb (69.854 kg)    Body mass index is 27.29 kg/(m^2).   General: The patient is well-developed and well-nourished and in no acute distress  Musculoskeletal:  Back is nontender.   Right shoulder is tender over AC joint and subacromial bursa  Neurologic Exam  Mental status: The patient is alert and oriented x 3 at the time of the examination. The patient has reduced attention span and concentration ability.   Speech is normal.  Cranial nerves: Extraocular movements are full. .  Facial symmetry is present. There is good facial sensation to soft touch bilaterally.Facial strength is normal.  Trapezius and sternocleidomastoid strength is normal. No dysarthria is noted.  The tongue is midline, and the patient has symmetric elevation of the soft palate.  Motor:  Muscle bulk is normal.   Tone is increased in both legs, slightly more on the rightl. Strength is  5 / 5 in left arms, 4++  right (drift only), 4/5 in right foot and 4+/5 in left foot..   Sensory: Sensory testing is intact to pinprick, soft touch and vibration sensation in all 4 extremities.  Coordination: Cerebellar testing reveals good finger-nose-finger and heel-to-shin bilaterally.  Gait and station: She has trouble rising from a chair but is able to stand independently once she is up..   Gait is spastic and requires bilateral support for even one step.  She has right foot drop.   Reflexes: Deep tendon reflexes are symmetric and increased bilaterally, worse on right.  Spread at knees.   2 beats clonus at right ankle.      DIAGNOSTIC DATA (LABS, IMAGING, TESTING) - I reviewed patient records, labs, notes, testing and imaging myself where available.     ASSESSMENT AND PLAN  DS (disseminated sclerosis)  Ataxic gait  Cognitive changes  Other fatigue  Urinary hesitancy  Spasticity  Insomnia  Restless leg   1.  We discussed getting onto Aubagio for her MS. She will need to have monthly LFTs for 5 or 6 months. 2.   She prefers not to start an antidepressant or to resume tamsulosin at this point. 3.   She is advised to try to walk with her walker at least a little bit every day. 4.  Inject right AC joint and subacromial bursa with a total of 60 mg depo- Medrol in 3 mL Marcaine using sterile technique. She tolerated the procedure well and pain was better afterwards. 5.   She will return in 4 months or sooner if she has new or worsening neurologic symptoms.   Makhya Arave A. Epimenio Foot, MD, PhD 12/24/2014, 1:15 PM Certified in Neurology, Clinical Neurophysiology, Sleep Medicine, Pain Medicine and Neuroimaging  [facility] [address], [address] [place], [place] [address] [phone]

## 2015-01-10 ENCOUNTER — Ambulatory Visit: Payer: Medicare Other | Admitting: Neurology

## 2015-01-15 ENCOUNTER — Telehealth: Payer: Self-pay | Admitting: Neurology

## 2015-01-15 NOTE — Telephone Encounter (Signed)
Patient's husband is calling back to advise that the forms were mailed 01-13-15 not faxed.

## 2015-01-15 NOTE — Telephone Encounter (Signed)
LMTC./fim 

## 2015-01-15 NOTE — Telephone Encounter (Signed)
LMTC--I have not received any paperwork for [place].  What fax # did he use?

## 2015-01-15 NOTE — Telephone Encounter (Signed)
Patient called stating he sent forms 2 days ago regarding his wife. The forms are from [facility] [employer] and [employer] for medical necessity. He is calling to make sure the forms get filled out and faxed and a copy sent to him. Please call him when forms are ready. He can be reached at [phone].

## 2015-01-15 NOTE — Telephone Encounter (Signed)
Noted.  I will watch for forms to arrive in the mail/fim

## 2015-01-16 NOTE — Telephone Encounter (Signed)
Husband called stating he faxed the forms from [employer] about 11:30. Please watch for forms.

## 2015-01-17 ENCOUNTER — Telehealth: Payer: Self-pay | Admitting: Neurology

## 2015-01-17 NOTE — Telephone Encounter (Signed)
I have spoken with Brandi Gibbs this morning.  She c/o increased pain, difficulty straightening her left leg at 4am today.  Sts. pain has now resolved and she is able to straighten her leg.  Sts. she is not sure if she was having a muscle spasm or if leg was just stiffer than usual, if sx. were due to position, etc.   Sts. she is in rehab right now at [facility] in [place]--sts. staff is giving her Baclofen tid as directed.  As this is the only episode, and it has now completely resolved, she is aware it is difficult to identify cause.  If she has further episodes she will call me back/fim

## 2015-01-17 NOTE — Telephone Encounter (Signed)
The patient is calling because she is having problems with her MS and she is extra slow this morning and not feeling well. Please call patient and discuss. Patient is currently at rehab. Thank you.

## 2015-01-17 NOTE — Telephone Encounter (Signed)
I have spoken with Brandi Gibbs this morning and advised that [name] has completed his portion of paperwork--that there is still a portion for Brandi Gibbs/Brandi Gibbs to complete.  He verbalized understanding of same--I have faxed paperwork to [employer] at fax # [phone]./fim

## 2015-01-17 NOTE — Telephone Encounter (Signed)
Patients husband called and requested that Faith RN call and let them know when the forms have been completed and faxed back. Please call and advise.

## 2015-02-20 ENCOUNTER — Other Ambulatory Visit: Payer: Self-pay | Admitting: Family Medicine

## 2015-02-20 DIAGNOSIS — E0590 Thyrotoxicosis, unspecified without thyrotoxic crisis or storm: Secondary | ICD-10-CM

## 2015-02-25 ENCOUNTER — Ambulatory Visit
Admission: RE | Admit: 2015-02-25 | Discharge: 2015-02-25 | Disposition: A | Discharge status: Home / Self Care | Payer: Medicare Other | Source: Ambulatory Visit | Attending: Family Medicine | Admitting: Family Medicine

## 2015-02-25 DIAGNOSIS — R591 Generalized enlarged lymph nodes: Secondary | ICD-10-CM | POA: Insufficient documentation

## 2015-02-25 DIAGNOSIS — E0590 Thyrotoxicosis, unspecified without thyrotoxic crisis or storm: Secondary | ICD-10-CM

## 2015-02-25 DIAGNOSIS — R946 Abnormal results of thyroid function studies: Secondary | ICD-10-CM | POA: Insufficient documentation

## 2015-03-17 ENCOUNTER — Telehealth: Payer: Self-pay | Admitting: Neurology

## 2015-03-17 MED ORDER — TERIFLUNOMIDE 14 MG PO TABS: 14.0000 mg | ORAL_TABLET | Freq: Every day | ORAL | Status: DC

## 2015-03-17 NOTE — Telephone Encounter (Signed)
Patient called again requesting to speak with Brandi Gibbs. I advised that she had been given the message from her previous call and will call her back at some point but that they are seeing patients right now.

## 2015-03-17 NOTE — Telephone Encounter (Signed)
I have spoken with Brandi Gibbs this morning.  Aubagio r/f escribed to [facility] per her request/fim

## 2015-03-17 NOTE — Telephone Encounter (Signed)
Patient called, would like for Faith to call her back

## 2015-03-17 NOTE — Telephone Encounter (Signed)
LMTC./fim 

## 2015-03-19 NOTE — Telephone Encounter (Signed)
I have spoken with Charisse today.  She sts. she has still not received Aubagio.  I have spoken with Christy at [facility]--she sts. there is no problem on their end.  I have spoken with Tawanna Cooler and [facility] ([facility]).  He sts. he will call Amen today to schedule delivery/fim

## 2015-03-19 NOTE — Telephone Encounter (Signed)
Patient is calling back to discuss medication Aubagio. Please call.

## 2015-03-19 NOTE — Telephone Encounter (Signed)
Patient called back to talk with [facility]

## 2015-04-21 ENCOUNTER — Other Ambulatory Visit: Payer: Self-pay | Admitting: Internal Medicine

## 2015-04-21 DIAGNOSIS — R7989 Other specified abnormal findings of blood chemistry: Secondary | ICD-10-CM

## 2015-04-28 ENCOUNTER — Telehealth: Payer: Self-pay | Admitting: *Deleted

## 2015-04-28 ENCOUNTER — Ambulatory Visit (INDEPENDENT_AMBULATORY_CARE_PROVIDER_SITE_OTHER): Payer: Medicare Other | Admitting: Neurology

## 2015-04-28 ENCOUNTER — Encounter: Payer: Self-pay | Admitting: Neurology

## 2015-04-28 VITALS — BP 160/80 | HR 68 | Resp 18 | Ht 63.0 in | Wt 154.0 lb

## 2015-04-28 DIAGNOSIS — R258 Other abnormal involuntary movements: Secondary | ICD-10-CM

## 2015-04-28 DIAGNOSIS — R4189 Other symptoms and signs involving cognitive functions and awareness: Secondary | ICD-10-CM

## 2015-04-28 DIAGNOSIS — G2581 Restless legs syndrome: Secondary | ICD-10-CM | POA: Diagnosis not present

## 2015-04-28 DIAGNOSIS — R3911 Hesitancy of micturition: Secondary | ICD-10-CM

## 2015-04-28 DIAGNOSIS — M545 Low back pain, unspecified: Secondary | ICD-10-CM

## 2015-04-28 DIAGNOSIS — R5383 Other fatigue: Secondary | ICD-10-CM | POA: Diagnosis not present

## 2015-04-28 DIAGNOSIS — G4700 Insomnia, unspecified: Secondary | ICD-10-CM | POA: Diagnosis not present

## 2015-04-28 DIAGNOSIS — R259 Unspecified abnormal involuntary movements: Secondary | ICD-10-CM

## 2015-04-28 DIAGNOSIS — G35 Multiple sclerosis: Secondary | ICD-10-CM | POA: Diagnosis not present

## 2015-04-28 DIAGNOSIS — R269 Unspecified abnormalities of gait and mobility: Secondary | ICD-10-CM | POA: Diagnosis not present

## 2015-04-28 DIAGNOSIS — R0683 Snoring: Secondary | ICD-10-CM | POA: Diagnosis not present

## 2015-04-28 DIAGNOSIS — R252 Cramp and spasm: Secondary | ICD-10-CM

## 2015-04-28 MED ORDER — OXYBUTYNIN CHLORIDE 5 MG PO TABS: 5.0000 mg | ORAL_TABLET | Freq: Three times a day (TID) | ORAL | Status: DC

## 2015-04-28 MED ORDER — PRAMIPEXOLE DIHYDROCHLORIDE 0.5 MG PO TABS: 0.5000 mg | ORAL_TABLET | Freq: Every day | ORAL | Status: DC

## 2015-04-28 MED ORDER — BACLOFEN 10 MG PO TABS: 10.0000 mg | ORAL_TABLET | Freq: Three times a day (TID) | ORAL | Status: AC

## 2015-04-28 NOTE — Patient Instructions (Signed)
If you have trouble getting the Aubagio, give [place] a call in 2 weeks

## 2015-04-28 NOTE — Progress Notes (Signed)
[facility]  PATIENT: Brandi Gibbs DOB: 11/12/1945  REFERRING DOCTOR OR PCP:  Dorothey Baseman SOURCE: Patient  _________________________________   HISTORICAL  CHIEF COMPLAINT:  Chief Complaint  Patient presents with  . Multiple Sclerosis    Sts. she has been out of Aubagio for about a month.  Sts. has not spoken with [facility] to schedule delivery of med.  I will call [facility] today and ask them to contact [facility] at her home #.  She denies new or worsening sx.  She is ambulatory with a walker today./fim    HISTORY OF PRESENT ILLNESS:  Brandi Gibbs is a 69 year old with multiple sclerosis.    She stopped Avonex recently and started on Aubagio but has been out the last week.  She was tolerating Aubagio but was only on for 3 months.     She has not noted any new exacerbations since the last visit.     Gait/strength/sensation:  She is able to walk 100 feet with a walker and she is better than after her hospitalizations earlier this year.  Her right leg is much worse than the left leg. She notes weakness as well as quite a bit of spasticity in the right leg. At night, her legs will sometime have a phasic spasm.   She has fallen once in the last month. She denies any significant numbness, tingling or dysesthesias in the legs.  Bladder:  She has difficulty with her bladder, and has some incontinence when she can't get to bathroom in time. She has urinary hesitancy as well as frequency. She is on oxybutynin.  No UTI since the last visit..     Vision:  She notes more blurry vision. She denies double vision. There is no eye pain. She denies any significant asymmetry in acuity or color vision.   She has glasses but does not wear them.     Fatigue/sleep:   She has  physical and cognitive fatigue about hte same as earlier this year.   However, she has a lot of sleepiness.    She also has insomnia with difficulty staying asleep and falling asleep many nights. She reports that she  snores and her husband has told her that she has some irregular breathing at night. She has excessive daytime sleepiness but has turned down PSG multiple times.     She has RLS but not every night.     Mood/cognition:  She denies any significant depression  She has noted difficulty with short-term memory, verbal fluency, apathy and with executive function.   Right shoulder pain:   A subacromial bursa injeciton at the last visit greatly helped th shoulder pain.  MS History:   She was diagnosed in the mid 1980s after presenting with right sided paralysis. She had MRIs and a lumbar puncture performed. They were reportedly consistent with multiple sclerosis. Initially, she was not placed on any medications. She had several exacerbations though in the 1990s that affected her gait. I started to see her around 2001 or 2002. At that time, her gait was poor and she needed a cane for short distance and walker for longer distance we started Avonex and she tolerated it well. MRIs did not show any major changes since that time. However, she has continued to have some worsening of her gait and now needs to use a walker full-time and also cannot walk as far. She started Aubagi 201      REVIEW OF SYSTEMS: Constitutional: No fevers, chills, sweats, or  change in appetite.   She is very tired all the time Eyes: Some visual changes, but no double vision or eye pain Ear, nose and throat: No hearing loss, ear pain, nasal congestion, sore throat Cardiovascular: No chest pain, palpitations Respiratory: No shortness of breath at rest or with exertion.   No wheezes GastrointestinaI: No nausea, vomiting, diarrhea, abdominal pain, fecal incontinence Genitourinary: see above. Musculoskeletal: Mild neck pain, moderate back pain Integumentary: No rash, pruritus, skin lesions Neurological: as above Psychiatric: No depression at this time.  No anxiety. However, apathetic and cognitive changes (as above) Endocrine: No  palpitations, diaphoresis, change in appetite, change in weigh or increased thirst Hematologic/Lymphatic: No anemia, purpura, petechiae. Allergic/Immunologic: No itchy/runny eyes, nasal congestion, recent allergic reactions, rashes  ALLERGIES: Allergies  Allergen Reactions  . Other Other (See Comments)    Blood thinners - GI bleed  . Prednisone Other (See Comments)    Causes hair to fall out   . Omeprazole Rash    HOME MEDICATIONS:  Current outpatient prescriptions:  .  baclofen (LIORESAL) 10 MG tablet, Take 1 tablet (10 mg total) by mouth 3 (three) times daily., Disp: 90 each, Rfl: 11 .  diltiazem (DILACOR XR) 180 MG 24 hr capsule, Take 180 mg by mouth daily., Disp: , Rfl:  .  lisinopril (PRINIVIL,ZESTRIL) 40 MG tablet, TAKE 1 TABLET EVERY DAY, Disp: , Rfl:  .  metoprolol tartrate (LOPRESSOR) 25 MG tablet, Take 25 mg by mouth 2 (two) times daily., Disp: , Rfl:  .  oxybutynin (DITROPAN) 5 MG tablet, Take 1 tablet (5 mg total) by mouth 4 (four) times daily., Disp: 30 tablet, Rfl: 0 .  simvastatin (ZOCOR) 40 MG tablet, Take 40 mg by mouth daily., Disp: , Rfl:  .  pramipexole (MIRAPEX) 0.5 MG tablet, Take 1 tablet (0.5 mg total) by mouth at bedtime., Disp: 30 tablet, Rfl: 0 .  Teriflunomide 14 MG TABS, Take 14 mg by mouth daily. (Patient not taking: Reported on 04/28/2015), Disp: 90 tablet, Rfl: 3  PAST MEDICAL HISTORY: Past Medical History  Diagnosis Date  . Multiple sclerosis (HCC)   . Movement disorder   . Hypertension   . Vision abnormalities   . Restless leg syndrome   . Allergy   . Anxiety   . Arthritis   . GERD (gastroesophageal reflux disease)   . Neuromuscular disorder (HCC)   . Seizures (HCC)   . Thyroid disease     PAST SURGICAL HISTORY: Past Surgical History  Procedure Laterality Date  . Partial hysterectomy    . Back surgery      FAMILY HISTORY: Family History  Problem Relation Age of Onset  . Leukemia Mother   . Healthy Father     SOCIAL  HISTORY:  Social History   Social History  . Marital Status: Married    Spouse Name: N/A  . Number of Children: N/A  . Years of Education: N/A   Occupational History  . Not on file.   Social History Main Topics  . Smoking status: Former Smoker    Types: Cigarettes  . Smokeless tobacco: Not on file  . Alcohol Use: No     Comment: rare/fim  . Drug Use: No  . Sexual Activity: No   Other Topics Concern  . Not on file   Social History Narrative     PHYSICAL EXAM  Filed Vitals:   04/28/15 1021  BP: 160/80  Pulse: 68  Resp: 18  Height:  (1.6 m)  Weight: 154 lb (  69.854 kg)    Body mass index is 27.29 kg/(m^2).   General: The patient is well-developed and well-nourished and in no acute distress  Musculoskeletal:  Back is nontender.   Right shoulder is nontender over AC joint and subacromial bursa today  Neurologic Exam  Mental status: The patient is alert and oriented x 3 at the time of the examination. The patient has reduced attention span and concentration ability.   Speech is normal.  Cranial nerves: Extraocular movements are full. .  Facial symmetry is present. There is good facial sensation to soft touch bilaterally.Facial strength is normal.  Trapezius and sternocleidomastoid strength is normal. No dysarthria is noted.  Mild symmetric hearing loss  Motor:  Muscle bulk is normal.   Tone is increased in both legs, slightly more on the rightl. Strength is  5 / 5 in left arms, 4++ right (drift only), 4- to 4/5 in right foot and 4+/5 in left foot..   Sensory: Sensory testing is intact to pinprick, soft touch and vibration sensation in left arm but decreased vibration in right arm/leg.    Coordination: Cerebellar testing reveals good finger-nose-finger and heel-to-shin bilaterally.  Gait and station: She can stand independently now.   Gait is spastic and requires bilateral support with walker.  She has right foot drop.   Reflexes: Deep tendon reflexes are  symmetric and increased bilaterally, worse on right.  Spread at knees.   Nonsustained right ankle clonus       DIAGNOSTIC DATA (LABS, IMAGING, TESTING) - I reviewed patient records, labs, notes, testing and imaging myself where available.     ASSESSMENT AND PLAN  Multiple sclerosis (HCC)  Abnormal involuntary movement  Abnormal gait  Cognitive changes  Other fatigue  Midline low back pain without sciatica  Insomnia  Restless leg  Snoring  Spasticity  Urinary hesitancy   1. Aubagio samples for her MS. She will need to have monthly LFTs for 5 or 6 months and she can get these closer to her home in [place].   I asked her to please call us if she runs out again. 2.   A sleep study was recommended for her sleepiness and fatigue and possibly witness OSA. However, she does not wish to proceed with this as she would not use CPAP or an oral appliance 3.   She is advised to try to be as active as possible and walk some every day.. 4.   She will return in 5 months or sooner if she has new or worsening neurologic symptoms.   Brandi Gibbs A. Epimenio Foot, MD, PhD 04/28/2015, 10:22 AM Certified in Neurology, Clinical Neurophysiology, Sleep Medicine, Pain Medicine and Neuroimaging  [facility] [address], [address] [place], [place] [address] [phone]

## 2015-04-28 NOTE — Telephone Encounter (Signed)
noted/fim 

## 2015-04-28 NOTE — Telephone Encounter (Signed)
Pt returned call. Pt was told about Aubagio delivery and was given phone number to call to schedule. Pt expressed understanding.

## 2015-04-28 NOTE — Telephone Encounter (Signed)
LMTC.  She is having trouble in arranging deliveries of her Aubagio.  I have spoken with Misty Stanley at [facility] today.  She sts. they have made mult. attempts to contact [facility] to arrange delivery, but vm has been full and they have not been able to leave messages.  They need pt. to call them at [phone] to schedule delivery of Aubagio./fim

## 2015-04-29 ENCOUNTER — Encounter
Admission: RE | Admit: 2015-04-29 | Discharge: 2015-04-29 | Disposition: A | Discharge status: Home / Self Care | Payer: Medicare Other | Source: Ambulatory Visit | Attending: Internal Medicine | Admitting: Internal Medicine

## 2015-04-29 DIAGNOSIS — R7989 Other specified abnormal findings of blood chemistry: Secondary | ICD-10-CM

## 2015-04-29 DIAGNOSIS — R946 Abnormal results of thyroid function studies: Secondary | ICD-10-CM | POA: Insufficient documentation

## 2015-04-29 MED ORDER — SODIUM IODIDE I-123 3.7 MBQ PO CAPS
100.0000 | ORAL_CAPSULE | Freq: Once | ORAL | Status: AC
  Administered 2015-04-29: 150.8 via ORAL

## 2015-04-30 ENCOUNTER — Encounter
Admission: RE | Admit: 2015-04-30 | Discharge: 2015-04-30 | Disposition: A | Discharge status: Home / Self Care | Payer: Medicare Other | Source: Ambulatory Visit | Attending: Internal Medicine | Admitting: Internal Medicine

## 2015-05-14 ENCOUNTER — Telehealth: Payer: Self-pay

## 2015-05-14 ENCOUNTER — Telehealth: Payer: Self-pay | Admitting: Neurology

## 2015-05-14 DIAGNOSIS — R259 Unspecified abnormal involuntary movements: Secondary | ICD-10-CM

## 2015-05-14 DIAGNOSIS — R252 Cramp and spasm: Secondary | ICD-10-CM

## 2015-05-14 DIAGNOSIS — G35 Multiple sclerosis: Secondary | ICD-10-CM

## 2015-05-14 DIAGNOSIS — R269 Unspecified abnormalities of gait and mobility: Secondary | ICD-10-CM

## 2015-05-14 NOTE — Telephone Encounter (Signed)
[employer] [facility]) has approved the request for continuation of coverage on Aubagio effective until 05/12/2016 Ref # [id number]

## 2015-05-14 NOTE — Telephone Encounter (Signed)
Pt's husband calling inquiring about getting a wheelchair for pt. She had a wheelchair but had to turn it back in because Medicaid advised she was at home now . He can be reached at [phone]

## 2015-05-14 NOTE — Telephone Encounter (Signed)
Rx. for standard w/c with seat cushion mailed to pt's home address/fim

## 2015-07-01 ENCOUNTER — Observation Stay
Admission: EM | Admit: 2015-07-01 | Discharge: 2015-07-03 | Disposition: A | Discharge status: Home / Self Care | Payer: Medicare Other | Attending: Internal Medicine | Admitting: Internal Medicine

## 2015-07-01 ENCOUNTER — Emergency Department: Payer: Medicare Other

## 2015-07-01 DIAGNOSIS — E0520 Thyrotoxicosis with toxic multinodular goiter without thyrotoxic crisis or storm: Secondary | ICD-10-CM | POA: Insufficient documentation

## 2015-07-01 DIAGNOSIS — G35 Multiple sclerosis: Secondary | ICD-10-CM | POA: Diagnosis present

## 2015-07-01 DIAGNOSIS — M179 Osteoarthritis of knee, unspecified: Secondary | ICD-10-CM | POA: Insufficient documentation

## 2015-07-01 DIAGNOSIS — F419 Anxiety disorder, unspecified: Secondary | ICD-10-CM | POA: Insufficient documentation

## 2015-07-01 DIAGNOSIS — Z79899 Other long term (current) drug therapy: Secondary | ICD-10-CM | POA: Insufficient documentation

## 2015-07-01 DIAGNOSIS — M8580 Other specified disorders of bone density and structure, unspecified site: Secondary | ICD-10-CM | POA: Insufficient documentation

## 2015-07-01 DIAGNOSIS — M79605 Pain in left leg: Secondary | ICD-10-CM | POA: Insufficient documentation

## 2015-07-01 DIAGNOSIS — G2581 Restless legs syndrome: Secondary | ICD-10-CM | POA: Insufficient documentation

## 2015-07-01 DIAGNOSIS — E784 Other hyperlipidemia: Secondary | ICD-10-CM | POA: Diagnosis not present

## 2015-07-01 DIAGNOSIS — G259 Extrapyramidal and movement disorder, unspecified: Secondary | ICD-10-CM | POA: Insufficient documentation

## 2015-07-01 DIAGNOSIS — I10 Essential (primary) hypertension: Secondary | ICD-10-CM | POA: Insufficient documentation

## 2015-07-01 DIAGNOSIS — Z87891 Personal history of nicotine dependence: Secondary | ICD-10-CM | POA: Insufficient documentation

## 2015-07-01 DIAGNOSIS — Z981 Arthrodesis status: Secondary | ICD-10-CM | POA: Insufficient documentation

## 2015-07-01 DIAGNOSIS — R5383 Other fatigue: Secondary | ICD-10-CM | POA: Diagnosis not present

## 2015-07-01 DIAGNOSIS — K219 Gastro-esophageal reflux disease without esophagitis: Secondary | ICD-10-CM | POA: Insufficient documentation

## 2015-07-01 DIAGNOSIS — M62838 Other muscle spasm: Secondary | ICD-10-CM | POA: Diagnosis present

## 2015-07-01 DIAGNOSIS — G709 Myoneural disorder, unspecified: Secondary | ICD-10-CM | POA: Diagnosis not present

## 2015-07-01 DIAGNOSIS — G4700 Insomnia, unspecified: Secondary | ICD-10-CM | POA: Insufficient documentation

## 2015-07-01 DIAGNOSIS — Z8669 Personal history of other diseases of the nervous system and sense organs: Secondary | ICD-10-CM | POA: Insufficient documentation

## 2015-07-01 DIAGNOSIS — M25462 Effusion, left knee: Secondary | ICD-10-CM | POA: Insufficient documentation

## 2015-07-01 DIAGNOSIS — R52 Pain, unspecified: Secondary | ICD-10-CM

## 2015-07-01 DIAGNOSIS — R531 Weakness: Secondary | ICD-10-CM | POA: Diagnosis not present

## 2015-07-01 DIAGNOSIS — M25562 Pain in left knee: Secondary | ICD-10-CM | POA: Diagnosis not present

## 2015-07-01 DIAGNOSIS — M1990 Unspecified osteoarthritis, unspecified site: Secondary | ICD-10-CM | POA: Insufficient documentation

## 2015-07-01 LAB — CBC WITH DIFFERENTIAL/PLATELET
Basophils Absolute: 0.1 K/uL (ref 0–0.1)
Basophils Relative: 1 %
Eosinophils Absolute: 0.6 K/uL (ref 0–0.7)
Eosinophils Relative: 8 %
HCT: 35.5 % (ref 35.0–47.0)
Hemoglobin: 11.3 g/dL — ABNORMAL LOW (ref 12.0–16.0)
Lymphocytes Relative: 13 %
Lymphs Abs: 1 K/uL (ref 1.0–3.6)
MCH: 25.8 pg — ABNORMAL LOW (ref 26.0–34.0)
MCHC: 31.8 g/dL — ABNORMAL LOW (ref 32.0–36.0)
MCV: 81.1 fL (ref 80.0–100.0)
Monocytes Absolute: 0.7 K/uL (ref 0.2–0.9)
Monocytes Relative: 10 %
Neutro Abs: 5.1 K/uL (ref 1.4–6.5)
Neutrophils Relative %: 68 %
Platelets: 215 K/uL (ref 150–440)
RBC: 4.37 MIL/uL (ref 3.80–5.20)
RDW: 16.2 % — ABNORMAL HIGH (ref 11.5–14.5)
WBC: 7.5 K/uL (ref 3.6–11.0)

## 2015-07-01 LAB — BASIC METABOLIC PANEL WITH GFR
Anion gap: 7 (ref 5–15)
BUN: 21 mg/dL — ABNORMAL HIGH (ref 6–20)
CO2: 27 mmol/L (ref 22–32)
Calcium: 9.1 mg/dL (ref 8.9–10.3)
Chloride: 106 mmol/L (ref 101–111)
Creatinine, Ser: 0.8 mg/dL (ref 0.44–1.00)
GFR calc Af Amer: >60 mL/min
GFR calc non Af Amer: >60 mL/min
Glucose, Bld: 164 mg/dL — ABNORMAL HIGH (ref 65–99)
Potassium: 3.7 mmol/L (ref 3.5–5.1)
Sodium: 140 mmol/L (ref 135–145)

## 2015-07-01 LAB — MAGNESIUM: Magnesium: 1.7 mg/dL (ref 1.7–2.4)

## 2015-07-01 MED ORDER — KETOROLAC TROMETHAMINE 30 MG/ML IJ SOLN
15.0000 mg | Freq: Once | INTRAMUSCULAR | Status: AC
  Administered 2015-07-01: 15 mg via INTRAVENOUS

## 2015-07-01 MED ORDER — LORAZEPAM 2 MG/ML IJ SOLN
1.0000 mg | Freq: Once | INTRAMUSCULAR | Status: AC
  Administered 2015-07-01: 1 mg via INTRAVENOUS
  Filled 2015-07-01: qty 1

## 2015-07-01 MED ORDER — FENTANYL CITRATE (PF) 100 MCG/2ML IJ SOLN
25.0000 ug | Freq: Once | INTRAMUSCULAR | Status: AC | PRN
  Administered 2015-07-01: 25 ug via INTRAVENOUS
  Filled 2015-07-01: qty 2

## 2015-07-01 MED ORDER — KETOROLAC TROMETHAMINE 30 MG/ML IJ SOLN
INTRAMUSCULAR | Status: AC
  Filled 2015-07-01: qty 1

## 2015-07-01 NOTE — ED Notes (Signed)
Patient transported to X-ray via stretcher 

## 2015-07-01 NOTE — ED Notes (Signed)
Pt presents to ED via EMS with leg pain that started around 1800. Pt states she was sitting down when the pain started. Pt states she normally walks with a walker. Pt has hx of MS and restless leg syndrome. Per EMS, pt stated pain feels like spasm and cramping. Pt has taken tylenol for the pain.

## 2015-07-01 NOTE — ED Notes (Addendum)
Pt is laying in bed with legs drawn up. When Dr. Shaune Pollack examined pt, pt could not straighten legs. A few minutes later, pt was able to straighten legs and lay in bed. Pt has been able to roll from side to side in bed while [place], RN cleaned pt and changed pt. When pt moves, pt groans, draws legs up. Husband is at bedside.

## 2015-07-01 NOTE — Discharge Instructions (Signed)
You were evaluated for lower extremity spasms, and although no certain cause was found, your exam is reassuring.  Return to the emergency room for any worsening pain or muscle spasm, or any other concerns such as swelling, skin rash, or fever.  Follow-up with a primary care physician.   Muscle Cramps and Spasms Muscle cramps and spasms occur when a muscle or muscles tighten and you have no control over this tightening (involuntary muscle contraction). They are a common problem and can develop in any muscle. The most common place is in the calf muscles of the leg. Both muscle cramps and muscle spasms are involuntary muscle contractions, but they also have differences:   Muscle cramps are sporadic and painful. They may last a few seconds to a quarter of an hour. Muscle cramps are often more forceful and last longer than muscle spasms.  Muscle spasms may or may not be painful. They may also last just a few seconds or much longer. CAUSES  It is uncommon for cramps or spasms to be due to a serious underlying problem. In many cases, the cause of cramps or spasms is unknown. Some common causes are:   Overexertion.   Overuse from repetitive motions (doing the same thing over and over).   Remaining in a certain position for a long period of time.   Improper preparation, form, or technique while performing a sport or activity.   Dehydration.   Injury.   Side effects of some medicines.   Abnormally low levels of the salts and ions in your blood (electrolytes), especially potassium and calcium. This could happen if you are taking water pills (diuretics) or you are pregnant.  Some underlying medical problems can make it more likely to develop cramps or spasms. These include, but are not limited to:   Diabetes.   Parkinson disease.   Hormone disorders, such as thyroid problems.   Alcohol abuse.   Diseases specific to muscles, joints, and bones.   Blood vessel disease where  not enough blood is getting to the muscles.  HOME CARE INSTRUCTIONS   Stay well hydrated. Drink enough water and fluids to keep your urine clear or pale yellow.  It may be helpful to massage, stretch, and relax the affected muscle.  For tight or tense muscles, use a warm towel, heating pad, or hot shower water directed to the affected area.  If you are sore or have pain after a cramp or spasm, applying ice to the affected area may relieve discomfort.  Put ice in a plastic bag.  Place a towel between your skin and the bag.  Leave the ice on for 15-20 minutes, 03-04 times a day.  Medicines used to treat a known cause of cramps or spasms may help reduce their frequency or severity. Only take over-the-counter or prescription medicines as directed by your caregiver. SEEK MEDICAL CARE IF:  Your cramps or spasms get more severe, more frequent, or do not improve over time.  MAKE SURE YOU:   Understand these instructions.  Will watch your condition.  Will get help right away if you are not doing well or get worse.   This information is not intended to replace advice given to you by your health care provider. Make sure you discuss any questions you have with your health care provider.   Document Released: 01/01/2002 Document Revised: 11/06/2012 Document Reviewed: 06/28/2012 Elsevier Interactive Patient Education [employer]

## 2015-07-01 NOTE — ED Provider Notes (Signed)
[facility] Emergency Department Provider Note   ____________________________________________  Time seen: Upon EMS arrival I have reviewed the triage vital signs and the triage nursing note.  HISTORY  Chief Complaint Leg Pain   Historian Patient and spouse  HPI Brandi Gibbs is a 69 y.o. female with a history of MS and muscle spasms who is here with left thigh muscle spasm pain. Started this evening around 6. She is normally able to extend her leg straight and walk with a walker, but right now her legs are drawn up and stiff. Pain is moderate to severe. No lower sure he swelling. No abdominal pain. No chest pain. No exacerbating or alleviating factors. No change in medications.    Past Medical History  Diagnosis Date  . Multiple sclerosis (HCC)   . Movement disorder   . Hypertension   . Vision abnormalities   . Restless leg syndrome   . Allergy   . Anxiety   . Arthritis   . GERD (gastroesophageal reflux disease)   . Neuromuscular disorder (HCC)   . Seizures (HCC)   . Thyroid disease     Patient Active Problem List   Diagnosis Date Noted  . Right shoulder pain 12/24/2014  . Arthralgia of shoulder 12/24/2014  . Altered mental state 12/06/2014  . Abnormal mental state 12/06/2014  . Ataxic gait 09/11/2014  . Cognitive changes 09/11/2014  . Other fatigue 09/11/2014  . Urinary hesitancy 09/11/2014  . Spasticity 09/11/2014  . Midline low back pain without sciatica 09/11/2014  . Insomnia 09/11/2014  . Restless leg 09/11/2014  . Snoring 09/11/2014  . Abnormal involuntary movement 09/11/2014  . Abnormal gait 09/11/2014  . Abnormal respiratory rate 09/11/2014  . Other symptoms and signs involving cognitive functions and awareness 09/11/2014  . LBP (low back pain) 09/11/2014  . Does not feel right 09/11/2014  . DS (disseminated sclerosis) (HCC) 03/28/2014  . Osteopenia 03/28/2014  . Hyperthyroidism 03/28/2014  . BP (high blood pressure)  03/28/2014  . HLD (hyperlipidemia) 03/28/2014  . Essential (primary) hypertension 03/28/2014  . 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 Multiple sclerosis (HCC) 03/28/2014    Past Surgical History  Procedure Laterality Date  . Partial hysterectomy    . Back surgery      Current Outpatient Rx  Name  Route  Sig  Dispense  Refill  . baclofen (LIORESAL) 10 MG tablet   Oral   Take 1 tablet (10 mg total) by mouth 3 (three) times daily.   90 each   11   . diltiazem (DILACOR XR) 180 MG 24 hr capsule   Oral   Take 180 mg by mouth daily.         Marland Kitchen lisinopril (PRINIVIL,ZESTRIL) 40 MG tablet   Oral   Take 40 mg by mouth daily.          . metoprolol tartrate (LOPRESSOR) 25 MG tablet   Oral   Take 25 mg by mouth 2 (two) times daily.         . pramipexole (MIRAPEX) 0.5 MG tablet   Oral   Take 1 tablet (0.5 mg total) by mouth at bedtime.   30 tablet   11   . simvastatin (ZOCOR) 40 MG tablet   Oral   Take 40 mg by mouth daily.         . Teriflunomide 14 MG TABS   Oral   Take 14 mg by mouth daily.   90 tablet   3   .  oxybutynin (DITROPAN) 5 MG tablet   Oral   Take 1 tablet (5 mg total) by mouth 3 (three) times daily. Patient not taking: Reported on 07/01/2015   90 tablet   11     Allergies Other; Prednisone; and Omeprazole  Family History  Problem Relation Age of Onset  . Leukemia Mother   . Healthy Father     Social History Social History  Substance Use Topics  . Smoking status: Former Smoker    Types: Cigarettes  . Smokeless tobacco: None  . Alcohol Use: No     Comment: rare/fim    Review of Systems  Constitutional: Negative for fever. Eyes: Negative for visual changes. ENT: Negative for sore throat. Cardiovascular: Negative for chest pain. Respiratory: Negative for shortness of breath. Gastrointestinal: Negative for abdominal pain, vomiting and diarrhea. Genitourinary: Negative for dysuria. Musculoskeletal: Negative  for back pain. Skin: Negative for rash. Neurological: Negative for headache. 10 point Review of Systems otherwise negative ____________________________________________   PHYSICAL EXAM:  VITAL SIGNS: ED Triage Vitals  Enc Vitals Group     BP --      Pulse --      Resp --      Temp --      Temp src --      SpO2 --      Weight --      Height --      Head Cir --      Peak Flow --      Pain Score --      Pain Loc --      Pain Edu? --      Excl. in GC? --      Constitutional: Alert and oriented. Chronically ill-appearing, and wincing in pain grabbing her left thigh.. Eyes: Conjunctivae are normal. PERRL. Normal extraocular movements. ENT   Head: Normocephalic and atraumatic.   Nose: No congestion/rhinnorhea.   Mouth/Throat: Mucous membranes are moist.   Neck: No stridor. Cardiovascular/Chest: Normal rate, regular rhythm.  No murmurs, rubs, or gallops. Respiratory: Normal respiratory effort without tachypnea nor retractions. Breath sounds are clear and equal bilaterally. No wheezes/rales/rhonchi. Gastrointestinal: Soft. No distention, no guarding, no rebound. Nontender  Genitourinary/rectal:Deferred Musculoskeletal: Hips flexed and knees flexed, with tight muscle spasm tender to palpation especially in the left thigh and left calf. No edema. Neurologic:  Normal speech and language. No gross or focal neurologic deficits are appreciated. Skin:  Skin is warm, dry and intact. No rash noted. Psychiatric: Mood and affect are normal. Speech and behavior are normal. Patient exhibits appropriate insight and judgment.  ____________________________________________   EKG I, Governor Rooks, MD, the attending physician have personally viewed and interpreted all ECGs.  No EKG performed ____________________________________________  LABS (pertinent positives/negatives)  Basic metabolic panel and CBC without significant abnormalities Magnesium  1.7  ____________________________________________  RADIOLOGY All Xrays were viewed by me. Interpreted by me  Pelvis x-ray: No visualized fracture __________________________________________  PROCEDURES  Procedure(s) performed: None  Critical Care performed: None  ____________________________________________   ED COURSE / ASSESSMENT AND PLAN  CONSULTATIONS: Hospitalist for admission  Pertinent labs & imaging results that were available during my care of the patient were reviewed by me and considered in my medical decision making (see chart for details).   Clinically the patient is having significant muscle spasms of bilateral lower extremities with the left thigh worse. No change in medications recently. Unclear inciting event. No abdominal pain. Patient be treated symptomatically. I will check her electrolytes.  Laboratory evaluation is  reassuring.  Post x-ray was added on to rule out underlying fracture, although there is no reported traumatic injury per the husband and patient.  Patient still with significant pain and muscle spasm despite fairly somnolent after Ativan.  Patient was given Toradol and fentanyl. Patient will need to be admitted for management of leg pain presumed early due to MS exacerbation. Defer treatment with possible steroids to admitting physician.  Patient / Family / Caregiver informed of clinical course, medical decision-making process, and agree with plan.    ___________________________________________   FINAL CLINICAL IMPRESSION(S) / ED DIAGNOSES   Final diagnoses:  Muscle spasm of both lower legs  Multiple sclerosis exacerbation (HCC)       Governor Rooks, MD 07/01/15 2317

## 2015-07-02 ENCOUNTER — Observation Stay: Payer: Medicare Other

## 2015-07-02 DIAGNOSIS — G35 Multiple sclerosis: Secondary | ICD-10-CM | POA: Diagnosis not present

## 2015-07-02 LAB — GLUCOSE, CAPILLARY
Glucose-Capillary: 155 mg/dL — ABNORMAL HIGH (ref 65–99)
Glucose-Capillary: 205 mg/dL — ABNORMAL HIGH (ref 65–99)

## 2015-07-02 LAB — T4, FREE: FREE T4: 0.85 ng/dL (ref 0.61–1.12)

## 2015-07-02 LAB — HEMOGLOBIN A1C: Hgb A1c MFr Bld: 5.7 % (ref 4.0–6.0)

## 2015-07-02 LAB — TSH: TSH: 0.041 u[IU]/mL — ABNORMAL LOW (ref 0.350–4.500)

## 2015-07-02 MED ORDER — SIMVASTATIN 40 MG PO TABS
40.0000 mg | ORAL_TABLET | Freq: Every day | ORAL | Status: DC
  Administered 2015-07-02 – 2015-07-03 (×2): 40 mg via ORAL
  Filled 2015-07-02 (×2): qty 1

## 2015-07-02 MED ORDER — MORPHINE SULFATE (PF) 2 MG/ML IV SOLN: 2.0000 mg | INTRAVENOUS | Status: DC | PRN

## 2015-07-02 MED ORDER — PREDNISONE 20 MG PO TABS
60.0000 mg | ORAL_TABLET | Freq: Every day | ORAL | Status: DC
  Filled 2015-07-02: qty 3

## 2015-07-02 MED ORDER — METOPROLOL TARTRATE 25 MG PO TABS
25.0000 mg | ORAL_TABLET | Freq: Two times a day (BID) | ORAL | Status: DC
  Administered 2015-07-02 – 2015-07-03 (×3): 25 mg via ORAL
  Filled 2015-07-02 (×3): qty 1

## 2015-07-02 MED ORDER — METHIMAZOLE 10 MG PO TABS
10.0000 mg | ORAL_TABLET | Freq: Every day | ORAL | Status: DC
  Administered 2015-07-02 – 2015-07-03 (×2): 10 mg via ORAL
  Filled 2015-07-02 (×2): qty 1

## 2015-07-02 MED ORDER — ONDANSETRON HCL 4 MG PO TABS: 4.0000 mg | ORAL_TABLET | Freq: Four times a day (QID) | ORAL | Status: DC | PRN

## 2015-07-02 MED ORDER — LISINOPRIL 40 MG PO TABS
40.0000 mg | ORAL_TABLET | Freq: Once | ORAL | Status: AC
  Administered 2015-07-02: 40 mg via ORAL
  Filled 2015-07-02: qty 1

## 2015-07-02 MED ORDER — SODIUM CHLORIDE 0.9 % IJ SOLN: 3.0000 mL | Freq: Two times a day (BID) | INTRAMUSCULAR | Status: DC

## 2015-07-02 MED ORDER — LISINOPRIL 20 MG PO TABS
40.0000 mg | ORAL_TABLET | Freq: Every day | ORAL | Status: DC
  Administered 2015-07-02 – 2015-07-03 (×2): 40 mg via ORAL
  Filled 2015-07-02 (×2): qty 2

## 2015-07-02 MED ORDER — DOCUSATE SODIUM 100 MG PO CAPS
100.0000 mg | ORAL_CAPSULE | Freq: Two times a day (BID) | ORAL | Status: DC
  Administered 2015-07-02 (×2): 100 mg via ORAL
  Filled 2015-07-02 (×3): qty 1

## 2015-07-02 MED ORDER — INSULIN ASPART 100 UNIT/ML ~~LOC~~ SOLN
0.0000 [IU] | Freq: Three times a day (TID) | SUBCUTANEOUS | Status: DC
  Filled 2015-07-02: qty 2

## 2015-07-02 MED ORDER — DILTIAZEM HCL ER COATED BEADS 180 MG PO CP24: 180.0000 mg | ORAL_CAPSULE | Freq: Every day | ORAL | Status: DC

## 2015-07-02 MED ORDER — PRAMIPEXOLE DIHYDROCHLORIDE 0.25 MG PO TABS
0.5000 mg | ORAL_TABLET | Freq: Every day | ORAL | Status: DC
  Administered 2015-07-02: 0.5 mg via ORAL
  Filled 2015-07-02: qty 2

## 2015-07-02 MED ORDER — TERIFLUNOMIDE 14 MG PO TABS
14.0000 mg | ORAL_TABLET | Freq: Every day | ORAL | Status: DC
  Administered 2015-07-03: 14 mg via ORAL
  Filled 2015-07-02: qty 1

## 2015-07-02 MED ORDER — ONDANSETRON HCL 4 MG/2ML IJ SOLN: 4.0000 mg | Freq: Four times a day (QID) | INTRAMUSCULAR | Status: DC | PRN

## 2015-07-02 MED ORDER — ACETAMINOPHEN 325 MG PO TABS
650.0000 mg | ORAL_TABLET | Freq: Four times a day (QID) | ORAL | Status: DC | PRN
Start: 2015-07-02 — End: 2015-07-03

## 2015-07-02 MED ORDER — POTASSIUM CHLORIDE IN NACL 40-0.9 MEQ/L-% IV SOLN
INTRAVENOUS | Status: DC
  Administered 2015-07-02 – 2015-07-03 (×3): 100 mL/h via INTRAVENOUS
  Filled 2015-07-02 (×5): qty 1000

## 2015-07-02 MED ORDER — DILTIAZEM HCL ER COATED BEADS 240 MG PO CP24
240.0000 mg | ORAL_CAPSULE | Freq: Every day | ORAL | Status: DC
  Administered 2015-07-02 – 2015-07-03 (×2): 240 mg via ORAL
  Filled 2015-07-02 (×2): qty 1

## 2015-07-02 MED ORDER — BACLOFEN 10 MG PO TABS
10.0000 mg | ORAL_TABLET | Freq: Three times a day (TID) | ORAL | Status: DC
  Administered 2015-07-02 – 2015-07-03 (×4): 10 mg via ORAL
  Filled 2015-07-02 (×4): qty 1

## 2015-07-02 MED ORDER — NITROGLYCERIN 2 % TD OINT
0.5000 [in_us] | TOPICAL_OINTMENT | Freq: Four times a day (QID) | TRANSDERMAL | Status: DC
  Administered 2015-07-02 – 2015-07-03 (×7): 0.5 [in_us] via TOPICAL
  Filled 2015-07-02 (×7): qty 1

## 2015-07-02 MED ORDER — HEPARIN SODIUM (PORCINE) 5000 UNIT/ML IJ SOLN
5000.0000 [IU] | Freq: Three times a day (TID) | INTRAMUSCULAR | Status: DC
Start: 2015-07-02 — End: 2015-07-03
  Administered 2015-07-02 – 2015-07-03 (×4): 5000 [IU] via SUBCUTANEOUS
  Filled 2015-07-02 (×4): qty 1

## 2015-07-02 MED ORDER — METHYLPREDNISOLONE SODIUM SUCC 125 MG IJ SOLR
125.0000 mg | Freq: Once | INTRAMUSCULAR | Status: AC
  Administered 2015-07-02: 125 mg via INTRAVENOUS
  Filled 2015-07-02: qty 2

## 2015-07-02 MED ORDER — HYDRALAZINE HCL 20 MG/ML IJ SOLN
10.0000 mg | INTRAMUSCULAR | Status: DC | PRN
  Administered 2015-07-02 (×2): 10 mg via INTRAVENOUS
  Filled 2015-07-02 (×2): qty 1

## 2015-07-02 MED ORDER — ACETAMINOPHEN 650 MG RE SUPP: 650.0000 mg | Freq: Four times a day (QID) | RECTAL | Status: DC | PRN

## 2015-07-02 MED ORDER — INSULIN ASPART 100 UNIT/ML ~~LOC~~ SOLN: 0.0000 [IU] | Freq: Every day | SUBCUTANEOUS | Status: DC

## 2015-07-02 NOTE — Progress Notes (Signed)
Pt. did NIF of -18, that was the best of 3.

## 2015-07-02 NOTE — Clinical Social Work Note (Signed)
Clinical Social Work Assessment  Patient Details  Name: Brandi Gibbs MRN: [id number] Date of Birth: [birth year]  Date of referral:  07/02/15               Reason for consult:  Facility Placement, Other (Comment Required) (Patient is refusing SNF )                Permission sought to share information with:  Case Manager Permission granted to share information::  Yes, Verbal Permission Granted  Name::        Agency::     Relationship::     Contact Information:     Housing/Transportation Living arrangements for the past 2 months:  Single Family Home Source of Information:  Patient Patient Interpreter Needed:  None Criminal Activity/Legal Involvement Pertinent to Current Situation/Hospitalization:  No - Comment as needed Significant Relationships:  Spouse Lives with:  Spouse Do you feel safe going back to the place where you live?  Yes Need for family participation in patient care:  Yes (Comment)  Care giving concerns:  Patient lives in [place] with her husband Brandi Gibbs.    Social Worker assessment / plan: Holiday representative (CSW) received verbal consult from PT that patient needs SNF. CSW met with patient to discuss D/C plan. Patient was laying in bed and was alert and oriented. Patient reported that she lives in [place] with her husband Brandi Gibbs. Per patient they do have children but not together. Per patient she has a walker and an electric wheel chair. Patient reported that she did have a hospital bed but got rid of it because it was uncomfortable. Patient reported that she has PCS services 2 hours per day 7 days a week. Per patient she was recently at [facility] and stayed for 3 months. CSW discussed SNF placement with patient. CSW explained that PT is recommending short term rehab however she is under Medicare observation, which will not pay for rehab. CSW discussed long term care with patient. Patient refused SNF and reported that she is going home. Patient reported that  she has been to SNF before and does not want to go back. Patient was adamant about remaining at home. CSW provided emotional support. RN Case Manager aware of above. Please reconsult if future social work needs arise. CSW signing off.    Employment status:  Disabled (Comment on whether or not currently receiving Disability), Retired Forensic scientist:  Information systems manager, Medicaid In [place] PT Recommendations:  [place] (Patient is refusing SNF ) Information / Referral to community resources:  Other (Comment Required) (RN Case Manager will arrange home health if needed. )  Patient/Family's Response to care: Patient is agreeable to going home with home health. Patient refused SNF.   Patient/Family's Understanding of and Emotional Response to Diagnosis, Current Treatment, and Prognosis: Patient was pleasant throughout assessment.   Emotional Assessment Appearance:  Appears stated age Attitude/Demeanor/Rapport:    Affect (typically observed):  Accepting, Adaptable, Pleasant Orientation:  Oriented to Self, Oriented to Place, Oriented to  Time, Oriented to Situation Alcohol / Substance use:  Not Applicable Psych involvement (Current and /or in the community):  No (Comment)  Discharge Needs  Concerns to be addressed:  Discharge Planning Concerns Readmission within the last 30 days:  No Current discharge risk:  Dependent with Mobility Barriers to Discharge:  Continued Medical Work up   Loralyn Freshwater, LCSW 07/02/2015, 2:24 PM

## 2015-07-02 NOTE — Progress Notes (Addendum)
   07/02/15 1027  Clinical Encounter Type  Visited With Patient  Visit Type Initial  Consult/Referral To Chaplain  Spiritual Encounters  Spiritual Needs Emotional  Stress Factors  Patient Stress Factors Health changes;Loss of control;Lack of caregivers  Chaplain rounded in unit and offered pastoral care.   Chaplain Kanyon Seibold [phone]

## 2015-07-02 NOTE — Plan of Care (Signed)
Problem: Acute Rehab PT Goals(only PT should resolve) Goal: Pt will Roll Supine to Side Pt will demonstrate minA rolling in bed with less than 2/10 pain to demonstrate return to PLOF for return to home s/p DC.  Goal: Pt Will Go Supine/Side To Sit Pt will demonstrate ModI bed mobility supine to sitting edge-of-bed to return to PLOF and to decrease caregiver burden.      Goal: Patient Will Perform Sitting Balance Pt will transfer sit to/from-stand with LRAD at [facility] without loss-of-balance to demonstrate good safety awareness for independent mobility in home.     Goal: Pt Will Ambulate Pt will ambulate with RW with modified independence for a distances greater than 22ft to demonstrate the ability to perform safe household distance ambulation at discharge.

## 2015-07-02 NOTE — Care Management Note (Addendum)
Case Management Note  Patient Details  Name: Brandi Gibbs MRN: [id number] Date of Birth: 10-03-1945  Subjective/Objective:                  Met with patient and her husband to discuss discharge planning. Patient is from home with her husband. She uses a walker to ambulate. She states that she has not been able to walk related to knee pain. Her PCP is Dr. Lovie Macadamia. She uses Financial planner for Rx. She is received PCS services through [facility]- husband is not happy with service provider although he has tried several.I spoke with Gena at [facility] and patient is with [facility] [phone]).Complaint has been filed in regards to husband's request with [facility]. Husband said that aide comes out to his home and "just sits there doesn't do anything; leaves early". He has tried to apply for [facility] [phone]. He has not tried to apply for [facility] (contact number shared with husband). She is MEDICARE OBSERVATION which will NOT pay for SNF. O2 is new. She was at [facility] recently per [facility]: ?August/Sept of this year.   Action/Plan: List of home health care agencies left with patient. Spoke with Aida Puffer at [facility]- she said that "they have provided him with the amount of care allowed". Eduard Clos is her Transport planner). [facility] does not have any more assistance.   Expected Discharge Date:  07/04/15               Expected Discharge Plan:     In-House Referral:     Discharge planning Services  CM Consult  Post Acute Care Choice:  Home Health Choice offered to:  Patient, Spouse  DME Arranged:    DME Agency:     HH Arranged:    [place] Agency:     Status of Service:  In process, will continue to follow  Medicare Important Message Given:    Date Medicare IM Given:    Medicare IM give by:    Date Additional Medicare IM Given:    Additional Medicare Important Message give by:     If discussed at [place] of Stay Meetings, dates  discussed:    Additional Comments: Met privately with patient (husband was not present) and she refused long term placement at SNF. She has been advised the PCS services could not provide increased services at this time. She acknowledged.   Marshell Garfinkel, RN 07/02/2015, 10:56 AM

## 2015-07-02 NOTE — Consult Note (Signed)
ORTHOPAEDIC CONSULTATION  REQUESTING PHYSICIAN: Adrian Saran, MD  Chief Complaint: Left knee pain  HPI: Brandi Gibbs is a 69 y.o. female who was admitted for multiple sclerosis exacerbation complained of left knee pain. Patient states he does not have a history of left knee pain but has had problems with the right knee in the past. Her right knee is currently not bothering her. She was having difficulty moving the left knee earlier today and had pain and swelling.  Past Medical History  Diagnosis Date  . Multiple sclerosis (HCC)   . Movement disorder   . Hypertension   . Vision abnormalities   . Restless leg syndrome   . Allergy   . Anxiety   . Arthritis   . GERD (gastroesophageal reflux disease)   . Neuromuscular disorder (HCC)   . Seizures (HCC)   . Thyroid disease    Past Surgical History  Procedure Laterality Date  . Partial hysterectomy    . Back surgery     Social History   Social History  . Marital Status: Married    Spouse Name: N/A  . Number of Children: N/A  . Years of Education: N/A   Social History Main Topics  . Smoking status: Former Smoker    Types: Cigarettes  . Smokeless tobacco: None  . Alcohol Use: No     Comment: rare/fim  . Drug Use: No  . Sexual Activity: No   Other Topics Concern  . None   Social History Narrative   Family History  Problem Relation Age of Onset  . Leukemia Mother   . Healthy Father    Allergies  Allergen Reactions  . Other Other (See Comments)    Blood thinners - GI bleed  . Prednisone Other (See Comments)    Causes hair to fall out   . Omeprazole Rash   Prior to Admission medications   Medication Sig Start Date End Date Taking? Authorizing Provider  baclofen (LIORESAL) 10 MG tablet Take 1 tablet (10 mg total) by mouth 3 (three) times daily. 04/28/15  Yes Asa Lente, MD  diltiazem (DILACOR XR) 180 MG 24 hr capsule Take 180 mg by mouth daily.   Yes Historical Provider, MD  lisinopril (PRINIVIL,ZESTRIL) 40  MG tablet Take 40 mg by mouth daily.    Yes Historical Provider, MD  metoprolol tartrate (LOPRESSOR) 25 MG tablet Take 25 mg by mouth 2 (two) times daily.   Yes Historical Provider, MD  pramipexole (MIRAPEX) 0.5 MG tablet Take 1 tablet (0.5 mg total) by mouth at bedtime. 04/28/15  Yes Asa Lente, MD  simvastatin (ZOCOR) 40 MG tablet Take 40 mg by mouth daily.   Yes Historical Provider, MD  Teriflunomide 14 MG TABS Take 14 mg by mouth daily. 03/17/15  Yes Asa Lente, MD  oxybutynin (DITROPAN) 5 MG tablet Take 1 tablet (5 mg total) by mouth 3 (three) times daily. Patient not taking: Reported on 07/01/2015 04/28/15   Asa Lente, MD   Dg Pelvis 1-2 Views  07/01/2015  CLINICAL DATA:  Acute onset of lower extremity cramping and spasms. Initial encounter. EXAM: PELVIS - 1-2 VIEW COMPARISON:  Abdominal radiograph performed 10/11/2014 FINDINGS: There is no evidence of fracture or dislocation. Both femoral heads are seated normally within their respective acetabula. No significant degenerative change is appreciated. The sacroiliac joints are unremarkable in appearance. Lumbar spinal fusion hardware is noted at L4-L5. The visualized bowel gas pattern is grossly unremarkable in appearance. Scattered phleboliths are noted within  the pelvis. A large chronic osseous fragment is noted superior to the left femoral neck, possibly within the overlying soft tissues. IMPRESSION: No evidence of fracture or dislocation. Electronically Signed   By: Roanna Raider M.D.   On: 07/01/2015 23:23   Mr Knee Left  Wo Contrast  07/02/2015  CLINICAL DATA:  69 year old with anterior knee pain and limited range of motion for 1 day. No acute injury or prior relevant surgery. Initial encounter. EXAM: MRI OF THE LEFT KNEE WITHOUT CONTRAST TECHNIQUE: Multiplanar, multisequence MR imaging of the knee was performed. No intravenous contrast was administered. COMPARISON:  None. FINDINGS: MENISCI Medial meniscus: Extensive free edge  degenerative tearing of the meniscal body and posterior horn. There is probable ossification peripherally in the posterior horn. The meniscal body is largely extruded from the joint. No centrally displaced meniscal fragment identified. Lateral meniscus:  Intact with normal morphology. LIGAMENTS Cruciates:  Intact. Collaterals: Intact. There is MCL degeneration and medial buckling related to the meniscal extrusion. CARTILAGE Patellofemoral: Relatively mild patellar chondral thinning and surface irregularity with prominent osteophytes. Medial: Advanced osteoarthritis with diffuse chondral thinning, subchondral eburnation and large osteophytes. Lateral: Mild chondral thinning with prominent osteophyte formation. OTHER Joint: Moderate-sized joint effusion. There are multiple intra-articular loose bodies. Loose body in the suprapatellar recess measures up to 1.8 cm. There is a large loose body posteriorly, measuring up to 3.1 cm. This demonstrates heterogeneous T1 and T2 signal. Popliteal Fossa: Large posterior loose bodies as described above. No significant Baker's cyst. Extensor Mechanism:  Intact. Bones:  No acute or significant extra-articular osseous findings. IMPRESSION: 1. 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 2. Multiple intra-articular loose bodies, largest posteriorly. These likely contribute to the patient's limited range of motion. 3. Diffuse degenerative tearing of the posterior horn and body of the medial meniscus with associated meniscal ossification. 4. The lateral meniscus, cruciate and collateral ligaments are intact. Electronically Signed   By: Carey Bullocks M.D.   On: 07/02/2015 16:31    Positive ROS: All other systems have been reviewed and were otherwise negative with the exception of those mentioned in the HPI and as above.  Physical Exam: General: Alert, no acute distress  MUSCULOSKELETAL: Left knee: Patient is examined in her room this evening. Her nurse,  Tobi Bastos, stopped by the room during my patient encounter. Patient's left knee shows no evidence of erythema or significant effusion. She can flex to approximately 110 and fully extends without significant pain. She had no point tenderness on exam. There is no ligamentous laxity. She has palpable pedal pulses and intact sensation light touch in both lower extremities. She had intact motor function as well.  Assessment: Flareup of underlying arthritis  Plan: Patient had an MRI earlier today which showed tricompartmental osteophyte arthritis changes. These changes are most prominent in the medial compartment. She had multiple intra-articular loose bodies as well. I have reviewed the MRI images personally. Based on these findings and the patient's pain and swelling I believe her symptoms are related to a flareup of underlying arthritis. She has received steroid medication for her multiple sclerosis exacerbation. This is likely help to improve her left knee symptoms. I'm ordering plain films of the left knee to further evaluate the extent of her arthritis. Given her clinical improvement I would not recommend any intervention at this time. She may continue physical therapy as tolerated and her pain should be treated symptomatically. She may follow up in my office for further arthritis workup and treatment as needed.  Juanell Fairly, MD    07/02/2015 5:57 PM

## 2015-07-02 NOTE — Evaluation (Signed)
Physical Therapy Evaluation Patient Details Name: Brandi Gibbs MRN: [id number] DOB: 11-23-1945 Today's Date: 07/02/2015   History of Present Illness  Pt is 69yo white female with PMH of MS x 40years. Pt recently began to feel lethargic at home and sustained a fall. Pt now at [facility] being treated with MS exacerbation. Pt c/o L knee/hip pain 10/10 with movement, 0/10 at rest. CT was negative and pt is now pending MRI for further evaluation.   Clinical Impression  Pt is received semirecumbent in bed upon entry, awake, alert, and willing to participate. Mild distress noted with L knee pain, and in L hip with movement. Pt is drowsy &Ox3 and pleasant. Strength assessment revealing WFL in the BUE, and moderate weakness in the BLE, with the Left side too limited by pain for in-depth assessment. Pt remaining on 2L O2 throughout evaluation, remaining >95% throughout, although patient does not use O2 at baseline. Patient presenting with impairment of strength, range of motion, oxygen perfusion, and activity tolerance, limiting ability to perform ADL and mobility tasks at  baseline level of function. Patient will benefit from skilled intervention to address the above impairments and limitations, in order to restore to prior level of function, improve patient safety upon discharge, and to decrease falls risk.        Follow Up Recommendations SNF    Equipment Recommendations  None recommended by PT    Recommendations for Other Services       Precautions / Restrictions Precautions Precautions: None Precaution Comments: Sustained a fall at home prior to admission.  Restrictions Weight Bearing Restrictions: No      Mobility  Bed Mobility               General bed mobility comments: Attempted OOB; sharp sudden increase in pain at the L hip, unable to move forward. MRI pending.   Transfers                    Ambulation/Gait                Stairs            Wheelchair  Mobility    Modified Rankin (Stroke Patients Only)       Balance                                             Pertinent Vitals/Pain Pain Assessment: 0-10 Pain Score: 10-Worst pain ever Pain Location: L anterior hip joint, especially worse with AROM and guarding. 0/10 at rest.  Pain Intervention(s): Limited activity within patient's tolerance;Monitored during session;Repositioned    Home Living Family/patient expects to be discharged to:: Private residence Living Arrangements: Spouse/significant other Available Help at Discharge: Family Type of Home: House Home Access: Ramped entrance     Home Layout: One level Home Equipment: Environmental consultant - 2 wheels;Wheelchair - power;Wheelchair - manual;Cane - single point      Prior Function Level of Independence: Needs assistance   Gait / Transfers Assistance Needed: Household ambulation only, reports being very worn out by short distances;   ADL's / Homemaking Assistance Needed: Independent with ADL.         Hand Dominance   Dominant Hand: Right    Extremity/Trunk Assessment   Upper Extremity Assessment: Overall WFL for tasks assessed (Grip strength excellent and equal; MMT screening is 5/5. )  Lower Extremity Assessment: LLE deficits/detail (Unable to assess OOB due to pain with mobility. ) RLE Deficits / Details: Ankle PF strong, DF very weak and limited in ROM.  LLE Deficits / Details: Pain in knee with AAROM, ankle is strong in PF/DF with limited ROM into PF contracture. Hip pain making hip assessment unbearable.      Communication   Communication: No difficulties (mild dysarthria, unable to determine if baseline. )  Cognition Arousal/Alertness: Lethargic Behavior During Therapy: WFL for tasks assessed/performed Overall Cognitive Status: Within Functional Limits for tasks assessed                      General Comments      Exercises        Assessment/Plan    PT Assessment  Patient needs continued PT services  PT Diagnosis Generalized weakness;Difficulty walking;Acute pain   PT Problem List Decreased strength;Decreased range of motion;Decreased activity tolerance;Decreased mobility;Pain  PT Treatment Interventions Functional mobility training;Therapeutic activities;Therapeutic exercise   PT Goals (Current goals can be found in the Care Plan section) Acute Rehab PT Goals Patient Stated Goal: Return to home, decrease pain, regain energy PT Goal Formulation: With patient Time For Goal Achievement: 07/16/15 Potential to Achieve Goals: Fair    Frequency Min 2X/week   Barriers to discharge Decreased caregiver support      Co-evaluation               End of Session Equipment Utilized During Treatment: Oxygen Activity Tolerance: Patient tolerated treatment well;Patient limited by pain Patient left: in bed;with nursing/sitter in room;with family/visitor present Nurse Communication: Mobility status (Need for additional assessment of L hip. )    Functional Limitation: Changing and maintaining body position Changing and Maintaining Body Position Current Status (U0454): At least 80 percent but less than 100 percent impaired, limited or restricted Changing and Maintaining Body Position Goal Status (U9811): At least 60 percent but less than 80 percent impaired, limited or restricted    Time: 0842-0901 PT Time Calculation (min) (ACUTE ONLY): 19 min   Charges:   PT Evaluation $Initial PT Evaluation Tier I: 1 Procedure     PT G Codes:   PT G-Codes **NOT FOR INPATIENT CLASS** Functional Limitation: Changing and maintaining body position Changing and Maintaining Body Position Current Status (B1478): At least 80 percent but less than 100 percent impaired, limited or restricted Changing and Maintaining Body Position Goal Status (G9562): At least 60 percent but less than 80 percent impaired, limited or restricted    Buccola,Allan C 07/02/2015, 9:24 AM  9:28  AM  Rosamaria Lints, PT, DPT [place] License # [id number]

## 2015-07-02 NOTE — H&P (Signed)
Brandi Gibbs is an 69 y.o. female.   Chief Complaint: Leg pain and weakness HPI: The patient presents emergency department due to lower extremity pain, fatigue and weakness.  Most history is obtained from her husband as the patient is very somnolent. Past medical history is significant for multiple sclerosis.  He reports that she had been in her usual state of health this morning and had been trying to decorate the house for Christmas. She indicates with her walker but became so weak that at one point she fell and complained of foot pain. She did not strike her head or lose consciousness. However, she was very tired by this afternoon and lay down on the couch. Patient's husband reports that she slept for 3 hours. When she awoke she complained of cramping in her legs. In the emergency department workup was essentially unremarkable but the emergency department staff asked for admission for treatment of MS exacerbation.  Past Medical History  Diagnosis Date  . Multiple sclerosis ([place])   . Movement disorder   . Hypertension   . Vision abnormalities   . Restless leg syndrome   . Allergy   . Anxiety   . Arthritis   . GERD (gastroesophageal reflux disease)   . Neuromuscular disorder ([place])   . Seizures ([place])   . Thyroid disease     Past Surgical History  Procedure Laterality Date  . Partial hysterectomy    . Back surgery      Family History  Problem Relation Age of Onset  . Leukemia Mother   . Healthy Father    Social History:  reports that she has quit smoking. Her smoking use included Cigarettes. She does not have any smokeless tobacco history on file. She reports that she does not drink alcohol or use illicit drugs.  Allergies:  Allergies  Allergen Reactions  . Other Other (See Comments)    Blood thinners - GI bleed  . Prednisone Other (See Comments)    Causes hair to fall out   . Omeprazole Rash    Prior to Admission medications   Medication Sig Start Date End Date Taking?  Authorizing Provider  baclofen (LIORESAL) 10 MG tablet Take 1 tablet (10 mg total) by mouth 3 (three) times daily. 04/28/15  Yes Britt Bottom, MD  diltiazem (DILACOR XR) 180 MG 24 hr capsule Take 180 mg by mouth daily.   Yes Historical Provider, MD  lisinopril (PRINIVIL,ZESTRIL) 40 MG tablet Take 40 mg by mouth daily.    Yes Historical Provider, MD  metoprolol tartrate (LOPRESSOR) 25 MG tablet Take 25 mg by mouth 2 (two) times daily.   Yes Historical Provider, MD  pramipexole (MIRAPEX) 0.5 MG tablet Take 1 tablet (0.5 mg total) by mouth at bedtime. 04/28/15  Yes Britt Bottom, MD  simvastatin (ZOCOR) 40 MG tablet Take 40 mg by mouth daily.   Yes Historical Provider, MD  Teriflunomide 14 MG TABS Take 14 mg by mouth daily. 03/17/15  Yes Britt Bottom, MD  oxybutynin (DITROPAN) 5 MG tablet Take 1 tablet (5 mg total) by mouth 3 (three) times daily. Patient not taking: Reported on 07/01/2015 04/28/15   Britt Bottom, MD     Results for orders placed or performed during the hospital encounter of 07/01/15 (from the past 48 hour(s))  Basic metabolic panel     Status: Abnormal   Collection Time: 07/01/15  9:36 PM  Result Value Ref Range   Sodium 140 135 - 145 mmol/L   Potassium  3.7 3.5 - 5.1 mmol/L   Chloride 106 101 - 111 mmol/L   CO2 27 22 - 32 mmol/L   Glucose, Bld 164 (H) 65 - 99 mg/dL   BUN 21 (H) 6 - 20 mg/dL   Creatinine, Ser 0.80 0.44 - 1.00 mg/dL   Calcium 9.1 8.9 - 10.3 mg/dL   GFR calc non Af Amer >60 >60 mL/min   GFR calc Af Amer >60 >60 mL/min    Comment: (NOTE) The eGFR has been calculated using the CKD EPI equation. This calculation has not been validated in all clinical situations. eGFR's persistently <60 mL/min signify possible Chronic Kidney Disease.    Anion gap 7 5 - 15  CBC with Differential     Status: Abnormal   Collection Time: 07/01/15  9:36 PM  Result Value Ref Range   WBC 7.5 3.6 - 11.0 K/uL   RBC 4.37 3.80 - 5.20 MIL/uL   Hemoglobin 11.3 (L) 12.0 - 16.0  g/dL   HCT 35.5 35.0 - 47.0 %   MCV 81.1 80.0 - 100.0 fL   MCH 25.8 (L) 26.0 - 34.0 pg   MCHC 31.8 (L) 32.0 - 36.0 g/dL   RDW 16.2 (H) 11.5 - 14.5 %   Platelets 215 150 - 440 K/uL   Neutrophils Relative % 68 %   Neutro Abs 5.1 1.4 - 6.5 K/uL   Lymphocytes Relative 13 %   Lymphs Abs 1.0 1.0 - 3.6 K/uL   Monocytes Relative 10 %   Monocytes Absolute 0.7 0.2 - 0.9 K/uL   Eosinophils Relative 8 %   Eosinophils Absolute 0.6 0 - 0.7 K/uL   Basophils Relative 1 %   Basophils Absolute 0.1 0 - 0.1 K/uL  Magnesium     Status: None   Collection Time: 07/01/15  9:36 PM  Result Value Ref Range   Magnesium 1.7 1.7 - 2.4 mg/dL   Dg Pelvis 1-2 Views  07/01/2015  CLINICAL DATA:  Acute onset of lower extremity cramping and spasms. Initial encounter. EXAM: PELVIS - 1-2 VIEW COMPARISON:  Abdominal radiograph performed 10/11/2014 FINDINGS: There is no evidence of fracture or dislocation. Both femoral heads are seated normally within their respective acetabula. No significant degenerative change is appreciated. The sacroiliac joints are unremarkable in appearance. Lumbar spinal fusion hardware is noted at L4-L5. The visualized bowel gas pattern is grossly unremarkable in appearance. Scattered phleboliths are noted within the pelvis. A large chronic osseous fragment is noted superior to the left femoral neck, possibly within the overlying soft tissues. IMPRESSION: No evidence of fracture or dislocation. Electronically Signed   By: Garald Balding M.D.   On: 07/01/2015 23:23    Review of Systems  Constitutional: Positive for malaise/fatigue. Negative for fever and chills.  HENT: Negative for sore throat and tinnitus.   Eyes: Negative for blurred vision and redness.  Respiratory: Negative for cough and shortness of breath.   Cardiovascular: Negative for chest pain, palpitations, orthopnea and PND.  Gastrointestinal: Negative for nausea, vomiting, abdominal pain and diarrhea.  Genitourinary: Negative for  dysuria, urgency and frequency.  Musculoskeletal: Positive for falls. Negative for myalgias and joint pain.  Skin: Negative for rash.       No lesions  Neurological: Positive for weakness. Negative for speech change and focal weakness.  Endo/Heme/Allergies: Does not bruise/bleed easily.       No temperature intolerance  Psychiatric/Behavioral: Negative for depression and suicidal ideas.    Blood pressure 171/54, pulse 61, temperature 97.6 F (36.4 C), temperature source  Oral, resp. rate 18, height _0  (1.6 m), weight 63.504 kg (140 lb), SpO2 98 %. Physical Exam  Vitals reviewed. Constitutional: She is oriented to person, place, and time. She appears well-developed and well-nourished. No distress.  HENT:  Head: Normocephalic and atraumatic.  Mouth/Throat: Oropharynx is clear and moist.  Eyes: Conjunctivae and EOM are normal. Pupils are equal, round, and reactive to light. No scleral icterus.  Neck: Normal range of motion. Neck supple. No JVD present. No tracheal deviation present. No thyromegaly present.  Cardiovascular: Normal rate, regular rhythm and normal heart sounds.  Exam reveals no gallop and no friction rub.   No murmur heard. Respiratory: Effort normal and breath sounds normal.  GI: Soft. Bowel sounds are normal. She exhibits no distension. There is no tenderness.  Genitourinary:  Deferred  Musculoskeletal: Normal range of motion. She exhibits no edema.  Lymphadenopathy:    She has no cervical adenopathy.  Neurological: She is alert and oriented to person, place, and time. No cranial nerve deficit. She exhibits normal muscle tone.  Skin: Skin is warm and dry.  Psychiatric: She has a normal mood and affect. Her behavior is normal. Judgment and thought content normal.     Assessment/Plan This is a 69 year old Caucasian female admitted for multiple sclerosis exacerbation. 1. Multiple sclerosis: Likely acute exacerbation is the patient's respiratory effort is somewhat  decreased although this may be secondary to extra dosing of muscle relaxants. Negative respiratory force is moderately decreased. Continue supplement oxygen as needed. Lungs are clear. The patient denies pain at this time but legs are still drawn into fetal position and the patient does not want to relax them presumably due to pain. The patient's husband states that in the past steroids have produced delirium and psychosis but he cannot recall if the patient has undergone plasmapheresis or other acute treatments for MS exacerbation. I believe the benefit of Solu-Medrol outweighs  the risk of delirium. Steroid may serve make her more alert and improve her respiratory effort. We will monitor the latter closely. Neurology consult in the morning. Continue MS regimen for now 2. Hypertension: Continue amlodipine, lisinopril and metoprolol 3. DVT prophylaxis: Heparin 4. GI prophylaxis: None The patient is a full code. Time spent on admission was inpatient care approximately 45 minutes   Harrie Foreman 07/02/2015, 12:19 AM

## 2015-07-02 NOTE — Progress Notes (Signed)
NIF performed with Pt. Achieving -25 to -30 cm H20 pressure. Best of 5 attempts. Pt. Somnolent . With minimal effort exerted.

## 2015-07-02 NOTE — Progress Notes (Signed)
Pt on scheduled Insulin, per pt she states "she does not take insulin or have a history of diabetes". MD Mody notified. Sliding scaled insulin d/c. Will continue to monitor.

## 2015-07-02 NOTE — Progress Notes (Addendum)
[facility] [place] at [facility]   PATIENT NAME: Brandi Gibbs    MR#:  [id number]  DATE OF BIRTH:  03-14-1946  SUBJECTIVE:   Patient states that her left knee hurts and she is unable to straighten her leg. She says after her nap she may have landed on it wrong.  REVIEW OF SYSTEMS:    Review of Systems  Constitutional: Positive for malaise/fatigue. Negative for fever, chills and diaphoresis.  HENT: Negative for sore throat.   Eyes: Negative for blurred vision.  Respiratory: Negative for cough, hemoptysis, shortness of breath and wheezing.   Cardiovascular: Negative for chest pain, palpitations and leg swelling.  Gastrointestinal: Negative for nausea, vomiting, abdominal pain, diarrhea and blood in stool.  Genitourinary: Negative for dysuria.  Musculoskeletal: Negative for back pain and joint pain.  Neurological: Positive for weakness. Negative for dizziness, tremors and headaches.  Endo/Heme/Allergies: Does not bruise/bleed easily.    Tolerating Diet: yes      DRUG ALLERGIES:   Allergies  Allergen Reactions  . Other Other (See Comments)    Blood thinners - GI bleed  . Prednisone Other (See Comments)    Causes hair to fall out   . Omeprazole Rash    VITALS:  Blood pressure 132/60, pulse 97, temperature 97.7 F (36.5 C), temperature source Oral, resp. rate 18, height  (1.6 m), weight 66.225 kg (146 lb), SpO2 98 %.  PHYSICAL EXAMINATION:   Physical Exam  Constitutional: She is oriented to person, place, and time and well-developed, well-nourished, and in no distress. No distress.  HENT:  Head: Normocephalic.  Eyes: No scleral icterus.  Neck: Normal range of motion. Neck supple. No JVD present. No tracheal deviation present.  Cardiovascular: Normal rate, regular rhythm and normal heart sounds.  Exam reveals no gallop and no friction rub.   No murmur heard. Pulmonary/Chest: Effort normal and breath sounds normal. No respiratory  distress. She has no wheezes. She has no rales. She exhibits no tenderness.  Abdominal: Soft. Bowel sounds are normal. She exhibits no distension and no mass. There is no tenderness. There is no rebound and no guarding.  Musculoskeletal: Normal range of motion. She exhibits no edema.  Left knee swollen, hot patient is unable to extend her leg.  Neurological: She is alert and oriented to person, place, and time.  Skin: Skin is warm. No rash noted. No erythema.  Psychiatric: Affect and judgment normal.      LABORATORY PANEL:   CBC  Recent Labs Lab 07/01/15 2136  WBC 7.5  HGB 11.3*  HCT 35.5  PLT 215   ------------------------------------------------------------------------------------------------------------------  Chemistries   Recent Labs Lab 07/01/15 2136  NA 140  K 3.7  CL 106  CO2 27  GLUCOSE 164*  BUN 21*  CREATININE 0.80  CALCIUM 9.1  MG 1.7   ------------------------------------------------------------------------------------------------------------------  Cardiac Enzymes No results for input(s): TROPONINI in the last 168 hours. ------------------------------------------------------------------------------------------------------------------  RADIOLOGY:  Dg Pelvis 1-2 Views  07/01/2015  CLINICAL DATA:  Acute onset of lower extremity cramping and spasms. Initial encounter. EXAM: PELVIS - 1-2 VIEW COMPARISON:  Abdominal radiograph performed 10/11/2014 FINDINGS: There is no evidence of fracture or dislocation. Both femoral heads are seated normally within their respective acetabula. No significant degenerative change is appreciated. The sacroiliac joints are unremarkable in appearance. Lumbar spinal fusion hardware is noted at L4-L5. The visualized bowel gas pattern is grossly unremarkable in appearance. Scattered phleboliths are noted within the pelvis. A large chronic osseous fragment is noted superior  to the left femoral neck, possibly within the overlying soft  tissues. IMPRESSION: No evidence of fracture or dislocation. Electronically Signed   By: Roanna Raider M.D.   On: 07/01/2015 23:23     ASSESSMENT AND PLAN:    69 year old female with a history of multiple sclerosis who is complaining of left knee pain.    1. Left knee pain: Patient's knee is swollen and very tender and hot. I will order MRI to evaluate this. I will also orthopedic consultation.   2.. Multiple sclerosis: This does not appear to be an MS flare.  Her weakness was probably from her knee pain.   3. Accelerated hypertension: Patient is on Norvasc, lisinopril and metoprolol. Her blood pressure still elevated. Hydralazine when necessary has been ordered. I suspect some of her increased blood pressure is do to pain.  4. Hyperlipidemia: Continue Zocor  5. Hyperthyroidism: Patient recently had an increase in METHIMAZOLE. Endocrinology consultation has been placed. TSH is low.  Management plans discussed with the patient and she is in agreement.  CODE STATUS: FULL  TOTAL TIME TAKING CARE OF THIS PATIENT: 30 minutes.    D/w husband at bedside POSSIBLE D/C 2-4 days, DEPENDING ON CLINICAL CONDITION.   Maizie Garno M.D on 07/02/2015 at 11:21 AM  Between 7am to 6pm - Pager - [phone] After 6pm go to [email or web address] - password [email or web address] [facility]  [place] [facility]  Office  [phone]  CC: Primary care physician; Dorothey Baseman, MD  Note: This dictation was prepared with Dragon dictation along with smaller phrase technology. Any transcriptional errors that result from this process are unintentional.

## 2015-07-02 NOTE — Progress Notes (Signed)
Pts BP 178/60 MD Mody, per MD give PRN Hydralazine. Will continue to monitor.

## 2015-07-02 NOTE — Consult Note (Signed)
Endocrine Initial Consult Note Date of Consult: 07/02/2015  Consulting Service: [facility] Endocrinology  Service Requesting Consult: Dr. Juliene Pina  SUBJECTIVE: Reason for Consultation: hyperthyroidism  History of Present Illness: Brandi Gibbs is a 69 y.o. female with PMH Multiple Sclerosis, Movement disorder, RLS, HTN, and Hyperthyroidism admitted with lower extremity pain, weakness, and fatigue. Endocrinology has been consulted regarding hyperthyroidism. She is known to me from the outpatient setting and was seen in clinic just last week. She is on a regimen of methimazole 10 mg daily. She reports complete adherence with the medication and denies any intolerance. She denies palpitations, sweats, weight loss, frequent bowel movements, or worsening tremors from baseline.    Patient Active Problem List   Diagnosis Date Noted  . Right shoulder pain 12/24/2014  . Arthralgia of shoulder 12/24/2014  . Altered mental state 12/06/2014  . Abnormal mental state 12/06/2014  . Ataxic gait 09/11/2014  . Cognitive changes 09/11/2014  . Other fatigue 09/11/2014  . Urinary hesitancy 09/11/2014  . Spasticity 09/11/2014  . Midline low back pain without sciatica 09/11/2014  . Insomnia 09/11/2014  . Restless leg 09/11/2014  . Snoring 09/11/2014  . Abnormal involuntary movement 09/11/2014  . Abnormal gait 09/11/2014  . Abnormal respiratory rate 09/11/2014  . Other symptoms and signs involving cognitive functions and awareness 09/11/2014  . LBP (low back pain) 09/11/2014  . Does not feel right 09/11/2014  . DS (disseminated sclerosis) (H[facility]) 03/28/2014  . Osteopenia 03/28/2014  . Hyperthyroidism 03/28/2014  . BP (high blood pressure) 03/28/2014  . HLD (hyperlipidemia) 03/28/2014  . Essential (primary) hypertension 03/28/2014  . 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 Multiple sclerosis ([facility]) 03/28/2014     Past Medical History  Diagnosis Date  . Multiple sclerosis  ([facility])   . Movement disorder   . Hypertension   . Vision abnormalities   . Restless leg syndrome   . Allergy   . Anxiety   . Arthritis   . GERD (gastroesophageal reflux disease)   . Neuromuscular disorder ([facility])   . Seizures ([facility])   . Thyroid disease    Past Surgical History  Procedure Laterality Date  . Partial hysterectomy    . Back surgery     Family History  Problem Relation Age of Onset  . Leukemia Mother   . Healthy Father     Social History:  Social History  Substance Use Topics  . Smoking status: Former Smoker    Types: Cigarettes  . Smokeless tobacco: Not on file  . Alcohol Use: No     Comment: rare/fim     Allergies  Allergen Reactions  . Other Other (See Comments)    Blood thinners - GI bleed  . Prednisone Other (See Comments)    Causes hair to fall out   . Omeprazole Rash     Medications:  No current facility-administered medications on file prior to encounter.   Current Outpatient Prescriptions on File Prior to Encounter  Medication Sig Dispense Refill  . baclofen (LIORESAL) 10 MG tablet Take 1 tablet (10 mg total) by mouth 3 (three) times daily. 90 each 11  . diltiazem (DILACOR XR) 180 MG 24 hr capsule Take 180 mg by mouth daily.    Marland Kitchen lisinopril (PRINIVIL,ZESTRIL) 40 MG tablet Take 40 mg by mouth daily.     . metoprolol tartrate (LOPRESSOR) 25 MG tablet Take 25 mg by mouth 2 (two) times daily.    . pramipexole (MIRAPEX) 0.5 MG tablet Take 1 tablet (0.5 mg total) by  mouth at bedtime. 30 tablet 11  . simvastatin (ZOCOR) 40 MG tablet Take 40 mg by mouth daily.    . Teriflunomide 14 MG TABS Take 14 mg by mouth daily. 90 tablet 3  . oxybutynin (DITROPAN) 5 MG tablet Take 1 tablet (5 mg total) by mouth 3 (three) times daily. (Patient not taking: Reported on 07/01/2015) 90 tablet 11   Methimazole 10 mg once daily  Review of Systems: Pertinent items noted in HPI and remainder of comprehensive ROS otherwise negative.  OBJECTIVE: Temp:  [97.6 F (36.4  C)-97.7 F (36.5 C)] 97.7 F (36.5 C) (12/07 0740) Pulse Rate:  [59-102] 74 (12/07 1143) Resp:  [16-29] 18 (12/07 0518) BP: (132-217)/(49-97) 140/62 mmHg (12/07 1143) SpO2:  [90 %-100 %] 95 % (12/07 1407) Weight:  [63.504 kg (140 lb)-66.225 kg (146 lb)] 66.225 kg (146 lb) (12/07 0530)  Temp (24hrs), Avg:97.6 F (36.4 C), Min:97.6 F (36.4 C), Max:97.7 F (36.5 C)  Weight: 66.225 kg (146 lb)  Physical Exam: Gen: NAD, chronically ill-appearing woman, sitting in bed in NAD Neuro: AAOx3, normal concentration, speech at baseline Heent: normocephalic, atraumatic, mucous membranes moist, no exophthalmos. No scleral or conjunctival injection. Neck: Thyroid: moves well with swallowing, normal size, no masses appreciated Heme/lymph: no cervical, supraclavicular or submandibular lymphadenopathy is appreciated. Pulmonary: clear to auscultation bilaterally, no wheezes/rhonchi or rales Cardiovascular: regular rate and rhythm, 2/6 systolic ejection murmur GI: soft non tender, nondistended, normal bowel sounds Extremities: no edema.  Skin: normal texture, warm Psyche: normal affect with good insight into their medical conditions  BMP Latest Ref Rng 07/01/2015 12/10/2014 12/06/2014  Glucose 65 - 99 mg/dL 729(M) 211(D) -  BUN 6 - 20 mg/dL 55(M) 08(Y) -  Creatinine 0.44 - 1.00 mg/dL 2.23 3.61 2.24  Sodium 135 - 145 mmol/L 140 139 -  Potassium 3.5 - 5.1 mmol/L 3.7 3.3(L) -  Chloride 101 - 111 mmol/L 106 101 -  CO2 22 - 32 mmol/L 27 26 -  Calcium 8.9 - 10.3 mg/dL 9.1 9.4 -   CBC Latest Ref Rng 07/01/2015 12/10/2014 12/06/2014  WBC 3.6 - 11.0 K/uL 7.5 10.7 7.9  Hemoglobin 12.0 - 16.0 g/dL 11.3(L) 10.6(L) 11.5(L)  Hematocrit 35.0 - 47.0 % 35.5 33.1(L) 35.6  Platelets 150 - 440 K/uL 215 300 299   Component     Latest Ref Rng 07/02/2015  TSH     0.350 - 4.500 uIU/mL 0.041 (L)    ASSESSMENT:  69 yo woman with PMH Multiple Sclerosis and 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 She was  admitted with lower extremity weakness/pain and fatigue. Endocrinology consulted regarding management of hyperthyroidism.  RECOMMENDATIONS:  Resume home methimazole 10 mg once daily. Check free T4 and free T3 levels. Will adjust methimazole dosing if indicated based on results of above. She has follow up scheduled with me 08/18/2015. Thank you for this consult.  Will follow along.   Doylene Canning, MD [facility] Endocrinology

## 2015-07-02 NOTE — ED Notes (Signed)
Spoke with Dr. Clint Guy about pt elevated blood pressure of 217/97, med order for hydralazine given to help decrease pt blood pressure.

## 2015-07-02 NOTE — ED Notes (Signed)
Respiratory at bedside.

## 2015-07-02 NOTE — Care Management Obs Status (Signed)
MEDICARE OBSERVATION STATUS NOTIFICATION   Patient Details  Name: Brandi Gibbs MRN: [id number] Date of Birth: [birth year]   Medicare Observation Status Notification Given:  Yes    Collie Siad, RN 07/02/2015, 10:56 AM

## 2015-07-03 ENCOUNTER — Telehealth: Payer: Self-pay | Admitting: Neurology

## 2015-07-03 DIAGNOSIS — G35 Multiple sclerosis: Secondary | ICD-10-CM | POA: Diagnosis not present

## 2015-07-03 LAB — BASIC METABOLIC PANEL
ANION GAP: 5 (ref 5–15)
BUN: 24 mg/dL — ABNORMAL HIGH (ref 6–20)
CALCIUM: 8.7 mg/dL — AB (ref 8.9–10.3)
CO2: 24 mmol/L (ref 22–32)
Chloride: 114 mmol/L — ABNORMAL HIGH (ref 101–111)
Creatinine, Ser: 0.53 mg/dL (ref 0.44–1.00)
GLUCOSE: 129 mg/dL — AB (ref 65–99)
Potassium: 4.2 mmol/L (ref 3.5–5.1)
SODIUM: 143 mmol/L (ref 135–145)

## 2015-07-03 LAB — CBC
HCT: 32.8 % — ABNORMAL LOW (ref 35.0–47.0)
Hemoglobin: 10.4 g/dL — ABNORMAL LOW (ref 12.0–16.0)
MCH: 25.8 pg — ABNORMAL LOW (ref 26.0–34.0)
MCHC: 31.6 g/dL — ABNORMAL LOW (ref 32.0–36.0)
MCV: 81.6 fL (ref 80.0–100.0)
PLATELETS: 197 10*3/uL (ref 150–440)
RBC: 4.02 MIL/uL (ref 3.80–5.20)
RDW: 17 % — AB (ref 11.5–14.5)
WBC: 11.2 10*3/uL — AB (ref 3.6–11.0)

## 2015-07-03 LAB — T3, FREE: T3 FREE: 3.9 pg/mL (ref 2.0–4.4)

## 2015-07-03 LAB — GLUCOSE, CAPILLARY
Glucose-Capillary: 104 mg/dL — ABNORMAL HIGH (ref 65–99)
Glucose-Capillary: 125 mg/dL — ABNORMAL HIGH (ref 65–99)

## 2015-07-03 MED ORDER — PREDNISONE 10 MG PO TABS: 60.0000 mg | ORAL_TABLET | Freq: Every day | ORAL | Status: DC

## 2015-07-03 MED ORDER — OXYCODONE-ACETAMINOPHEN 5-325 MG PO TABS
1.0000 | ORAL_TABLET | ORAL | Status: DC | PRN
  Administered 2015-07-03: 1 via ORAL
  Filled 2015-07-03: qty 1

## 2015-07-03 MED ORDER — METHIMAZOLE 10 MG PO TABS: 10.0000 mg | ORAL_TABLET | Freq: Every day | ORAL | Status: AC

## 2015-07-03 MED ORDER — OXYCODONE-ACETAMINOPHEN 5-325 MG PO TABS: 1.0000 | ORAL_TABLET | ORAL | Status: DC | PRN

## 2015-07-03 NOTE — Progress Notes (Signed)
Clinical Social Worker (CSW) met with patient and her husband Ranya Fiddler was at bedside. CSW discussed long term care placement for a second time with patient and husband was present. Patient continues to refuse SNF. Husband asked if patient can go to [facility] or [facility] CSW explained to husband that patient is under Medicare Observation so Medicare will not pay for short term rehab. CSW also explained that a search for a Medicaid bed can be started but patient will likely have to go outside of [facility]. CSW also explained home health option. Patient and husband reported that they want home health and are discharging home today. Husband requested CSW to send SNF referral and they will follow up if they change their mind about placement.   RN Case Manager arranged home health through [facility]. CSW contacted Theodis Sato with [facility] and made her aware of above. CSW prepared EMS in case patient needs transport home. Husband confirmed patient's address. Please reconsult if future social work needs arise. CSW signing off.   Blima Rich, [place] [phone]

## 2015-07-03 NOTE — [place] FL2 (Signed)
[place] MEDICAID FL2 LEVEL OF CARE SCREENING TOOL     IDENTIFICATION  Patient Name: Brandi Gibbs Birthdate: 08/30/1945 Sex: female Admission Date (Current Location): 07/01/2015  [place] and [place] Number:  [facility] )  ([id number]) Facility and Address:  [facility], [address] [place], [place] [address]      Provider Number: [id number]  Attending Physician Name and Address:  Adrian Saran, MD  Relative Name and Phone Number:       Current Level of Care: Hospital Recommended Level of Care: Skilled Nursing Facility Prior Approval Number:    Date Approved/Denied:   PASRR Number:  ( [id number] )  Discharge Plan: SNF    Current Diagnoses: Patient Active Problem List   Diagnosis Date Noted  . Right shoulder pain 12/24/2014  . Arthralgia of shoulder 12/24/2014  . Altered mental state 12/06/2014  . Abnormal mental state 12/06/2014  . Ataxic gait 09/11/2014  . Cognitive changes 09/11/2014  . Other fatigue 09/11/2014  . Urinary hesitancy 09/11/2014  . Spasticity 09/11/2014  . Midline low back pain without sciatica 09/11/2014  . Insomnia 09/11/2014  . Restless leg 09/11/2014  . Snoring 09/11/2014  . Abnormal involuntary movement 09/11/2014  . Abnormal gait 09/11/2014  . Abnormal respiratory rate 09/11/2014  . Other symptoms and signs involving cognitive functions and awareness 09/11/2014  . LBP (low back pain) 09/11/2014  . Does not feel right 09/11/2014  . DS (disseminated sclerosis) (HCC) 03/28/2014  . Osteopenia 03/28/2014  . Hyperthyroidism 03/28/2014  . BP (high blood pressure) 03/28/2014  . HLD (hyperlipidemia) 03/28/2014  . Essential (primary) hypertension 03/28/2014  . 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 Multiple sclerosis (HCC) 03/28/2014    Orientation ACTIVITIES/SOCIAL BLADDER RESPIRATION    Self, Time, Situation, Place  Active Continent Normal  BEHAVIORAL SYMPTOMS/MOOD NEUROLOGICAL  BOWEL NUTRITION STATUS   (none )  (none ) Continent Diet (Diet: Carb Modified )  PHYSICIAN VISITS COMMUNICATION OF NEEDS Height & Weight Skin  30 days Verbally 5\' 3"  (160 cm) 155 lbs. Normal          AMBULATORY STATUS RESPIRATION    Assist extensive Normal      Personal Care Assistance Level of Assistance  Bathing, Feeding, Dressing, Total care Bathing Assistance: Limited assistance Feeding assistance: Limited assistance Dressing Assistance: Maximum assistance Total Care Assistance: Maximum assistance    Functional Limitations Info  Sight, Hearing, Speech Sight Info: Adequate Hearing Info: Adequate Speech Info: Adequate       SPECIAL CARE FACTORS FREQUENCY  PT (By licensed PT)     PT Frequency:  (5)             Additional Factors Info  Code Status, Allergies Code Status Info:  (Full Code. ) Allergies Info:  (Prednisone, Omeprazole)           Current Medications (07/03/2015):  This is the current hospital active medication list Current Facility-Administered Medications  Medication Dose Route Frequency Provider Last Rate Last Dose  . 0.9 % NaCl with KCl 40 mEq / L  infusion   Intravenous Continuous Arnaldo Natal, MD 100 mL/hr at 07/03/15 0416 100 mL/hr at 07/03/15 0416  . acetaminophen (TYLENOL) tablet 650 mg  650 mg Oral Q6H PRN Arnaldo Natal, MD       Or  . acetaminophen (TYLENOL) suppository 650 mg  650 mg Rectal Q6H PRN Arnaldo Natal, MD      . baclofen (LIORESAL) tablet 10 mg  10 mg Oral TID Kelton Pillar  Sheryle Hail, MD   10 mg at 07/03/15 0911  . diltiazem (CARDIZEM CD) 24 hr capsule 240 mg  240 mg Oral Daily Adrian Saran, MD   240 mg at 07/03/15 0911  . docusate sodium (COLACE) capsule 100 mg  100 mg Oral BID Arnaldo Natal, MD   100 mg at 07/02/15 2115  . heparin injection 5,000 Units  5,000 Units Subcutaneous 3 times per day Arnaldo Natal, MD   5,000 Units at 07/03/15 0515  . hydrALAZINE (APRESOLINE) injection 10 mg  10 mg Intravenous Q4H PRN  Wyatt Haste, MD   10 mg at 07/02/15 0827  . lisinopril (PRINIVIL,ZESTRIL) tablet 40 mg  40 mg Oral Daily Arnaldo Natal, MD   40 mg at 07/03/15 0813  . methimazole (TAPAZOLE) tablet 10 mg  10 mg Oral Daily Abby Lanetta Inch, MD   10 mg at 07/03/15 0911  . metoprolol tartrate (LOPRESSOR) tablet 25 mg  25 mg Oral BID Arnaldo Natal, MD   25 mg at 07/03/15 1610  . morphine 2 MG/ML injection 2 mg  2 mg Intravenous Q4H PRN Arnaldo Natal, MD      . nitroGLYCERIN (NITROGLYN) 2 % ointment 0.5 inch  0.5 inch Topical 4 times per day Arnaldo Natal, MD   0.5 inch at 07/03/15 0516  . ondansetron (ZOFRAN) tablet 4 mg  4 mg Oral Q6H PRN Arnaldo Natal, MD       Or  . ondansetron [facility]) injection 4 mg  4 mg Intravenous Q6H PRN Arnaldo Natal, MD      . oxyCODONE-acetaminophen (PERCOCET/ROXICET) 5-325 MG per tablet 1 tablet  1 tablet Oral Q4H PRN Adrian Saran, MD      . pramipexole (MIRAPEX) tablet 0.5 mg  0.5 mg Oral QHS Arnaldo Natal, MD   0.5 mg at 07/02/15 2116  . predniSONE (DELTASONE) tablet 60 mg  60 mg Oral Q breakfast Adrian Saran, MD   60 mg at 07/03/15 0756  . simvastatin (ZOCOR) tablet 40 mg  40 mg Oral Daily Arnaldo Natal, MD   40 mg at 07/03/15 0911  . sodium chloride 0.9 % injection 3 mL  3 mL Intravenous Q12H Arnaldo Natal, MD   3 mL at 07/02/15 0932  . Teriflunomide TABS 14 mg  14 mg Oral Daily Arnaldo Natal, MD   14 mg at 07/03/15 9604     Discharge Medications: Please see discharge summary for a list of discharge medications.  Relevant Imaging Results:  Relevant Lab Results:  Recent Labs    Additional Information  (SSN: [id number])  Haig Prophet, LCSW

## 2015-07-03 NOTE — Progress Notes (Signed)
Physical Therapy Treatment Patient Details Name: Brandi Gibbs MRN: [id number] DOB: 1945-12-29 Today's Date: 07/03/2015    History of Present Illness Pt is 69yo white female with PMH of MS x 40years. Pt recently began to feel lethargic at home and sustained a fall. Pt now at [facility] being treated with MS exacerbation. Pt c/o L knee/hip pain 10/10 with movement, 0/10 at rest. CT was negative and pt is now pending MRI for further evaluation.     PT Comments    Pt is now s/p MRI and has been cleared to work with therapy per ortho note. Pt much more willing to participate this session and demonstrates good technique for OOB mobility, though still limited by pain. Pt requires constant assist for all mobility due to decreased balance/increased risk for falls. Pt complains of L hip pain the most this date, being 10/10 pain with movement. RN with meds in room. Limited there-ex performed.  Follow Up Recommendations  SNF     Equipment Recommendations  None recommended by PT    Recommendations for Other Services       Precautions / Restrictions Precautions Precautions: None Restrictions Weight Bearing Restrictions: No    Mobility  Bed Mobility Overal bed mobility: Needs Assistance Bed Mobility: Supine to Sit     Supine to sit: Min assist     General bed mobility comments: bed mobility performed with assist for trunk support. Once seated at EOB, pt able to sit with supervision  Transfers Overall transfer level: Needs assistance Equipment used: Rolling walker (2 wheeled) Transfers: Sit to/from Stand Sit to Stand: Min assist         General transfer comment: sit<>Stand with rw and safe technique. Cues to push from bed surface. Pt with forward flexed posture once standing  Ambulation/Gait Ambulation/Gait assistance: Min assist Ambulation Distance (Feet): 3 Feet Assistive device: Rolling walker (2 wheeled) Gait Pattern/deviations: Step-to pattern     General Gait Details:  ambulated to recliner with cues for sequencing and therapist assisting movement of rw. Short step to gait pattern performed. Forward flexed posture performed   Stairs            Wheelchair Mobility    Modified Rankin (Stroke Patients Only)       Balance                                    Cognition Arousal/Alertness: Awake/alert Behavior During Therapy: WFL for tasks assessed/performed Overall Cognitive Status: Within Functional Limits for tasks assessed                      Exercises Other Exercises Other Exercises: Pt able to perform B LE ther-ex including SLRs, knee flexion x 10 reps, however all other movement caused 10/10 pain. Ther-ex performed with supervision    General Comments        Pertinent Vitals/Pain Pain Assessment: 0-10 Pain Score: 10-Worst pain ever Pain Location: L hip Pain Descriptors / Indicators: Aching;Operative site guarding Pain Intervention(s): Limited activity within patient's tolerance;Patient requesting pain meds-RN notified;RN gave pain meds during session    Home Living                      Prior Function            PT Goals (current goals can now be found in the care plan section) Acute Rehab PT Goals Patient Stated  Goal: Return to home, decrease pain, regain energy PT Goal Formulation: With patient Time For Goal Achievement: 07/16/15 Potential to Achieve Goals: Fair Progress towards PT goals: Progressing toward goals    Frequency  Min 2X/week    PT Plan Current plan remains appropriate    Co-evaluation             End of Session Equipment Utilized During Treatment: Gait belt Activity Tolerance: Patient limited by pain Patient left: in chair;with chair alarm set     Time: 1610-9604 PT Time Calculation (min) (ACUTE ONLY): 21 min  Charges:  $Gait Training: 8-22 mins                    G Codes:      Ogechi Kuehnel July 07, 2015, 11:09 AM Elizabeth Palau, PT, DPT [phone]

## 2015-07-03 NOTE — Discharge Summary (Signed)
[facility] [place] [facility]   PATIENT NAME: Brandi Gibbs    MR#:  [id number]  DATE OF BIRTH:  Feb 05, 1946  DATE OF ADMISSION:  07/01/2015 ADMITTING PHYSICIAN: Arnaldo Natal, MD  DATE OF DISCHARGE: 07/03/2015 PRIMARY CARE PHYSICIAN: Dorothey Baseman, MD    ADMISSION DIAGNOSIS:  Multiple sclerosis exacerbation (HCC) [G35] Muscle spasm of both lower legs [M62.838]  DISCHARGE DIAGNOSIS:  Active Problems:   Multiple sclerosis (HCC) Arthritis of left knee  SECONDARY DIAGNOSIS:   Past Medical History  Diagnosis Date  . Multiple sclerosis (HCC)   . Movement disorder   . Hypertension   . Vision abnormalities   . Restless leg syndrome   . Allergy   . Anxiety   . Arthritis   . GERD (gastroesophageal reflux disease)   . Neuromuscular disorder (HCC)   . Seizures (HCC)   . Thyroid disease     HOSPITAL COURSE:   69 year old female with a history of multiple sclerosis who is complaining of left knee pain.   1. Left knee pain: Patient had an MRI which showed try compartmental osteophyte arthritis changes. Her left knee pain was secondary to severe arthritis. Steroid seemed to help as well as pain medications. Patient does not want to go to skilled nursing facility. Patient will be discharged at home with home health. Orthopedic surgery was consulted and consult was appreciated.   2.. Multiple sclerosis: Patient did not have an MS flare during this hospitalization. 3. Accelerated hypertension: Patient is on Norvasc, lisinopril and metoprolol.  4. Hyperlipidemia: Continue Zocor  5. Hyperthyroidism: Patient was seen and evaluated by endocrinology while in the hospital. Patient will continue on 10 mg of methimazole. Patient will have follow-up in a few months with endocrinology. Onset was appreciated.  DISCHARGE CONDITIONS AND DIET:   Patient is stable to be discharged home with home health on a regular diet  CONSULTS OBTAINED:  Treatment  Team:  Abby Lanetta Inch, MD Juanell Fairly, MD Mellody Drown, MD  DRUG ALLERGIES:   Allergies  Allergen Reactions  . Other Other (See Comments)    Blood thinners - GI bleed  . Prednisone Other (See Comments)    Causes hair to fall out   . Omeprazole Rash    DISCHARGE MEDICATIONS:   Current Discharge Medication List    START taking these medications   Details  methimazole (TAPAZOLE) 10 MG tablet Take 1 tablet (10 mg total) by mouth daily. Qty: 30 tablet, Refills: 0    oxyCODONE-acetaminophen (PERCOCET/ROXICET) 5-325 MG tablet Take 1 tablet by mouth every 4 (four) hours as needed for moderate pain. Qty: 30 tablet, Refills: 0    predniSONE (DELTASONE) 10 MG tablet Take 6 tablets (60 mg total) by mouth daily with breakfast. Qty: 30 tablet, Refills: 0      CONTINUE these medications which have NOT CHANGED   Details  baclofen (LIORESAL) 10 MG tablet Take 1 tablet (10 mg total) by mouth 3 (three) times daily. Qty: 90 each, Refills: 11    diltiazem (DILACOR XR) 180 MG 24 hr capsule Take 180 mg by mouth daily.    lisinopril (PRINIVIL,ZESTRIL) 40 MG tablet Take 40 mg by mouth daily.     metoprolol tartrate (LOPRESSOR) 25 MG tablet Take 25 mg by mouth 2 (two) times daily.    pramipexole (MIRAPEX) 0.5 MG tablet Take 1 tablet (0.5 mg total) by mouth at bedtime. Qty: 30 tablet, Refills: 11    simvastatin (ZOCOR) 40 MG tablet Take 40 mg by  mouth daily.    Teriflunomide 14 MG TABS Take 14 mg by mouth daily. Qty: 90 tablet, Refills: 3      STOP taking these medications     oxybutynin (DITROPAN) 5 MG tablet               Today   CHIEF COMPLAINT:  Patient is doing fairly well this morning. Patient has some movement of her left leg however does have some pain.   VITAL SIGNS:  Blood pressure 173/61, pulse 73, temperature 97.7 F (36.5 C), temperature source Oral, resp. rate 17, height 5\' 3"  (1.6 m), weight 70.308 kg (155 lb), SpO2 94 %.   REVIEW OF  SYSTEMS:  Review of Systems  Constitutional: Negative for fever, chills and malaise/fatigue.  HENT: Negative for sore throat.   Eyes: Negative for blurred vision.  Respiratory: Negative for cough, hemoptysis, shortness of breath and wheezing.   Cardiovascular: Negative for chest pain, palpitations and leg swelling.  Gastrointestinal: Negative for nausea, vomiting, abdominal pain, diarrhea and blood in stool.  Genitourinary: Negative for dysuria.  Musculoskeletal: Positive for joint pain. Negative for back pain.  Neurological: Negative for dizziness, tremors and headaches.  Endo/Heme/Allergies: Does not bruise/bleed easily.     PHYSICAL EXAMINATION:  GENERAL:  69 y.o.-year-old patient lying in the bed with no acute distress.  NECK:  Supple, no jugular venous distention. No thyroid enlargement, no tenderness.  LUNGS: Normal breath sounds bilaterally, no wheezing, rales,rhonchi  No use of accessory muscles of respiration.  CARDIOVASCULAR: S1, S2 normal. No murmurs, rubs, or gallops.  ABDOMEN: Soft, non-tender, non-distended. Bowel sounds present. No organomegaly or mass.  EXTREMITIES: Left knee with arthritic changes swelling has decreased. Range of motion is slightly improved since yesterday. No edema noted  PSYCHIATRIC: The patient is alert and oriented x 3.  SKIN: No obvious rash, lesion, or ulcer.   DATA REVIEW:   CBC  Recent Labs Lab 07/03/15 0445  WBC 11.2*  HGB 10.4*  HCT 32.8*  PLT 197    Chemistries   Recent Labs Lab 07/01/15 2136 07/03/15 0445  NA 140 143  K 3.7 4.2  CL 106 114*  CO2 27 24  GLUCOSE 164* 129*  BUN 21* 24*  CREATININE 0.80 0.53  CALCIUM 9.1 8.7*  MG 1.7  --     Cardiac Enzymes No results for input(s): TROPONINI in the last 168 hours.  Microbiology Results  @[email or web address] @  RADIOLOGY:  Dg Pelvis 1-2 Views  07/01/2015  CLINICAL DATA:  Acute onset of lower extremity cramping and spasms. Initial encounter. EXAM: PELVIS - 1-2 VIEW  COMPARISON:  Abdominal radiograph performed 10/11/2014 FINDINGS: There is no evidence of fracture or dislocation. Both femoral heads are seated normally within their respective acetabula. No significant degenerative change is appreciated. The sacroiliac joints are unremarkable in appearance. Lumbar spinal fusion hardware is noted at L4-L5. The visualized bowel gas pattern is grossly unremarkable in appearance. Scattered phleboliths are noted within the pelvis. A large chronic osseous fragment is noted superior to the left femoral neck, possibly within the overlying soft tissues. IMPRESSION: No evidence of fracture or dislocation. Electronically Signed   By: Roanna Raider M.D.   On: 07/01/2015 23:23   Mr Knee Left  Wo Contrast  07/02/2015  CLINICAL DATA:  69 year old with anterior knee pain and limited range of motion for 1 day. No acute injury or prior relevant surgery. Initial encounter. EXAM: MRI OF THE LEFT KNEE WITHOUT CONTRAST TECHNIQUE: Multiplanar, multisequence MR imaging of the knee was performed.  No intravenous contrast was administered. COMPARISON:  None. FINDINGS: MENISCI Medial meniscus: Extensive free edge degenerative tearing of the meniscal body and posterior horn. There is probable ossification peripherally in the posterior horn. The meniscal body is largely extruded from the joint. No centrally displaced meniscal fragment identified. Lateral meniscus:  Intact with normal morphology. LIGAMENTS Cruciates:  Intact. Collaterals: Intact. There is MCL degeneration and medial buckling related to the meniscal extrusion. CARTILAGE Patellofemoral: Relatively mild patellar chondral thinning and surface irregularity with prominent osteophytes. Medial: Advanced osteoarthritis with diffuse chondral thinning, subchondral eburnation and large osteophytes. Lateral: Mild chondral thinning with prominent osteophyte formation. OTHER Joint: Moderate-sized joint effusion. There are multiple intra-articular loose  bodies. Loose body in the suprapatellar recess measures up to 1.8 cm. There is a large loose body posteriorly, measuring up to 3.1 cm. This demonstrates heterogeneous T1 and T2 signal. Popliteal Fossa: Large posterior loose bodies as described above. No significant Baker's cyst. Extensor Mechanism:  Intact. Bones:  No acute or significant extra-articular osseous findings. IMPRESSION: 1. 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 2. Multiple intra-articular loose bodies, largest posteriorly. These likely contribute to the patient's limited range of motion. 3. Diffuse degenerative tearing of the posterior horn and body of the medial meniscus with associated meniscal ossification. 4. The lateral meniscus, cruciate and collateral ligaments are intact. Electronically Signed   By: Carey Bullocks M.D.   On: 07/02/2015 16:31   Dg Knee Left Port  07/02/2015  CLINICAL DATA:  Left knee pain and decreased range of motion beginning yesterday. No known injury. EXAM: PORTABLE LEFT KNEE - 1-2 VIEW COMPARISON:  None. FINDINGS: No evidence of fracture or dislocation. Severe tricompartmental osteoarthritis is seen. Small knee joint effusion is seen with ossified joint body in the suprapatellar bursa. IMPRESSION: Severe tricompartmental osteoarthritis. Small knee joint effusion with ossified joint body. Electronically Signed   By: Myles Rosenthal M.D.   On: 07/02/2015 19:12      Management plans discussed with the patient and she is in agreement. Stable for discharge home  Patient should follow up with PCP in 1week ortho in 3 weeks  CODE STATUS:     Code Status Orders        Start     Ordered   07/02/15 0501  Full code   Continuous     07/02/15 0500    Advance Directive Documentation        Most Recent Value   Type of Advance Directive  Healthcare Power of Attorney   Pre-existing out of facility DNR order (yellow form or pink MOST form)     "MOST" Form in Place?        TOTAL TIME  TAKING CARE OF THIS PATIENT: 35 minutes.    Note: This dictation was prepared with Dragon dictation along with smaller phrase technology. Any transcriptional errors that result from this process are unintentional.  Addam Goeller M.D on 07/03/2015 at 11:08 AM  Between 7am to 6pm - Pager - [phone] After 6pm go to [email or web address] - password [email or web address] [facility]  [facility]  Office  [phone]  CC: Primary care physician; Dorothey Baseman, MD

## 2015-07-03 NOTE — Consult Note (Signed)
Reason for Consult: multiple sclerosis Referring Physician: Dr. Roosevelt Locks is an 69 y.o. female.  HPI:  69 yo RHD F presents to [facility] due to L leg pain and weakness.  Pt has long standing history of multiple sclerosis in which she has outside physician and is on immunomodulations.  Pt denies numbness and tingling.  Pt does report pain and weakness in L leg with decreased ROM.  Ortho has MRI'd knee and believes that this is arthritis related.  Past Medical History  Diagnosis Date  . Multiple sclerosis ([place])   . Movement disorder   . Hypertension   . Vision abnormalities   . Restless leg syndrome   . Allergy   . Anxiety   . Arthritis   . GERD (gastroesophageal reflux disease)   . Neuromuscular disorder ([place])   . Seizures ([place])   . Thyroid disease     Past Surgical History  Procedure Laterality Date  . Partial hysterectomy    . Back surgery      Family History  Problem Relation Age of Onset  . Leukemia Mother   . Healthy Father     Social History:  reports that she has quit smoking. Her smoking use included Cigarettes. She does not have any smokeless tobacco history on file. She reports that she does not drink alcohol or use illicit drugs.  Allergies:  Allergies  Allergen Reactions  . Other Other (See Comments)    Blood thinners - GI bleed  . Prednisone Other (See Comments)    Causes hair to fall out   . Omeprazole Rash    Medications:  Personally reviewed by me  Results for orders placed or performed during the hospital encounter of 07/01/15 (from the past 48 hour(s))  Basic metabolic panel     Status: Abnormal   Collection Time: 07/01/15  9:36 PM  Result Value Ref Range   Sodium 140 135 - 145 mmol/L   Potassium 3.7 3.5 - 5.1 mmol/L   Chloride 106 101 - 111 mmol/L   CO2 27 22 - 32 mmol/L   Glucose, Bld 164 (H) 65 - 99 mg/dL   BUN 21 (H) 6 - 20 mg/dL   Creatinine, Ser 0.80 0.44 - 1.00 mg/dL   Calcium 9.1 8.9 - 10.3 mg/dL   GFR calc non Af Amer >60 >60  mL/min   GFR calc Af Amer >60 >60 mL/min    Comment: (NOTE) The eGFR has been calculated using the CKD EPI equation. This calculation has not been validated in all clinical situations. eGFR's persistently <60 mL/min signify possible Chronic Kidney Disease.    Anion gap 7 5 - 15  CBC with Differential     Status: Abnormal   Collection Time: 07/01/15  9:36 PM  Result Value Ref Range   WBC 7.5 3.6 - 11.0 K/uL   RBC 4.37 3.80 - 5.20 MIL/uL   Hemoglobin 11.3 (L) 12.0 - 16.0 g/dL   HCT 35.5 35.0 - 47.0 %   MCV 81.1 80.0 - 100.0 fL   MCH 25.8 (L) 26.0 - 34.0 pg   MCHC 31.8 (L) 32.0 - 36.0 g/dL   RDW 16.2 (H) 11.5 - 14.5 %   Platelets 215 150 - 440 K/uL   Neutrophils Relative % 68 %   Neutro Abs 5.1 1.4 - 6.5 K/uL   Lymphocytes Relative 13 %   Lymphs Abs 1.0 1.0 - 3.6 K/uL   Monocytes Relative 10 %   Monocytes Absolute 0.7 0.2 - 0.9  K/uL   Eosinophils Relative 8 %   Eosinophils Absolute 0.6 0 - 0.7 K/uL   Basophils Relative 1 %   Basophils Absolute 0.1 0 - 0.1 K/uL  Magnesium     Status: None   Collection Time: 07/01/15  9:36 PM  Result Value Ref Range   Magnesium 1.7 1.7 - 2.4 mg/dL  TSH     Status: Abnormal   Collection Time: 07/02/15  5:53 AM  Result Value Ref Range   TSH 0.041 (L) 0.350 - 4.500 uIU/mL  Hemoglobin A1c     Status: None   Collection Time: 07/02/15  5:53 AM  Result Value Ref Range   Hgb A1c MFr Bld 5.7 4.0 - 6.0 %  Glucose, capillary     Status: Abnormal   Collection Time: 07/02/15  7:54 AM  Result Value Ref Range   Glucose-Capillary 155 (H) 65 - 99 mg/dL   Comment 1 Notify RN   T4, free     Status: None   Collection Time: 07/02/15  1:36 PM  Result Value Ref Range   Free T4 0.85 0.61 - 1.12 ng/dL  T3, Free     Status: None   Collection Time: 07/02/15  1:36 PM  Result Value Ref Range   T3, Free 3.9 2.0 - 4.4 pg/mL    Comment: (NOTE) Performed At: [facility] [place], [place] [id number] Lindon Romp MD PF:[id number]    Glucose, capillary     Status: Abnormal   Collection Time: 07/02/15  9:07 PM  Result Value Ref Range   Glucose-Capillary 205 (H) 65 - 99 mg/dL  CBC     Status: Abnormal   Collection Time: 07/03/15  4:45 AM  Result Value Ref Range   WBC 11.2 (H) 3.6 - 11.0 K/uL   RBC 4.02 3.80 - 5.20 MIL/uL   Hemoglobin 10.4 (L) 12.0 - 16.0 g/dL   HCT 32.8 (L) 35.0 - 47.0 %   MCV 81.6 80.0 - 100.0 fL   MCH 25.8 (L) 26.0 - 34.0 pg   MCHC 31.6 (L) 32.0 - 36.0 g/dL   RDW 17.0 (H) 11.5 - 14.5 %   Platelets 197 150 - 440 K/uL  Basic metabolic panel     Status: Abnormal   Collection Time: 07/03/15  4:45 AM  Result Value Ref Range   Sodium 143 135 - 145 mmol/L   Potassium 4.2 3.5 - 5.1 mmol/L   Chloride 114 (H) 101 - 111 mmol/L   CO2 24 22 - 32 mmol/L   Glucose, Bld 129 (H) 65 - 99 mg/dL   BUN 24 (H) 6 - 20 mg/dL   Creatinine, Ser 0.53 0.44 - 1.00 mg/dL   Calcium 8.7 (L) 8.9 - 10.3 mg/dL   GFR calc non Af Amer >60 >60 mL/min   GFR calc Af Amer >60 >60 mL/min    Comment: (NOTE) The eGFR has been calculated using the CKD EPI equation. This calculation has not been validated in all clinical situations. eGFR's persistently <60 mL/min signify possible Chronic Kidney Disease.    Anion gap 5 5 - 15  Glucose, capillary     Status: Abnormal   Collection Time: 07/03/15  7:41 AM  Result Value Ref Range   Glucose-Capillary 104 (H) 65 - 99 mg/dL  Glucose, capillary     Status: Abnormal   Collection Time: 07/03/15 11:14 AM  Result Value Ref Range   Glucose-Capillary 125 (H) 65 - 99 mg/dL    Dg Pelvis 1-2 Views  07/01/2015  CLINICAL DATA:  Acute onset of lower extremity cramping and spasms. Initial encounter. EXAM: PELVIS - 1-2 VIEW COMPARISON:  Abdominal radiograph performed 10/11/2014 FINDINGS: There is no evidence of fracture or dislocation. Both femoral heads are seated normally within their respective acetabula. No significant degenerative change is appreciated. The sacroiliac joints are unremarkable  in appearance. Lumbar spinal fusion hardware is noted at L4-L5. The visualized bowel gas pattern is grossly unremarkable in appearance. Scattered phleboliths are noted within the pelvis. A large chronic osseous fragment is noted superior to the left femoral neck, possibly within the overlying soft tissues. IMPRESSION: No evidence of fracture or dislocation. Electronically Signed   By: Garald Balding M.D.   On: 07/01/2015 23:23   Mr Knee Left  Wo Contrast  07/02/2015  CLINICAL DATA:  69 year old with anterior knee pain and limited range of motion for 1 day. No acute injury or prior relevant surgery. Initial encounter. EXAM: MRI OF THE LEFT KNEE WITHOUT CONTRAST TECHNIQUE: Multiplanar, multisequence MR imaging of the knee was performed. No intravenous contrast was administered. COMPARISON:  None. FINDINGS: MENISCI Medial meniscus: Extensive free edge degenerative tearing of the meniscal body and posterior horn. There is probable ossification peripherally in the posterior horn. The meniscal body is largely extruded from the joint. No centrally displaced meniscal fragment identified. Lateral meniscus:  Intact with normal morphology. LIGAMENTS Cruciates:  Intact. Collaterals: Intact. There is MCL degeneration and medial buckling related to the meniscal extrusion. CARTILAGE Patellofemoral: Relatively mild patellar chondral thinning and surface irregularity with prominent osteophytes. Medial: Advanced osteoarthritis with diffuse chondral thinning, subchondral eburnation and large osteophytes. Lateral: Mild chondral thinning with prominent osteophyte formation. OTHER Joint: Moderate-sized joint effusion. There are multiple intra-articular loose bodies. Loose body in the suprapatellar recess measures up to 1.8 cm. There is a large loose body posteriorly, measuring up to 3.1 cm. This demonstrates heterogeneous T1 and T2 signal. Popliteal Fossa: Large posterior loose bodies as described above. No significant Baker's cyst.  Extensor Mechanism:  Intact. Bones:  No acute or significant extra-articular osseous findings. IMPRESSION: 1. 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 2. Multiple intra-articular loose bodies, largest posteriorly. These likely contribute to the patient's limited range of motion. 3. Diffuse degenerative tearing of the posterior horn and body of the medial meniscus with associated meniscal ossification. 4. The lateral meniscus, cruciate and collateral ligaments are intact. Electronically Signed   By: Richardean Sale M.D.   On: 07/02/2015 16:31   Dg Knee Left Port  07/02/2015  CLINICAL DATA:  Left knee pain and decreased range of motion beginning yesterday. No known injury. EXAM: PORTABLE LEFT KNEE - 1-2 VIEW COMPARISON:  None. FINDINGS: No evidence of fracture or dislocation. Severe tricompartmental osteoarthritis is seen. Small knee joint effusion is seen with ossified joint body in the suprapatellar bursa. IMPRESSION: Severe tricompartmental osteoarthritis. Small knee joint effusion with ossified joint body. Electronically Signed   By: Earle Gell M.D.   On: 07/02/2015 19:12    Review of Systems  Constitutional: Negative for fever, chills, weight loss, malaise/fatigue and diaphoresis.  HENT: Negative.   Eyes: Negative.   Respiratory: Negative.   Cardiovascular: Negative.   Gastrointestinal: Negative.   Genitourinary: Negative.   Musculoskeletal: Positive for joint pain. Negative for myalgias, back pain, falls and neck pain.  Skin: Negative.   Neurological: Positive for weakness.  Endo/Heme/Allergies: Negative.    Blood pressure 157/56, pulse 63, temperature 98.4 F (36.9 C), temperature source Oral, resp. rate 18, height _0  (1.6 m), weight  70.308 kg (155 lb), SpO2 94 %. Physical Exam  Nursing note and vitals reviewed. Constitutional: She is oriented to person, place, and time. She appears well-developed and well-nourished. No distress.  HENT:  Head:  Normocephalic and atraumatic.  Right Ear: External ear normal.  Left Ear: External ear normal.  Nose: Nose normal.  Mouth/Throat: Oropharynx is clear and moist.  Eyes: Conjunctivae and EOM are normal. Pupils are equal, round, and reactive to light. No scleral icterus.  Neck: Normal range of motion. Neck supple.  Cardiovascular: Normal rate, regular rhythm, normal heart sounds and intact distal pulses.   No murmur heard. Respiratory: Effort normal and breath sounds normal. No respiratory distress.  GI: Soft. Bowel sounds are normal.  Musculoskeletal: She exhibits edema and tenderness.  Neurological: She is alert and oriented to person, place, and time. She has normal reflexes. She displays normal reflexes. No cranial nerve deficit. She exhibits normal muscle tone. Coordination normal.  Skin: Skin is warm. She is not diaphoretic. There is erythema.    Assessment/Plan: 1.  Multiple sclerosis-  Stable, no sign of exacerbation -  Continue outpatient immunomodulators -  No need for MRI -  No need for steroids -  Will sign off, please call with questions -  Needs to f/u with her regular Neurologist as scheduled  Billiejo Sorto 07/03/2015, 2:20 PM

## 2015-07-03 NOTE — Telephone Encounter (Signed)
Pt's husband called and states that she has been in the hospital for 3 days now. Due to MS symptoms, legs hurting, weak. Husband wanted to let Dr. Epimenio Foot know.

## 2015-07-03 NOTE — Progress Notes (Signed)
NIFM of -20cm

## 2015-07-03 NOTE — Telephone Encounter (Signed)
LMOM to let Roe Coombs know that I received his message, please call if he needs anything/fim

## 2015-07-03 NOTE — Progress Notes (Signed)
Endocrinology Follow up note:  S: Brandi Gibbs is a 69 y.o. female with PMH Multiple Sclerosis, Movement disorder, RLS, HTN, and Hyperthyroidism admitted with lower extremity pain, weakness, and fatigue. Endocrinology has been consulted regarding hyperthyroidism. She is known to me from the outpatient setting and was seen in clinic just last week. She is on a regimen of methimazole 10 mg daily.  Today she reports significant pain/cramping in her knee. She was started back on methimazole 10 mg once daily.   O:   Filed Vitals:   07/03/15 1116 07/03/15 1121  BP: 190/60 190/67  Pulse: 57 59  Temp: 98.2 F (36.8 C)   Resp: 17   Physical Exam: Gen: NAD, chronically ill-appearing woman, sitting in bed teary  Neuro: AAOx3, normal concentration, speech at baseline Neck: Thyroid: moves well with swallowing, normal size, no masses appreciated Pulmonary: breathing unlabored on room air GI: soft non tender  Extremities: no edema Psyche: normal affect with good insight into their medical conditions  Labs: Component     Latest Ref Rng 07/02/2015  TSH     0.350 - 4.500 uIU/mL 0.041 (L)  Free T4     0.61 - 1.12 ng/dL 1.61  T3, Free     2.0 - 4.4 pg/mL 3.9    Assessment:  Hyperthyroidism, stable  Plan:  Continue methimazole 10 mg once daily Follow up as scheduled with me on 08/18/2015. Thank you for this consult. Will sign off.  Doylene Canning, MD [facility] Endocrinology

## 2015-07-03 NOTE — Care Management (Signed)
Spoke with patient's husband and patient regarding home health agencies from list I provided- they would like use [facility]. I have notified Edwina with [facility]. No further RNCM needs. Case closed.

## 2015-07-03 NOTE — Progress Notes (Signed)
Patient to go home with home health, discharge papers reviewed with spouse, EMS called to take patient home, home and new medications reviewed with patient. Rx.slip given to spouse. Spouse verbalized understanding of discharge instructions given. Home medication return to spouse.

## 2015-07-03 NOTE — Progress Notes (Signed)
EMS staff here to take patient home.  

## 2015-07-29 ENCOUNTER — Telehealth: Payer: Self-pay | Admitting: Neurology

## 2015-07-29 MED ORDER — TERIFLUNOMIDE 14 MG PO TABS: 14.0000 mg | ORAL_TABLET | Freq: Every day | ORAL | Status: AC

## 2015-07-29 NOTE — Telephone Encounter (Signed)
I have spoken with Roe Coombs this morning.  He verifies that Tomorrow has not had any recent lapse in treatment with Aubagio.  Sts. she has 17 tablets left.  New rx. escribed to [facility].  PA on Aubagio is good thru October 2017.  I have advised monthly labs were drawn when Joyclyn started Aubagio, now will only recheck at reg. appt's or prn.  He verbalized understanding of same./fim

## 2015-07-29 NOTE — Telephone Encounter (Signed)
Pt's husband called and states pt needs Aubagio rx . Pt does use mail order pharm . Pt did not know she needed lab work while taking medication. Please call and advise [phone]

## 2015-08-28 ENCOUNTER — Ambulatory Visit (INDEPENDENT_AMBULATORY_CARE_PROVIDER_SITE_OTHER): Payer: Medicare Other | Admitting: Neurology

## 2015-08-28 ENCOUNTER — Encounter: Payer: Self-pay | Admitting: Neurology

## 2015-08-28 VITALS — BP 156/82 | HR 72 | Resp 16 | Ht 63.0 in | Wt 144.0 lb

## 2015-08-28 DIAGNOSIS — G35 Multiple sclerosis: Secondary | ICD-10-CM | POA: Diagnosis not present

## 2015-08-28 DIAGNOSIS — R258 Other abnormal involuntary movements: Secondary | ICD-10-CM

## 2015-08-28 DIAGNOSIS — R260 Ataxic gait: Secondary | ICD-10-CM

## 2015-08-28 DIAGNOSIS — G4700 Insomnia, unspecified: Secondary | ICD-10-CM | POA: Diagnosis not present

## 2015-08-28 DIAGNOSIS — R4189 Other symptoms and signs involving cognitive functions and awareness: Secondary | ICD-10-CM

## 2015-08-28 DIAGNOSIS — R269 Unspecified abnormalities of gait and mobility: Secondary | ICD-10-CM | POA: Diagnosis not present

## 2015-08-28 DIAGNOSIS — R5383 Other fatigue: Secondary | ICD-10-CM | POA: Diagnosis not present

## 2015-08-28 DIAGNOSIS — M545 Low back pain, unspecified: Secondary | ICD-10-CM

## 2015-08-28 DIAGNOSIS — R252 Cramp and spasm: Secondary | ICD-10-CM

## 2015-08-28 DIAGNOSIS — G35D Multiple sclerosis, unspecified: Secondary | ICD-10-CM

## 2015-08-28 NOTE — Progress Notes (Signed)
[facility]  PATIENT: Brandi Gibbs DOB: 05/10/1946  REFERRING DOCTOR OR PCP:  Dorothey Baseman SOURCE: Patient  _________________________________   HISTORICAL  CHIEF COMPLAINT:  Chief Complaint  Patient presents with  . Multiple Sclerosis    Sts. she is back to taking Aubagio as rx'd--for several mos. she was unable to get Aubagio--mostly due to miscommunication with the pharmacy--they were not able to contact her to arrange delivery.  She is ambulatory with a rolling waker today.  Dragging right foot--she has an afo but is not wearing it today b/c shoes aren't wide enough.  Sts. vision in both eyes is decreased--not blurry or double.  She will make an appt. with her opthalmologist./fim    HISTORY OF PRESENT ILLNESS:  Brandi Gibbs is a 70 year old with multiple sclerosis.    She switched to Aubagio last year but was off 2-3 months due to pharmacy issues.    She tolerates it well.  She has not noted any new exacerbations since the last visit.   Gait/strength/sensation:  She is stable and is able to walk > 100 feet with a walker.   Some days are better than others.   She uses it around thre house, too.  Her right leg is worse with more weakness and more spasticity than the left leg.  At night, her legs will sometime have a phasic spasm.   She has fallen once in the last month. She denies any significant numbness, tingling or dysesthesias in the legs.  Bladder:  She has difficulty with her bladder.   She has urinary hesitancy as well as frequency. She has a lot of incontinence.    She is on oxybutynin.  No UTI since the last visit..     Vision:  She notes more blurry vision. She denies double vision. There is no eye pain. She feels both eyes are worse.    She has not seen ophthalmology x many years..   She has glasses but does not wear them.     Fatigue/sleep:   She has physical and cognitive fatigue that is unchanged.   However, she has a lot of sleepiness but at night  has insomnia.   Insomnia is about the same and is both sleep onset and maintenance. She reports that she snores and her husband has told her that she has some irregular breathing at night. She has excessive daytime sleepiness but has turned down PSG multiple times.   She feels she wpuld never wear a mask or use an oral appliance and does not wish to proceed.     She has RLS but not every night.     Mood/cognition:  She denies any significant depression.   No anxiety.  She has noted difficulty with short-term memory, verbal fluency, apathy and with executive function.   Right shoulder pain:   This continues to do well.   A subacromial bursa injeciton last year greatly helped  MS History:   She was diagnosed in the mid 1980s after presenting with right sided paralysis. She had MRIs and a lumbar puncture performed. They were reportedly consistent with multiple sclerosis. Initially, she was not placed on any medications. She had several exacerbations though in the 1990s that affected her gait. I started to see her around 2001 or 2002. At that time, her gait was poor and she needed a cane for short distance and walker for longer distance we started Avonex and she tolerated it well. MRIs did not show any major  changes since that time. However, she has continued to have some worsening of her gait and now needs to use a walker full-time and also cannot walk as far. She started Aubagi 691      REVIEW OF SYSTEMS: Constitutional: No fevers, chills, sweats, or change in appetite.   She is very tired all the time Eyes: Some visual changes, but no double vision or eye pain Ear, nose and throat: No hearing loss, ear pain, nasal congestion, sore throat Cardiovascular: No chest pain, palpitations Respiratory: No shortness of breath at rest or with exertion.   No wheezes GastrointestinaI: No nausea, vomiting, diarrhea, abdominal pain, fecal incontinence Genitourinary: see above. Musculoskeletal: Mild neck pain,  moderate back pain Integumentary: No rash, pruritus, skin lesions Neurological: as above Psychiatric: No depression at this time.  No anxiety. However, apathetic and cognitive changes (as above) Endocrine: No palpitations, diaphoresis, change in appetite, change in weigh or increased thirst Hematologic/Lymphatic: No anemia, purpura, petechiae. Allergic/Immunologic: No itchy/runny eyes, nasal congestion, recent allergic reactions, rashes  ALLERGIES: Allergies  Allergen Reactions  . Other Other (See Comments)    Blood thinners - GI bleed  . Prednisone Other (See Comments)    Causes hair to fall out   . Omeprazole Rash    HOME MEDICATIONS:  Current outpatient prescriptions:  .  baclofen (LIORESAL) 10 MG tablet, Take 1 tablet (10 mg total) by mouth 3 (three) times daily., Disp: 90 each, Rfl: 11 .  diltiazem (DILACOR XR) 180 MG 24 hr capsule, Take 180 mg by mouth daily., Disp: , Rfl:  .  lisinopril (PRINIVIL,ZESTRIL) 40 MG tablet, Take 40 mg by mouth daily. , Disp: , Rfl:  .  methimazole (TAPAZOLE) 10 MG tablet, Take 1 tablet (10 mg total) by mouth daily., Disp: 30 tablet, Rfl: 0 .  metoprolol tartrate (LOPRESSOR) 25 MG tablet, Take 25 mg by mouth 2 (two) times daily., Disp: , Rfl:  .  oxyCODONE-acetaminophen (PERCOCET/ROXICET) 5-325 MG tablet, Take 1 tablet by mouth every 4 (four) hours as needed for moderate pain., Disp: 30 tablet, Rfl: 0 .  pramipexole (MIRAPEX) 0.5 MG tablet, Take 1 tablet (0.5 mg total) by mouth at bedtime., Disp: 30 tablet, Rfl: 11 .  simvastatin (ZOCOR) 40 MG tablet, Take 40 mg by mouth daily., Disp: , Rfl:  .  Teriflunomide 14 MG TABS, Take 14 mg by mouth daily., Disp: 90 tablet, Rfl: 3 .  predniSONE (DELTASONE) 10 MG tablet, Take 6 tablets (60 mg total) by mouth daily with breakfast. (Patient not taking: Reported on 08/28/2015), Disp: 30 tablet, Rfl: 0  PAST MEDICAL HISTORY: Past Medical History  Diagnosis Date  . Multiple sclerosis ([facility])   . Movement disorder    . Hypertension   . Vision abnormalities   . Restless leg syndrome   . Allergy   . Anxiety   . Arthritis   . GERD (gastroesophageal reflux disease)   . Neuromuscular disorder ([facility])   . Seizures ([facility])   . Thyroid disease     PAST SURGICAL HISTORY: Past Surgical History  Procedure Laterality Date  . Partial hysterectomy    . Back surgery      FAMILY HISTORY: Family History  Problem Relation Age of Onset  . Leukemia Mother   . Healthy Father     SOCIAL HISTORY:  Social History   Social History  . Marital Status: Married    Spouse Name: N/A  . Number of Children: N/A  . Years of Education: N/A   Occupational History  .  Not on file.   Social History Main Topics  . Smoking status: Former Smoker    Types: Cigarettes  . Smokeless tobacco: Not on file  . Alcohol Use: No     Comment: rare/fim  . Drug Use: No  . Sexual Activity: No   Other Topics Concern  . Not on file   Social History Narrative     PHYSICAL EXAM  Filed Vitals:   08/28/15 1114  BP: 156/82  Pulse: 72  Resp: 16  Height: 5\' 3"  (1.6 m)  Weight: 144 lb (65.318 kg)    Body mass index is 25.51 kg/(m^2).   General: The patient is well-developed and well-nourished and in no acute distress  Musculoskeletal:  Back is nontender.   Shoulders are nontender  today  Neurologic Exam  Mental status: The patient is alert and oriented x 3 at the time of the examination. The patient has reduced attention span and concentration ability.   Speech is normal.  Cranial nerves: Extraocular movements are full. .  Facial symmetry is present. There is good facial sensation to soft touch bilaterally.Facial strength is normal.  Trapezius and sternocleidomastoid strength is normal. No dysarthria is noted.  Mild symmetric hearing loss  Motor:  Muscle bulk is normal.   Tone is increased in both legs, slightly more on the rightl. Strength is  5 / 5 in left arms, 4+ right arm (drift and reduced RAM), 4/5 in right  foot and 4+/5 in left foot. 4/5 proximally.   Sensory: Sensory testing is intact to pinprick, soft touch and vibration sensation is symmetric today  Coordination: Cerebellar testing reveals reduced right finger-nose-finger and reduced bilateral heel-to-shin .  Gait and station: She can stand independently.   Gait is spastic and off balanced and requires bilateral support with walker.  She has  mildright foot drop.   Reflexes: Deep tendon reflexes are symmetric and increased bilaterally, worse on right.  Spread at both knees.   Nonsustained right ankle clonus       DIAGNOSTIC DATA (LABS, IMAGING, TESTING) - I reviewed patient records, labs, notes, testing and imaging myself where available.     ASSESSMENT AND PLAN  Multiple sclerosis (HCC)  Abnormal gait  Ataxic gait  Cognitive changes  Spasticity  Other fatigue  Insomnia  Midline low back pain without sciatica   1. Aubagio for MS.  Call us if any more problems getting medication.   She will need to have monthly LFTs for a few more months and she can get these closer to her home in [place] if her PCP can do.   Otherwise, we can set up here.        2.   A sleep study was again recommended for her sleepiness and fatigue and possibly witness OSA. However, she does not wish to proceed with this as she would not use CPAP or an oral appliance 3.  Walk some every day. Try to stay active.   4.   She will return in 5 months or sooner if she has new or worsening neurologic symptoms.   Richard A. Epimenio Foot, MD, PhD 08/28/2015, 11:30 AM Certified in Neurology, Clinical Neurophysiology, Sleep Medicine, Pain Medicine and Neuroimaging  [facility] [address] [place], [place] [address] [phone]

## 2015-08-29 LAB — HEPATIC FUNCTION PANEL
ALBUMIN: 4.1 g/dL (ref 3.6–4.8)
ALT: 13 IU/L (ref 0–32)
AST: 21 IU/L (ref 0–40)
Alkaline Phosphatase: 124 IU/L — ABNORMAL HIGH (ref 39–117)
Bilirubin Total: <0.2 mg/dL (ref 0.0–1.2)
Bilirubin, Direct: 0.07 mg/dL (ref 0.00–0.40)
TOTAL PROTEIN: 6.5 g/dL (ref 6.0–8.5)

## 2015-10-13 ENCOUNTER — Telehealth: Payer: Self-pay | Admitting: Neurology

## 2015-10-13 NOTE — Telephone Encounter (Signed)
I have spoken with Brandi Gibbs and given [facility] address.  [address] [place].  She verbalized understanding of same.  I offered their phone # but she sts. she has it.  I have advised she call them for directions as I have never been there--am not familiar with most of [place], so am unable to give directions./fim

## 2015-10-13 NOTE — Telephone Encounter (Signed)
Pt called to speak with Faith. She wanted to know if she know were [facility] was located. Please call and advise [phone]

## 2015-10-14 ENCOUNTER — Other Ambulatory Visit: Payer: Self-pay | Admitting: Neurology

## 2015-10-17 ENCOUNTER — Other Ambulatory Visit: Payer: Self-pay | Admitting: Neurology

## 2015-10-22 ENCOUNTER — Other Ambulatory Visit: Payer: Self-pay | Admitting: Neurology

## 2015-10-22 MED ORDER — PRAMIPEXOLE DIHYDROCHLORIDE 0.5 MG PO TABS: 0.5000 mg | ORAL_TABLET | Freq: Every day | ORAL | Status: DC

## 2015-10-22 NOTE — Telephone Encounter (Signed)
Pt's husband called about a refill for pramipexole (MIRAPEX) 0.5 MG tablet. Pt will be out of medication in two days. Please call and advise

## 2015-10-22 NOTE — Telephone Encounter (Signed)
Rx sent in

## 2016-01-06 ENCOUNTER — Telehealth: Payer: Self-pay | Admitting: Neurology

## 2016-01-06 NOTE — Telephone Encounter (Signed)
Noted/fim 

## 2016-01-06 NOTE — Telephone Encounter (Addendum)
Darel Hong @ [phone] is sending a home assistance form for the [employer] for Dr. Epimenio Foot to sign. I did let her know he is out of town this week.

## 2016-02-25 ENCOUNTER — Ambulatory Visit (INDEPENDENT_AMBULATORY_CARE_PROVIDER_SITE_OTHER): Payer: Medicare Other | Admitting: Neurology

## 2016-02-25 ENCOUNTER — Encounter: Payer: Self-pay | Admitting: Neurology

## 2016-02-25 VITALS — BP 182/68 | HR 63 | Ht 63.0 in | Wt 162.5 lb

## 2016-02-25 DIAGNOSIS — R260 Ataxic gait: Secondary | ICD-10-CM | POA: Diagnosis not present

## 2016-02-25 DIAGNOSIS — G2581 Restless legs syndrome: Secondary | ICD-10-CM

## 2016-02-25 DIAGNOSIS — G35 Multiple sclerosis: Secondary | ICD-10-CM

## 2016-02-25 DIAGNOSIS — E559 Vitamin D deficiency, unspecified: Secondary | ICD-10-CM | POA: Diagnosis not present

## 2016-02-25 DIAGNOSIS — R5383 Other fatigue: Secondary | ICD-10-CM

## 2016-02-25 DIAGNOSIS — R4189 Other symptoms and signs involving cognitive functions and awareness: Secondary | ICD-10-CM | POA: Diagnosis not present

## 2016-02-25 NOTE — Progress Notes (Signed)
[facility]  PATIENT: Brandi Gibbs DOB: 05-10-1946  REFERRING DOCTOR OR PCP:  Dorothey Baseman SOURCE: Patient  _________________________________   HISTORICAL  CHIEF COMPLAINT:  Chief Complaint  Patient presents with  . Multiple Sclerosis    She is here with her daughter-in-law, Darel Hong.      HISTORY OF PRESENT ILLNESS:  Brandi Gibbs is a 70 year old with multiple sclerosis.    She has been on Aubagio since last year.    She tolerates it well.  She has not noted any new exacerbations since the last visit.  Fatigue is a big problem  Gait/strength/sensation:  She feels gait has mildly progressed since her last visit. She is able to walk > 30 feet with a walker.  She has fallen once a month. Some days are better than others.     Her right leg is worse with more weakness and more spasticity than the left leg.   She denies any significant numbness, tingling or dysesthesias in the legs.   Although the baclofen as prescribed at 10 mg by mouth 3 times a day, she usually just takes all 3 pills at the same time in the morning. She notes a lot of spasticity at night  Bladder:  She has difficulty with her bladder.   She has urinary hesitancy as well as frequency. She has a lot of incontinence.    She is on oxybutynin.  No UTI since the last visit..     Vision:  She notes mild blurry vision but denies double vision. There is no eye pain.    She has glasses but does not wear them.     Fatigue/sleep:   She has physical and cognitive fatigue that is unchanged.   However, she has a lot of sleepiness but at night has insomnia.   Insomnia is about the same and is both sleep onset and maintenance. She reports that she snores and her husband has told her that she has some irregular breathing at night. She has excessive daytime sleepiness but has turned down PSG multiple times.    She has RLS but not every night.     Mood/cognition:  She denies any significant depression.   No anxiety.   She has noted difficulty with short-term memory, verbal fluency, apathy and with executive function.    MS History:   She was diagnosed in the mid 1980s after presenting with right sided paralysis. She had MRIs and a lumbar puncture performed. They were reportedly consistent with multiple sclerosis. Initially, she was not placed on any medications. She had several exacerbations though in the 1990s that affected her gait. I started to see her around 2001 or 2002. At that time, her gait was poor and she needed a cane for short distance and walker for longer distance we started Avonex and she tolerated it well. MRIs did not show any major changes since that time. However, she has continued to have some worsening of her gait and now needs to use a walker full-time and also cannot walk as far. She started Aubagi 201      REVIEW OF SYSTEMS: Constitutional: No fevers, chills, sweats, or change in appetite.   She is very tired all the time Eyes: Some visual changes, but no double vision or eye pain Ear, nose and throat: No hearing loss, ear pain, nasal congestion, sore throat Cardiovascular: No chest pain, palpitations Respiratory: No shortness of breath at rest or with exertion.   No wheezes GastrointestinaI: No nausea,  vomiting, diarrhea, abdominal pain, fecal incontinence Genitourinary: see above. Musculoskeletal: Mild neck pain, moderate back pain Integumentary: No rash, pruritus, skin lesions Neurological: as above Psychiatric: No depression at this time.  No anxiety. However, apathetic and cognitive changes (as above) Endocrine: No palpitations, diaphoresis, change in appetite, change in weigh or increased thirst Hematologic/Lymphatic: No anemia, purpura, petechiae. Allergic/Immunologic: No itchy/runny eyes, nasal congestion, recent allergic reactions, rashes  ALLERGIES: Allergies  Allergen Reactions  . Other Other (See Comments)    Blood thinners - GI bleed  . Prednisone Other (See  Comments)    Causes hair to fall out   . Omeprazole Rash    HOME MEDICATIONS:  Current Outpatient Prescriptions:  .  baclofen (LIORESAL) 10 MG tablet, Take 1 tablet (10 mg total) by mouth 3 (three) times daily., Disp: 90 each, Rfl: 11 .  diltiazem (DILACOR XR) 180 MG 24 hr capsule, Take 180 mg by mouth daily., Disp: , Rfl:  .  lisinopril (PRINIVIL,ZESTRIL) 40 MG tablet, Take 40 mg by mouth daily. , Disp: , Rfl:  .  methimazole (TAPAZOLE) 10 MG tablet, Take 1 tablet (10 mg total) by mouth daily., Disp: 30 tablet, Rfl: 0 .  metoprolol tartrate (LOPRESSOR) 25 MG tablet, Take 25 mg by mouth 2 (two) times daily., Disp: , Rfl:  .  pramipexole (MIRAPEX) 0.5 MG tablet, TAKE 1 TABLET 3 TIMES DAILY, Disp: 90 tablet, Rfl: 11 .  simvastatin (ZOCOR) 40 MG tablet, Take 40 mg by mouth daily., Disp: , Rfl:  .  Teriflunomide 14 MG TABS, Take 14 mg by mouth daily., Disp: 90 tablet, Rfl: 3  PAST MEDICAL HISTORY: Past Medical History:  Diagnosis Date  . Allergy   . Anxiety   . Arthritis   . GERD (gastroesophageal reflux disease)   . Hypertension   . Movement disorder   . Multiple sclerosis (HCC)   . Neuromuscular disorder (HCC)   . Restless leg syndrome   . Seizures (HCC)   . Thyroid disease   . Vision abnormalities     PAST SURGICAL HISTORY: Past Surgical History:  Procedure Laterality Date  . BACK SURGERY    . PARTIAL HYSTERECTOMY      FAMILY HISTORY: Family History  Problem Relation Age of Onset  . Leukemia Mother   . Healthy Father     SOCIAL HISTORY:  Social History   Social History  . Marital status: Married    Spouse name: N/A  . Number of children: N/A  . Years of education: N/A   Occupational History  . Not on file.   Social History Main Topics  . Smoking status: Former Smoker    Types: Cigarettes  . Smokeless tobacco: Not on file  . Alcohol use No     Comment: rare/fim  . Drug use: No  . Sexual activity: No   Other Topics Concern  . Not on file   Social  History Narrative  . No narrative on file     PHYSICAL EXAM  Vitals:   02/25/16 1149  BP: (!) 182/68  Pulse: 63  Weight: 162 lb 8 oz (73.7 kg)  Height: 5\' 3"  (1.6 m)    Body mass index is 28.79 kg/m.   General: The patient is well-developed and well-nourished and in no acute distress  Musculoskeletal:  Back is nontender.   Shoulders are nontender  today  Neurologic Exam  Mental status: The patient is alert and oriented x 3 at the time of the examination. The patient has reduced attention span  and concentration ability.   Speech is normal.  Cranial nerves: Extraocular movements are full. .  Facial symmetry is present. There is good facial sensation to soft touch bilaterally.Facial strength is normal.  Trapezius and sternocleidomastoid strength is normal. No dysarthria is noted.  Mild symmetric hearing loss  Motor:  Muscle bulk is normal.   Tone is increased in both legs, slightly more on the rightl. Strength is  5 / 5 in left arms, 4+ right arm (drift and reduced RAM), 4/5 in right foot and 4+/5 in left foot. 4/5 proximally.   Sensory: Sensory testing is intact to pinprick, soft touch and vibration sensation is symmetric today  Coordination: Cerebellar testing reveals reduced right finger-nose-finger and reduced bilateral heel-to-shin .  Gait and station: She can stand independently.   Gait is spastic and off balanced and requires bilateral support with walker.  She has a right foot drop.   Reflexes: Deep tendon reflexes are symmetric and increased bilaterally, worse on right.  Spread at both knees.   Nonsustained right ankle clonus       DIAGNOSTIC DATA (LABS, IMAGING, TESTING) - I reviewed patient records, labs, notes, testing and imaging myself where available.     ASSESSMENT AND PLAN  Multiple sclerosis (HCC) - Plan: Hepatic function panel, CBC with Differential/Platelet, VITAMIN D 25 Hydroxy (Vit-D Deficiency, Fractures)  Ataxic gait  Cognitive  changes  Other fatigue  Restless leg  Vitamin D deficiency - Plan: VITAMIN D 25 Hydroxy (Vit-D Deficiency, Fractures)    1.  Continue Aubagio for MS.     We will check the hepatic function panel, CBC with differential and vitamin D to assess for hepatotoxicity, lymphopenia and vitamin D deficiency.     2.    Walk some every day (with walker around home). Try to stay active.   3.    Advised to take OTC vitamin D.   if she is found to be deficient on labs today, we'll recommend prescription strength. 4.  Her daughter-in-law will get her a weekly pill case with multiple sclerosis per day so that she will be better about taking her medications throughout the day and not just in the morning. 5.    She will return in 5 - 66months or sooner if she has new or worsening neurologic symptoms.   Alfa Leibensperger A. Epimenio Foot, MD, PhD 02/25/2016, 11:54 AM Certified in Neurology, Clinical Neurophysiology, Sleep Medicine, Pain Medicine and Neuroimaging  [facility] [address], [address] [place], [place] [address] [phone]

## 2016-02-26 LAB — CBC WITH DIFFERENTIAL/PLATELET
Basophils Absolute: 0 10*3/uL (ref 0.0–0.2)
Basos: 1 %
EOS (ABSOLUTE): 0.4 10*3/uL (ref 0.0–0.4)
Eos: 6 %
Hematocrit: 37.9 % (ref 34.0–46.6)
Hemoglobin: 12.3 g/dL (ref 11.1–15.9)
Immature Grans (Abs): 0 10*3/uL (ref 0.0–0.1)
Immature Granulocytes: 0 %
Lymphocytes Absolute: 1.5 10*3/uL (ref 0.7–3.1)
Lymphs: 22 %
MCH: 28 pg (ref 26.6–33.0)
MCHC: 32.5 g/dL (ref 31.5–35.7)
MCV: 86 fL (ref 79–97)
Monocytes Absolute: 0.8 10*3/uL (ref 0.1–0.9)
Monocytes: 12 %
Neutrophils Absolute: 3.9 10*3/uL (ref 1.4–7.0)
Neutrophils: 59 %
Platelets: 229 10*3/uL (ref 150–379)
RBC: 4.4 x10E6/uL (ref 3.77–5.28)
RDW: 15.3 % (ref 12.3–15.4)
WBC: 6.6 10*3/uL (ref 3.4–10.8)

## 2016-02-26 LAB — HEPATIC FUNCTION PANEL
ALT: 17 IU/L (ref 0–32)
AST: 23 IU/L (ref 0–40)
Albumin: 4.2 g/dL (ref 3.6–4.8)
Alkaline Phosphatase: 111 IU/L (ref 39–117)
Bilirubin Total: 0.2 mg/dL (ref 0.0–1.2)
Bilirubin, Direct: 0.11 mg/dL (ref 0.00–0.40)
TOTAL PROTEIN: 6.8 g/dL (ref 6.0–8.5)

## 2016-02-26 LAB — VITAMIN D 25 HYDROXY (VIT D DEFICIENCY, FRACTURES): Vit D, 25-Hydroxy: 30.4 ng/mL (ref 30.0–100.0)

## 2016-03-01 ENCOUNTER — Telehealth: Payer: Self-pay | Admitting: *Deleted

## 2016-03-01 NOTE — Telephone Encounter (Signed)
LMTC./fim 

## 2016-03-01 NOTE — Telephone Encounter (Signed)
-----   Message from Asa Lente, MD sent at 02/26/2016  5:04 PM EDT ----- Vitamin D is on the low range of normal. I would like her to take 2000-5000 units over-the-counter vitamin D daily.   She can get that at any grocery store or drug store.

## 2016-04-28 ENCOUNTER — Telehealth: Payer: Self-pay | Admitting: *Deleted

## 2016-04-28 NOTE — Telephone Encounter (Signed)
Aubagio PA completed and faxed to [facility] fax# [phone]/fim 

## 2016-04-29 NOTE — Telephone Encounter (Signed)
Per Ronette Deter is on plan's list of covered drugs and a PA is not required.  If the pharmacy has further questions, they will need to contact the pharmacy help desk at [phone]. (Pt [id number], PA [phone])

## 2016-05-19 ENCOUNTER — Other Ambulatory Visit: Payer: Self-pay | Admitting: Neurology

## 2016-05-20 ENCOUNTER — Other Ambulatory Visit: Payer: Self-pay | Admitting: Family Medicine

## 2016-05-20 DIAGNOSIS — Z1239 Encounter for other screening for malignant neoplasm of breast: Secondary | ICD-10-CM

## 2016-06-24 ENCOUNTER — Telehealth: Payer: Self-pay

## 2016-06-24 NOTE — Telephone Encounter (Signed)
[facility], they have been unable to reach pt at the number they have. Pt has been mailed a letter. (We have the same number that [facility] has as well.) [facility] advised us that if we hear from the pt, we need to get an updated number from her and let her know that [facility] to [facility] her regarding her aubagio.

## 2016-06-24 NOTE — Telephone Encounter (Signed)
Noted/fim 

## 2016-06-28 ENCOUNTER — Emergency Department: Payer: Medicare (Managed Care)

## 2016-06-28 ENCOUNTER — Emergency Department
Admission: EM | Admit: 2016-06-28 | Discharge: 2016-06-28 | Disposition: A | Discharge status: Home / Self Care | Payer: Medicare (Managed Care) | Attending: Emergency Medicine | Admitting: Emergency Medicine

## 2016-06-28 DIAGNOSIS — R0602 Shortness of breath: Secondary | ICD-10-CM | POA: Diagnosis not present

## 2016-06-28 DIAGNOSIS — R296 Repeated falls: Secondary | ICD-10-CM | POA: Insufficient documentation

## 2016-06-28 DIAGNOSIS — Z87891 Personal history of nicotine dependence: Secondary | ICD-10-CM | POA: Diagnosis not present

## 2016-06-28 DIAGNOSIS — R32 Unspecified urinary incontinence: Secondary | ICD-10-CM | POA: Diagnosis not present

## 2016-06-28 DIAGNOSIS — E049 Nontoxic goiter, unspecified: Secondary | ICD-10-CM | POA: Insufficient documentation

## 2016-06-28 DIAGNOSIS — Z791 Long term (current) use of non-steroidal anti-inflammatories (NSAID): Secondary | ICD-10-CM | POA: Diagnosis not present

## 2016-06-28 DIAGNOSIS — G35 Multiple sclerosis: Secondary | ICD-10-CM

## 2016-06-28 DIAGNOSIS — G549 Nerve root and plexus disorder, unspecified: Secondary | ICD-10-CM

## 2016-06-28 DIAGNOSIS — I10 Essential (primary) hypertension: Secondary | ICD-10-CM | POA: Insufficient documentation

## 2016-06-28 DIAGNOSIS — Z79899 Other long term (current) drug therapy: Secondary | ICD-10-CM | POA: Insufficient documentation

## 2016-06-28 DIAGNOSIS — R0902 Hypoxemia: Secondary | ICD-10-CM

## 2016-06-28 DIAGNOSIS — G55 Nerve root and plexus compressions in diseases classified elsewhere: Secondary | ICD-10-CM | POA: Diagnosis not present

## 2016-06-28 LAB — URINALYSIS COMPLETE WITH MICROSCOPIC (ARMC ONLY)
BILIRUBIN URINE: NEGATIVE
Glucose, UA: NEGATIVE mg/dL
HGB URINE DIPSTICK: NEGATIVE
LEUKOCYTES UA: NEGATIVE
NITRITE: NEGATIVE
PH: 6 (ref 5.0–8.0)
PROTEIN: 30 mg/dL — AB
SPECIFIC GRAVITY, URINE: 1.018 (ref 1.005–1.030)

## 2016-06-28 LAB — BASIC METABOLIC PANEL
Anion gap: 6 (ref 5–15)
BUN: 25 mg/dL — ABNORMAL HIGH (ref 6–20)
CHLORIDE: 108 mmol/L (ref 101–111)
CO2: 27 mmol/L (ref 22–32)
Calcium: 9 mg/dL (ref 8.9–10.3)
Creatinine, Ser: 0.77 mg/dL (ref 0.44–1.00)
GFR calc Af Amer: >60 mL/min (ref >60)
Glucose, Bld: 150 mg/dL — ABNORMAL HIGH (ref 65–99)
POTASSIUM: 3.4 mmol/L — AB (ref 3.5–5.1)
SODIUM: 141 mmol/L (ref 135–145)

## 2016-06-28 LAB — CBC WITH DIFFERENTIAL/PLATELET
BASOS ABS: 0.1 10*3/uL (ref 0–0.1)
Basophils Relative: 1 %
EOS ABS: 0.3 10*3/uL (ref 0–0.7)
EOS PCT: 5 %
HCT: 35.3 % (ref 35.0–47.0)
HEMOGLOBIN: 11.5 g/dL — AB (ref 12.0–16.0)
LYMPHS ABS: 0.6 10*3/uL — AB (ref 1.0–3.6)
LYMPHS PCT: 10 %
MCH: 26.4 pg (ref 26.0–34.0)
MCHC: 32.7 g/dL (ref 32.0–36.0)
MCV: 80.9 fL (ref 80.0–100.0)
Monocytes Absolute: 0.7 10*3/uL (ref 0.2–0.9)
Monocytes Relative: 11 %
NEUTROS PCT: 73 %
Neutro Abs: 4.8 10*3/uL (ref 1.4–6.5)
PLATELETS: 235 10*3/uL (ref 150–440)
RBC: 4.37 MIL/uL (ref 3.80–5.20)
RDW: 15.3 % — ABNORMAL HIGH (ref 11.5–14.5)
WBC: 6.5 10*3/uL (ref 3.6–11.0)

## 2016-06-28 LAB — TROPONIN I: Troponin I: <0.03 ng/mL (ref <0.03)

## 2016-06-28 LAB — CK: Total CK: 152 U/L (ref 38–234)

## 2016-06-28 MED ORDER — IOPAMIDOL (ISOVUE-370) INJECTION 76%
75.0000 mL | Freq: Once | INTRAVENOUS | Status: AC | PRN
  Administered 2016-06-28: 75 mL via INTRAVENOUS

## 2016-06-28 MED ORDER — METOPROLOL TARTRATE 25 MG PO TABS
25.0000 mg | ORAL_TABLET | Freq: Once | ORAL | Status: AC
  Administered 2016-06-28: 25 mg via ORAL
  Filled 2016-06-28: qty 1

## 2016-06-28 MED ORDER — HYDRALAZINE HCL 20 MG/ML IJ SOLN
10.0000 mg | Freq: Once | INTRAMUSCULAR | Status: AC
  Administered 2016-06-28: 10 mg via INTRAVENOUS
  Filled 2016-06-28: qty 1

## 2016-06-28 MED ORDER — LISINOPRIL 20 MG PO TABS
40.0000 mg | ORAL_TABLET | Freq: Once | ORAL | Status: AC
  Administered 2016-06-28: 40 mg via ORAL
  Filled 2016-06-28: qty 2

## 2016-06-28 NOTE — ED Notes (Signed)
Patient transported to CT 

## 2016-06-28 NOTE — ED Triage Notes (Addendum)
Pt arrives to ED from home via [facility] with c/o an unwitnessed fall. EMS reports pt has multiple falls recently (EMS states called out 2x in last 24 hrs with pt refusing transfer). EMS reports pt was found wedged between a dresser and bed and had been there for an unknown amount of time. Pt denies any head injury or LOC, but does c/o bilateral leg and knee pain; no obvious injury or deformity appreciated. EMS states pt "may need to be placed in the decon shower" as pt arrived incontinent of both bowel and bladder.  Pt is A&O, in NAD, with respirations even, regular, and unlabored.

## 2016-06-28 NOTE — ED Provider Notes (Signed)
[facility] Emergency Department Provider Note  ____________________________________________  Time seen: Approximately 3:15 PM  I have reviewed the triage vital signs and the nursing notes.   HISTORY  Chief Complaint Fall    HPI Brandi Gibbs is a 70 y.o. female with a history of dementia, multiple sclerosis, HTN, medication noncompliance, noncompliance with her wheelchair resulting in frequent falls, presenting for fall. The patient and her husband report that over the last few days, the patient has had multiple falls when she has tried to stand up and walk, even though she is supposed to use her wheelchair. She has not had any associated injury or syncope. On arrival to the emergency department she has urinary and fecal incontinence, but according to her [facility] physician, this is chronic.The patient is reporting low back pain, which is also chronic and unchanged.   Past Medical History:  Diagnosis Date  . Allergy   . Anxiety   . Arthritis   . GERD (gastroesophageal reflux disease)   . Hypertension   . Movement disorder   . Multiple sclerosis (HCC)   . Neuromuscular disorder (HCC)   . Restless leg syndrome   . Seizures (HCC)   . Thyroid disease   . Vision abnormalities     Patient Active Problem List   Diagnosis Date Noted  . Vitamin D deficiency 02/25/2016  . Right shoulder pain 12/24/2014  . Arthralgia of shoulder 12/24/2014  . Altered mental state 12/06/2014  . Abnormal mental state 12/06/2014  . Ataxic gait 09/11/2014  . Cognitive changes 09/11/2014  . Other fatigue 09/11/2014  . Urinary hesitancy 09/11/2014  . Spasticity 09/11/2014  . Midline low back pain without sciatica 09/11/2014  . Insomnia 09/11/2014  . Restless leg 09/11/2014  . Snoring 09/11/2014  . Abnormal involuntary movement 09/11/2014  . Abnormal gait 09/11/2014  . Abnormal respiratory rate 09/11/2014  . Other symptoms and signs involving cognitive functions and awareness  09/11/2014  . LBP (low back pain) 09/11/2014  . Does not feel right 09/11/2014  . DS (disseminated sclerosis) (HCC) 03/28/2014  . Osteopenia 03/28/2014  . Hyperthyroidism 03/28/2014  . BP (high blood pressure) 03/28/2014  . HLD (hyperlipidemia) 03/28/2014  . Essential (primary) hypertension 03/28/2014  . 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 Multiple sclerosis (HCC) 03/28/2014    Past Surgical History:  Procedure Laterality Date  . BACK SURGERY    . PARTIAL HYSTERECTOMY      Current Outpatient Rx  . Order #: [id number] Class: Historical Med  . Order #: [id number] Class: Historical Med  . Order #: [id number] Class: Normal  . Order #: [id number] Class: Historical Med  . Order #: [id number] Class: Historical Med  . Order #: [id number] Class: No Print  . Order #: [id number] Class: Historical Med  . Order #: [id number] Class: Normal  . Order #: [id number] Class: Normal  . Order #: [id number] Class: Historical Med  . Order #: [id number] Class: Normal  . Order #: [id number] Class: Historical Med    Allergies Other; Prednisone; and Omeprazole  Family History  Problem Relation Age of Onset  . Leukemia Mother   . Healthy Father     Social History Social History  Substance Use Topics  . Smoking status: Former Smoker    Types: Cigarettes  . Smokeless tobacco: Never Used  . Alcohol use No     Comment: rare/fim    Review of Systems Constitutional: No fever/chills.Positive recurrent fall. Negative lightheadedness or syncope. No loss of consciousness. Did not strike head. Eyes: Poor vision but this is unchanged. ENT: No sore  throat. No congestion or rhinorrhea. Cardiovascular: Denies chest pain. Denies palpitations. Respiratory: Denies shortness of breath.  No cough. Gastrointestinal: No abdominal pain.  No nausea, no vomiting.  No diarrhea.  No constipation. Genitourinary: Negative for dysuria. Musculoskeletal: Positive for chronic low back pain. Negative for neck  pain. Skin: Negative for rash. Neurological: Negative for headaches. Chronic lower extremity weakness and numbness  10-point ROS otherwise negative.  ____________________________________________   PHYSICAL EXAM:  VITAL SIGNS: ED Triage Vitals  Enc Vitals Group     BP 06/28/16 0530 (!) 212/72     Pulse Rate 06/28/16 0530 93     Resp 06/28/16 0530 18     Temp 06/28/16 0530 98.2 F (36.8 C)     Temp Source 06/28/16 0530 Oral     SpO2 06/28/16 0530 91 %     Weight 06/28/16 0531 164 lb 3.2 oz (74.5 kg)     Height 06/28/16 0531 5\' 3"  (1.6 m)     Head Circumference --      Peak Flow --      Pain Score 06/28/16 0532 1     Pain Loc --      Pain Edu? --      Excl. in GC? --     Constitutional:Patient is alert to person, month and place. She does not know the year "20 thousand". According to her [facility] physician who is at the bedside, the patient is at her normal mental status. Eyes: Conjunctivae are normal.  EOMI. No scleral icterus. PERRLA. Head: Atraumatic. No raccoon eyes or Battle sign. Nose: No congestion/rhinnorhea. No swelling over theno septal hematoma. Mouth/Throat: Mucous membranes are moist. No dental injury or malocclusion. Neck: No stridor.  Supple.  No midline C-spine tenderness to palpation, step-offs or deformities. No meningismus. Cardiovascular: Normal rate, regular rhythm. No murmurs, rubs or gallops.  Respiratory: Normal respiratory effort.  No accessory muscle use or retractions. Lungs CTAB.  No wheezes, rales or ronchi. Gastrointestinal: Soft, nontender and nondistended.  No guarding or rebound.  No peritoneal signs. Musculoskeletal: No LE edema. No ttp in the calves or palpable cords.  Negative Homan's sign. No tenderness to palpation in the midline in the C-spine or T-spine, mild tenderness to palpation in the lower lumbar spine which the patient states is chronic. Neurologic: Alert. Speech is clear with occasional mumbling. Face and smile are symmetric. EOMI and  PERRLA with a nystagmus. 5 out of 5 bilateral grip, biceps, triceps strength bilaterally. 5 out of 5 dorsiflexion and plantar flexion bilaterally. Decreased sensation to light touch in the lower extremities bilaterally. Gait examination is deferred as patient is generally nonambulatory. Skin:  Skin is warm, dry and intact. No rash noted. Psychiatric: Mood and affect are normal. Speech and behavior are normal.  Normal judgement.  ____________________________________________   LABS (all labs ordered are listed, but only abnormal results are displayed)  Labs Reviewed  CBC WITH DIFFERENTIAL/PLATELET - Abnormal; Notable for the following:       Result Value   Hemoglobin 11.5 (*)    RDW 15.3 (*)    Lymphs Abs 0.6 (*)    All other components within normal limits  BASIC METABOLIC PANEL - Abnormal; Notable for the following:    Potassium 3.4 (*)    Glucose, Bld 150 (*)    BUN 25 (*)    All other components within normal limits  URINALYSIS COMPLETEWITH MICROSCOPIC ([facility] ONLY) - Abnormal; Notable for the following:    Color, Urine YELLOW (*)    APPearance  CLEAR (*)    Ketones, ur TRACE (*)    Protein, ur 30 (*)    Bacteria, UA RARE (*)    Squamous Epithelial / LPF 0-5 (*)    All other components within normal limits  TROPONIN I  CK   ____________________________________________  EKG  ED ECG REPORT I, Rockne Menghini, the attending physician, personally viewed and interpreted this ECG.   Date: 06/28/2016  EKG Time: 552  Rate: 81  Rhythm: normal sinus rhythm with PVC  Axis: Normal  Intervals:none  ST&T Change: No ST elevation.  ____________________________________________  RADIOLOGY  Dg Chest 2 View  Result Date: 06/28/2016 CLINICAL DATA:  Fall. EXAM: CHEST  2 VIEW COMPARISON:  12/06/2014 . FINDINGS: Mediastinum and hilar structures are normal. Low lung volumes with mild basilar atelectasis. No pleural effusion or pneumothorax. Degenerative changes thoracic spine and  left shoulder. IMPRESSION: No acute abnormality. Electronically Signed   By: Maisie Fus  Register   On: 06/28/2016 09:53   Ct Head Wo Contrast  Result Date: 06/28/2016 CLINICAL DATA:  70 year old female with unwitnessed fall. Multiple recent falls. Patient denies loss of consciousness. History of hypertension and multiple sclerosis. Initial encounter. EXAM: CT HEAD WITHOUT CONTRAST TECHNIQUE: Contiguous axial images were obtained from the base of the skull through the vertex without intravenous contrast. COMPARISON:  12/06/2014 head CT.  10/14/2014 brain MR. FINDINGS: Brain: No intracranial hemorrhage. Remote infarcts/prominent white matter changes (which may reflect result of ischemia and/or demyelinating process given provided history). No CT evidence of large acute infarct. Global atrophy without hydrocephalus. No intracranial mass lesion noted on this unenhanced exam. Vascular: Vascular calcifications. Skull: No skull fracture. Hyperostosis frontalis interna. Dural calcifications. Sinuses/Orbits: No acute orbital abnormality. Orbits not entirely imaged. Visualized paranasal sinuses, mastoid air cells and middle ear cavities are clear. Small osteoma left frontal sinus. Other: Negative. IMPRESSION: No skull fracture or intracranial hemorrhage. Remote infarcts/prominent white matter changes (which may reflect result of ischemia and/or demyelinating process given provided history). No CT evidence of large acute infarct. Global atrophy. Electronically Signed   By: Lacy Duverney M.D.   On: 06/28/2016 10:34   Ct Angio Chest Pe W And/or Wo Contrast  Result Date: 06/28/2016 CLINICAL DATA:  70 year old hypertensive female with with un witnessed fall. Shortness of breath and hypoxia upon arrival. Multiple sclerosis. Subsequent encounter. EXAM: CT ANGIOGRAPHY CHEST WITH CONTRAST TECHNIQUE: Multidetector CT imaging of the chest was performed using the standard protocol during bolus administration of intravenous contrast.  Multiplanar CT image reconstructions and MIPs were obtained to evaluate the vascular anatomy. CONTRAST:  75 cc Isovue 370. COMPARISON:  06/28/2016 chest x-ray. No comparison chest CT. Head CT performed same date is dictated separately. FINDINGS: Cardiovascular: No pulmonary embolus or aortic dissection. Prominent irregular plaque throughout the thoracic aorta. Coronary artery calcifications.  Heart top-normal slightly enlarged. Mediastinum/Nodes: Normal/top-normal size mediastinal and hilar lymph nodes. Lungs/Pleura: Bibasilar atelectasis. Minimal pulmonary vascular prominence. Upper Abdomen: No worrisome abnormality. Under distended stomach with limited evaluation. Musculoskeletal: Thoracic kyphoscoliosis with superimposed degenerative changes. Enlarged irregular right lobe of thyroid gland. Thyroid ultrasound recommended for further delineation. Review of the MIP images confirms the above findings. IMPRESSION: No pulmonary embolus or aortic dissection. Prominent irregular plaque throughout the thoracic aorta. Coronary artery calcifications. Heart top-normal to slightly enlarged. Normal/top-normal size mediastinal and hilar lymph nodes. Bibasilar atelectasis.  Minimal pulmonary vascular prominence. **An incidental finding of potential clinical significance has been found. Enlarged irregular right lobe of thyroid gland. Thyroid ultrasound recommended for further delineation. ** Electronically Signed  By: Lacy Duverney M.D.   On: 06/28/2016 10:54   Mr Brain Wo Contrast  Result Date: 06/28/2016 CLINICAL DATA:  Multiple sclerosis.  Hypoxia. EXAM: MRI HEAD WITHOUT CONTRAST TECHNIQUE: Multiplanar, multiecho pulse sequences of the brain and surrounding structures were obtained without intravenous contrast. COMPARISON:  CT head 06/28/2016.  MRI head 10/14/2014 FINDINGS: Brain: Negative for acute infarction. Moderate atrophy extensive diffuse white matter abnormality with confluent and focal areas of hyperintensity  throughout both cerebral hemispheres. Multiple well-defined cystic areas in the basal ganglia and thalamus bilaterally most compatible with advanced chronic ischemic change. Chronic cystic change in the pons bilaterally compatible with chronic infarction. Cerebellum otherwise negative. Negative for hemorrhage or mass. No shift of the midline structures. Vascular: Normal flow voids Skull and upper cervical spine: Negative Sinuses/Orbits: Negative Other: None IMPRESSION: No acute abnormality Moderate atrophy. Extensive diffuse white matter hyperintensity which may be due to chronic ischemia or demyelinating disease. Extensive changes in the basal ganglia and brainstem most compatible with severe chronic ischemic change. Electronically Signed   By: Marlan Palau M.D.   On: 06/28/2016 12:50   Mr Lumbar Spine Wo Contrast  Result Date: 06/28/2016 CLINICAL DATA:  Incontinence. Multiple sclerosis. Lumbar fusion. Fall. EXAM: MRI LUMBAR SPINE WITHOUT CONTRAST TECHNIQUE: Multiplanar, multisequence MR imaging of the lumbar spine was performed. No intravenous contrast was administered. COMPARISON:  Lumbar MRI 11/18/2009 FINDINGS: Segmentation: Image quality degraded by motion. Normal segmentation Alignment: 4 mm anterior listhesis has improved since the prior study following PLIF. Remaining alignment normal. Vertebrae:  Negative for fracture or mass Conus medullaris: Extends to the L1-2 level and appears normal. Paraspinal and other soft tissues: Cholelithiasis. No retroperitoneal mass or adenopathy Disc levels: L1-2:  Mild facet degeneration without canal stenosis L2-3: Disc bulging and facet hypertrophy. Moderate spinal stenosis has progressed in the interval L3-4: Severe spinal stenosis has progressed in the interval with diffuse disc bulging and severe facet hypertrophy. Extruded disc fragment on the right with upgoing disc material compressing the right L3 nerve root. This is new. L4-5:  Status post PLIF.  No  significant stenosis L5-S1: Mild facet degeneration.  No significant stenosis IMPRESSION: Moderate spinal stenosis L2-3 Severe spinal stenosis L3-4 which has progressed in the interval. Moderately large extruded disc fragment on the right with upgoing disc material compressing the right L3 nerve root. Status post PLIF at L4-5 without significant stenosis. Electronically Signed   By: Marlan Palau M.D.   On: 06/28/2016 12:54    ____________________________________________   PROCEDURES  Procedure(s) performed: None  Procedures  Critical Care performed: No ____________________________________________   INITIAL IMPRESSION / ASSESSMENT AND PLAN / ED COURSE  Pertinent labs & imaging results that were available during my care of the patient were reviewed by me and considered in my medical decision making (see chart for details).  70 y.o. female with a history of MS, wheelchair bound, presenting with multiple falls. Overall, the patient's recent history is consistent with noncompliance with her wheelchair use. Both with the patient's given history, and her physicians history, it appears that this is an ongoing chronic issue for her. We will do a traumatic evaluation with CT had. Given her bowel and bladder incontinence, I will also get a MRI of the brain and lumbar spine. We'll do basic blood work, urinalysis, and reevaluate the patient for final disposition.  ----------------------------------------- 3:22 PM on 06/28/2016 -----------------------------------------  The patient has continued to be hemodynamically stable in the emergency department. Her blood pressure has become elevated, but this is  likely because she was here and did not take her blood pressure medications. I will treat her with her home blood pressure medications and reevaluate her blood pressure.  The patient's MRI shows multiple areas of lumbar stenosis including a compression of L3 nerve root. Given that the patient has  symptoms that are chronic, rather than anything acute, I agree with the plan made in conjunction with Dr. Teola BradleyBarr, the neurosurgeon on-call, to have the patient evaluated in his outpatient clinic. There is no evidence for cauda equina syndrome or spinal cord compression which would require immediate attention.  ____________________________________________  FINAL CLINICAL IMPRESSION(S) / ED DIAGNOSES  Final diagnoses:  Incontinence  Enlarged thyroid  Recurrent falls while walking  Nerve root compression  Hypertension, unspecified type    Clinical Course as of Jun 29 1523  Mon Jun 28, 2016  1351 The patient's discharge blood pressure is 190/105. He did this on 2 separate blood pressure medications, which she did not take today. He does not remember the names of the blood pressure medications he is on. He is not having any symptoms including chest pain, headache, blurred vision. I have offered him blood pressure reduction here, but he prefers to go home and take his blood pressure medications at home. He understands return precautions.  [AN]    Clinical Course User Index [AN] Rockne MenghiniAnne-Caroline Tyre Beaver, MD      NEW MEDICATIONS STARTED DURING THIS VISIT:  New Prescriptions   No medications on file      Rockne MenghiniAnne-Caroline Royanne Warshaw, MD 06/28/16 1524

## 2016-06-28 NOTE — Discharge Instructions (Signed)
To prevent falls, please get help with moving, and used your wheelchair as decided with your primary care physician.  Please make an appointment with Dr. Teola Bradley, the neurosurgery doctor, to discuss your back pain and abnormal spine findings on your MRI.  Return to the emergency department if you develop severe pain, new numbness tingling or weakness, fever, or any other symptoms concerning to you.

## 2016-06-28 NOTE — ED Notes (Signed)
[facility] contacted for ride for discharge. Pt does not require oxygen at home. Pt SpO2 95% RA.

## 2016-06-28 NOTE — ED Notes (Signed)
Patient transported to MRI 

## 2016-06-28 NOTE — ED Notes (Signed)
Pt incontinent of both bowel and bladder upon arrival. Pt cleaned of urine and stool, peri-care performed by Gwynneth MunsonButch, RN and Dawn, RN.

## 2016-07-01 ENCOUNTER — Telehealth: Payer: Self-pay | Admitting: Neurology

## 2016-07-01 NOTE — Telephone Encounter (Signed)
Brandi Gibbs  Brooks with [facility] is calling to discuss the patient's care and did not want to go into details.

## 2016-07-01 NOTE — Telephone Encounter (Signed)
I have spoken with Brandi Gibbs this afternoon.  She sts. Brandi Gibbs is now enrolled in the [facility] program; would like to update [name] that pt. is falling more than she reports. No serious injuries.  No other concerns voiced. Sts. pt. is compliant with Aubagio--she is getting it thru the [facility] pharmacy.  Pt. last seen 02-25-16; next appt. is in Feb. 2018.  I have offered sooner appt., but [name] sts. she must have transporter for [facility] call to make this appt./fim

## 2016-07-06 ENCOUNTER — Telehealth: Payer: Self-pay | Admitting: *Deleted

## 2016-07-06 NOTE — Telephone Encounter (Signed)
Received call from Fox ChaseEbony, w/[facility] program. She stated she needs to schedule sooner follow up with Dr Epimenio FootSater for patient, at provider's request. She does not know which provider is requesting. Clarise CruzGave Ebony Dr Bonnita HollowSater's available Dec dates. She stated none of those dates would work with their transporter.  Will have RN call her to see if patient can be worked in this month.

## 2016-07-06 NOTE — Telephone Encounter (Signed)
I have spoken with [name] this afternoon and given appt. for Brandi Gibbs on 07-08-16 at 1pm/fim

## 2016-07-08 ENCOUNTER — Ambulatory Visit: Payer: Self-pay | Admitting: Neurology

## 2016-07-13 ENCOUNTER — Encounter: Payer: Self-pay | Admitting: Neurology

## 2016-08-30 ENCOUNTER — Ambulatory Visit: Payer: Medicare Other | Admitting: Neurology

## 2016-10-26 ENCOUNTER — Encounter: Payer: Self-pay | Admitting: Emergency Medicine

## 2016-10-26 ENCOUNTER — Emergency Department: Payer: Medicare (Managed Care)

## 2016-10-26 ENCOUNTER — Inpatient Hospital Stay
Admission: EM | Admit: 2016-10-26 | Discharge: 2016-11-23 | DRG: 871 | Disposition: E | Payer: Medicare (Managed Care) | Attending: Internal Medicine | Admitting: Internal Medicine

## 2016-10-26 ENCOUNTER — Inpatient Hospital Stay (HOSPITAL_COMMUNITY)
Admit: 2016-10-26 | Discharge: 2016-10-26 | Disposition: A | Discharge status: Home / Self Care | Payer: Medicare (Managed Care) | Attending: Internal Medicine | Admitting: Internal Medicine

## 2016-10-26 ENCOUNTER — Inpatient Hospital Stay: Payer: Medicare (Managed Care)

## 2016-10-26 DIAGNOSIS — R0600 Dyspnea, unspecified: Secondary | ICD-10-CM

## 2016-10-26 DIAGNOSIS — Z515 Encounter for palliative care: Secondary | ICD-10-CM | POA: Diagnosis not present

## 2016-10-26 DIAGNOSIS — Z993 Dependence on wheelchair: Secondary | ICD-10-CM | POA: Diagnosis not present

## 2016-10-26 DIAGNOSIS — E872 Acidosis, unspecified: Secondary | ICD-10-CM

## 2016-10-26 DIAGNOSIS — Z66 Do not resuscitate: Secondary | ICD-10-CM | POA: Diagnosis present

## 2016-10-26 DIAGNOSIS — Z79899 Other long term (current) drug therapy: Secondary | ICD-10-CM

## 2016-10-26 DIAGNOSIS — D649 Anemia, unspecified: Secondary | ICD-10-CM | POA: Diagnosis present

## 2016-10-26 DIAGNOSIS — E0590 Thyrotoxicosis, unspecified without thyrotoxic crisis or storm: Secondary | ICD-10-CM | POA: Diagnosis present

## 2016-10-26 DIAGNOSIS — R6521 Severe sepsis with septic shock: Secondary | ICD-10-CM | POA: Diagnosis present

## 2016-10-26 DIAGNOSIS — N319 Neuromuscular dysfunction of bladder, unspecified: Secondary | ICD-10-CM | POA: Diagnosis present

## 2016-10-26 DIAGNOSIS — I4891 Unspecified atrial fibrillation: Secondary | ICD-10-CM

## 2016-10-26 DIAGNOSIS — E213 Hyperparathyroidism, unspecified: Secondary | ICD-10-CM | POA: Diagnosis present

## 2016-10-26 DIAGNOSIS — J9601 Acute respiratory failure with hypoxia: Secondary | ICD-10-CM | POA: Diagnosis present

## 2016-10-26 DIAGNOSIS — G2581 Restless legs syndrome: Secondary | ICD-10-CM | POA: Diagnosis present

## 2016-10-26 DIAGNOSIS — R739 Hyperglycemia, unspecified: Secondary | ICD-10-CM | POA: Diagnosis present

## 2016-10-26 DIAGNOSIS — A419 Sepsis, unspecified organism: Secondary | ICD-10-CM | POA: Diagnosis present

## 2016-10-26 DIAGNOSIS — Z7189 Other specified counseling: Secondary | ICD-10-CM

## 2016-10-26 DIAGNOSIS — J810 Acute pulmonary edema: Secondary | ICD-10-CM | POA: Diagnosis not present

## 2016-10-26 DIAGNOSIS — J9690 Respiratory failure, unspecified, unspecified whether with hypoxia or hypercapnia: Secondary | ICD-10-CM | POA: Diagnosis present

## 2016-10-26 DIAGNOSIS — I480 Paroxysmal atrial fibrillation: Secondary | ICD-10-CM | POA: Diagnosis present

## 2016-10-26 DIAGNOSIS — Z4659 Encounter for fitting and adjustment of other gastrointestinal appliance and device: Secondary | ICD-10-CM

## 2016-10-26 DIAGNOSIS — I482 Chronic atrial fibrillation: Secondary | ICD-10-CM | POA: Diagnosis present

## 2016-10-26 DIAGNOSIS — D72829 Elevated white blood cell count, unspecified: Secondary | ICD-10-CM

## 2016-10-26 DIAGNOSIS — R7989 Other specified abnormal findings of blood chemistry: Secondary | ICD-10-CM

## 2016-10-26 DIAGNOSIS — Z87891 Personal history of nicotine dependence: Secondary | ICD-10-CM

## 2016-10-26 DIAGNOSIS — G9340 Encephalopathy, unspecified: Secondary | ICD-10-CM | POA: Diagnosis present

## 2016-10-26 DIAGNOSIS — N179 Acute kidney failure, unspecified: Secondary | ICD-10-CM | POA: Diagnosis present

## 2016-10-26 DIAGNOSIS — R4189 Other symptoms and signs involving cognitive functions and awareness: Secondary | ICD-10-CM | POA: Diagnosis present

## 2016-10-26 DIAGNOSIS — J690 Pneumonitis due to inhalation of food and vomit: Secondary | ICD-10-CM | POA: Diagnosis not present

## 2016-10-26 DIAGNOSIS — J189 Pneumonia, unspecified organism: Secondary | ICD-10-CM | POA: Diagnosis present

## 2016-10-26 DIAGNOSIS — R0603 Acute respiratory distress: Secondary | ICD-10-CM | POA: Diagnosis not present

## 2016-10-26 DIAGNOSIS — Z978 Presence of other specified devices: Secondary | ICD-10-CM

## 2016-10-26 DIAGNOSIS — J9600 Acute respiratory failure, unspecified whether with hypoxia or hypercapnia: Secondary | ICD-10-CM | POA: Diagnosis not present

## 2016-10-26 DIAGNOSIS — R778 Other specified abnormalities of plasma proteins: Secondary | ICD-10-CM | POA: Diagnosis present

## 2016-10-26 DIAGNOSIS — K219 Gastro-esophageal reflux disease without esophagitis: Secondary | ICD-10-CM | POA: Diagnosis present

## 2016-10-26 DIAGNOSIS — Z806 Family history of leukemia: Secondary | ICD-10-CM

## 2016-10-26 DIAGNOSIS — G35 Multiple sclerosis: Secondary | ICD-10-CM | POA: Diagnosis present

## 2016-10-26 DIAGNOSIS — I10 Essential (primary) hypertension: Secondary | ICD-10-CM | POA: Diagnosis present

## 2016-10-26 DIAGNOSIS — M62838 Other muscle spasm: Secondary | ICD-10-CM | POA: Diagnosis present

## 2016-10-26 DIAGNOSIS — J181 Lobar pneumonia, unspecified organism: Secondary | ICD-10-CM

## 2016-10-26 HISTORY — DX: Thyrotoxicosis, unspecified without thyrotoxic crisis or storm: E05.90

## 2016-10-26 HISTORY — DX: Paroxysmal atrial fibrillation: I48.0

## 2016-10-26 HISTORY — DX: Acute kidney failure, unspecified: N17.9

## 2016-10-26 LAB — TROPONIN I
TROPONIN I: 0.05 ng/mL — AB (ref <0.03)
Troponin I: 0.03 ng/mL (ref <0.03)
Troponin I: 0.05 ng/mL (ref <0.03)

## 2016-10-26 LAB — BLOOD GAS, ARTERIAL
ACID-BASE DEFICIT: 2.4 mmol/L — AB (ref 0.0–2.0)
Acid-base deficit: 3 mmol/L — ABNORMAL HIGH (ref 0.0–2.0)
BICARBONATE: 20.4 mmol/L (ref 20.0–28.0)
Bicarbonate: 21.2 mmol/L (ref 20.0–28.0)
Delivery systems: POSITIVE
Delivery systems: POSITIVE
Expiratory PAP: 6
Expiratory PAP: 6
FIO2: 1
FIO2: 0.95
INSPIRATORY PAP: 18
Inspiratory PAP: 18
MECHANICAL RATE: 12
Mechanical Rate: 12
O2 SAT: 99.5 %
O2 SAT: 99.9 %
PATIENT TEMPERATURE: 37
PCO2 ART: 32 mmHg (ref 32.0–48.0)
PCO2 ART: 30 mmHg — AB (ref 32.0–48.0)
PO2 ART: 158 mmHg — AB (ref 83.0–108.0)
PO2 ART: 291 mmHg — AB (ref 83.0–108.0)
Patient temperature: 37
RATE: 40 resp/min
pH, Arterial: 7.43 (ref 7.350–7.450)
pH, Arterial: 7.44 (ref 7.350–7.450)

## 2016-10-26 LAB — COMPREHENSIVE METABOLIC PANEL
ALBUMIN: 3.7 g/dL (ref 3.5–5.0)
ALT: 26 U/L (ref 14–54)
ANION GAP: 12 (ref 5–15)
AST: 36 U/L (ref 15–41)
Alkaline Phosphatase: 121 U/L (ref 38–126)
BUN: 17 mg/dL (ref 6–20)
CO2: 21 mmol/L — AB (ref 22–32)
Calcium: 8.9 mg/dL (ref 8.9–10.3)
Chloride: 106 mmol/L (ref 101–111)
Creatinine, Ser: 0.82 mg/dL (ref 0.44–1.00)
GFR calc Af Amer: >60 mL/min (ref >60)
GFR calc non Af Amer: >60 mL/min (ref >60)
Glucose, Bld: 178 mg/dL — ABNORMAL HIGH (ref 65–99)
POTASSIUM: 3.9 mmol/L (ref 3.5–5.1)
Sodium: 139 mmol/L (ref 135–145)
Total Bilirubin: 0.9 mg/dL (ref 0.3–1.2)
Total Protein: 7.3 g/dL (ref 6.5–8.1)

## 2016-10-26 LAB — CBC WITH DIFFERENTIAL/PLATELET
BASOS PCT: 1 %
Basophils Absolute: 0.1 10*3/uL (ref 0–0.1)
Eosinophils Absolute: 0 10*3/uL (ref 0–0.7)
Eosinophils Relative: 0 %
HEMATOCRIT: 36.1 % (ref 35.0–47.0)
HEMOGLOBIN: 11.2 g/dL — AB (ref 12.0–16.0)
LYMPHS ABS: 0.7 10*3/uL — AB (ref 1.0–3.6)
Lymphocytes Relative: 5 %
MCH: 23 pg — AB (ref 26.0–34.0)
MCHC: 31 g/dL — AB (ref 32.0–36.0)
MCV: 74.3 fL — ABNORMAL LOW (ref 80.0–100.0)
MONOS PCT: 6 %
Monocytes Absolute: 0.8 10*3/uL (ref 0.2–0.9)
NEUTROS ABS: 11.7 10*3/uL — AB (ref 1.4–6.5)
NEUTROS PCT: 88 %
Platelets: 361 10*3/uL (ref 150–440)
RBC: 4.86 MIL/uL (ref 3.80–5.20)
RDW: 18.6 % — ABNORMAL HIGH (ref 11.5–14.5)
WBC: 13.2 10*3/uL — AB (ref 3.6–11.0)

## 2016-10-26 LAB — LACTIC ACID, PLASMA
LACTIC ACID, VENOUS: 3 mmol/L — AB (ref 0.5–1.9)
Lactic Acid, Venous: 2.1 mmol/L (ref 0.5–1.9)

## 2016-10-26 LAB — URINALYSIS, COMPLETE (UACMP) WITH MICROSCOPIC
Bacteria, UA: NONE SEEN
Bilirubin Urine: NEGATIVE
GLUCOSE, UA: NEGATIVE mg/dL
Ketones, ur: 20 mg/dL — AB
Leukocytes, UA: NEGATIVE
NITRITE: NEGATIVE
PH: 5 (ref 5.0–8.0)
PROTEIN: 100 mg/dL — AB
SPECIFIC GRAVITY, URINE: 1.025 (ref 1.005–1.030)

## 2016-10-26 LAB — TSH: TSH: 1.765 u[IU]/mL (ref 0.350–4.500)

## 2016-10-26 LAB — MRSA PCR SCREENING: MRSA BY PCR: NEGATIVE

## 2016-10-26 LAB — TRIGLYCERIDES: Triglycerides: 122 mg/dL (ref <150)

## 2016-10-26 LAB — INFLUENZA PANEL BY PCR (TYPE A & B)
INFLBPCR: NEGATIVE
Influenza A By PCR: NEGATIVE

## 2016-10-26 LAB — PROCALCITONIN: Procalcitonin: <0.1 ng/mL

## 2016-10-26 LAB — HEPARIN LEVEL (UNFRACTIONATED)
HEPARIN UNFRACTIONATED: 0.31 [IU]/mL (ref 0.30–0.70)
HEPARIN UNFRACTIONATED: 0.55 [IU]/mL (ref 0.30–0.70)

## 2016-10-26 LAB — BRAIN NATRIURETIC PEPTIDE
B NATRIURETIC PEPTIDE 5: 680 pg/mL — AB (ref 0.0–100.0)
B Natriuretic Peptide: 779 pg/mL — ABNORMAL HIGH (ref 0.0–100.0)

## 2016-10-26 MED ORDER — ACETAMINOPHEN 650 MG RE SUPP
650.0000 mg | Freq: Four times a day (QID) | RECTAL | Status: DC | PRN
Start: 2016-10-26 — End: 2016-10-31
  Administered 2016-10-26: 650 mg via RECTAL
  Filled 2016-10-26: qty 1

## 2016-10-26 MED ORDER — IPRATROPIUM-ALBUTEROL 0.5-2.5 (3) MG/3ML IN SOLN: 3.0000 mL | Freq: Four times a day (QID) | RESPIRATORY_TRACT | Status: DC | PRN

## 2016-10-26 MED ORDER — PRAMIPEXOLE DIHYDROCHLORIDE 0.25 MG PO TABS
0.5000 mg | ORAL_TABLET | Freq: Every day | ORAL | Status: DC
  Administered 2016-10-26 – 2016-10-29 (×4): 0.5 mg via ORAL
  Filled 2016-10-26 (×6): qty 2

## 2016-10-26 MED ORDER — FAMOTIDINE IN NACL 20-0.9 MG/50ML-% IV SOLN
20.0000 mg | Freq: Two times a day (BID) | INTRAVENOUS | Status: DC
  Administered 2016-10-26 – 2016-10-27 (×2): 20 mg via INTRAVENOUS
  Filled 2016-10-26 (×2): qty 50

## 2016-10-26 MED ORDER — SODIUM CHLORIDE 0.9 % IV SOLN
Freq: Once | INTRAVENOUS | Status: AC
Start: 2016-10-26 — End: 2016-10-26
  Administered 2016-10-26: 09:00:00 via INTRAVENOUS

## 2016-10-26 MED ORDER — DILTIAZEM HCL 100 MG IV SOLR
5.0000 mg/h | INTRAVENOUS | Status: DC
  Administered 2016-10-26 – 2016-10-27 (×2): 15 mg/h via INTRAVENOUS
  Administered 2016-10-27: 10 mg/h via INTRAVENOUS
  Administered 2016-10-27: 15 mg/h via INTRAVENOUS
  Administered 2016-10-28: 10 mg/h via INTRAVENOUS
  Filled 2016-10-26 (×6): qty 100

## 2016-10-26 MED ORDER — FENTANYL CITRATE (PF) 100 MCG/2ML IJ SOLN: 50.0000 ug | INTRAMUSCULAR | Status: DC | PRN

## 2016-10-26 MED ORDER — METOPROLOL TARTRATE 25 MG PO TABS: 25.0000 mg | ORAL_TABLET | Freq: Two times a day (BID) | ORAL | Status: DC

## 2016-10-26 MED ORDER — VANCOMYCIN HCL IN DEXTROSE 1-5 GM/200ML-% IV SOLN
1000.0000 mg | Freq: Once | INTRAVENOUS | Status: AC
  Administered 2016-10-26: 1000 mg via INTRAVENOUS
  Filled 2016-10-26: qty 200

## 2016-10-26 MED ORDER — FENTANYL CITRATE (PF) 100 MCG/2ML IJ SOLN
25.0000 ug | INTRAMUSCULAR | Status: DC | PRN
  Administered 2016-10-27: 25 ug via INTRAVENOUS
  Administered 2016-10-28 – 2016-10-29 (×4): 50 ug via INTRAVENOUS
  Filled 2016-10-26 (×5): qty 2

## 2016-10-26 MED ORDER — PROPOFOL 1000 MG/100ML IV EMUL
0.0000 ug/kg/min | INTRAVENOUS | Status: DC
  Administered 2016-10-27: 20 ug/kg/min via INTRAVENOUS
  Administered 2016-10-27: 25 ug/kg/min via INTRAVENOUS
  Administered 2016-10-27: 20 ug/kg/min via INTRAVENOUS
  Administered 2016-10-28: 30 ug/kg/min via INTRAVENOUS
  Administered 2016-10-28: 20 ug/kg/min via INTRAVENOUS
  Administered 2016-10-28: 30 ug/kg/min via INTRAVENOUS
  Administered 2016-10-29: 20 ug/kg/min via INTRAVENOUS
  Administered 2016-10-29: 25 ug/kg/min via INTRAVENOUS
  Filled 2016-10-26 (×8): qty 100

## 2016-10-26 MED ORDER — LORAZEPAM 2 MG/ML IJ SOLN
1.0000 mg | Freq: Once | INTRAMUSCULAR | Status: AC
  Administered 2016-10-26: 1 mg via INTRAVENOUS

## 2016-10-26 MED ORDER — SUCCINYLCHOLINE CHLORIDE 20 MG/ML IJ SOLN
INTRAMUSCULAR | Status: DC | PRN
  Administered 2016-10-26: 50 mg via INTRAVENOUS

## 2016-10-26 MED ORDER — SODIUM CHLORIDE 0.9 % IV SOLN: Freq: Once | INTRAVENOUS | Status: DC

## 2016-10-26 MED ORDER — DIGOXIN 0.25 MG/ML IJ SOLN
0.2500 mg | INTRAMUSCULAR | Status: AC
  Administered 2016-10-26: 0.25 mg via INTRAVENOUS
  Filled 2016-10-26: qty 2

## 2016-10-26 MED ORDER — OXYBUTYNIN CHLORIDE 5 MG PO TABS
5.0000 mg | ORAL_TABLET | Freq: Two times a day (BID) | ORAL | Status: DC
  Administered 2016-10-26: 5 mg via ORAL
  Filled 2016-10-26: qty 1

## 2016-10-26 MED ORDER — CHLORHEXIDINE GLUCONATE 0.12% ORAL RINSE (MEDLINE KIT)
15.0000 mL | Freq: Two times a day (BID) | OROMUCOSAL | Status: DC
  Administered 2016-10-27 – 2016-10-30 (×7): 15 mL via OROMUCOSAL
  Filled 2016-10-26: qty 15

## 2016-10-26 MED ORDER — MORPHINE SULFATE (PF) 2 MG/ML IV SOLN
INTRAVENOUS | Status: AC
  Administered 2016-10-26: 1 mg via INTRAVENOUS
  Filled 2016-10-26: qty 1

## 2016-10-26 MED ORDER — PROPOFOL 1000 MG/100ML IV EMUL
INTRAVENOUS | Status: AC
  Filled 2016-10-26: qty 100

## 2016-10-26 MED ORDER — ORAL CARE MOUTH RINSE
15.0000 mL | Freq: Four times a day (QID) | OROMUCOSAL | Status: DC
  Administered 2016-10-26 – 2016-10-30 (×14): 15 mL via OROMUCOSAL

## 2016-10-26 MED ORDER — ETOMIDATE 2 MG/ML IV SOLN
INTRAVENOUS | Status: DC | PRN
  Administered 2016-10-26: 20 mg via INTRAVENOUS

## 2016-10-26 MED ORDER — SIMVASTATIN 40 MG PO TABS
40.0000 mg | ORAL_TABLET | Freq: Every day | ORAL | Status: DC
  Administered 2016-10-26 – 2016-10-27 (×2): 40 mg via ORAL
  Filled 2016-10-26 (×2): qty 1

## 2016-10-26 MED ORDER — HEPARIN (PORCINE) IN NACL 100-0.45 UNIT/ML-% IJ SOLN
950.0000 [IU]/h | Freq: Once | INTRAMUSCULAR | Status: AC
  Administered 2016-10-26: 950 [IU]/h via INTRAVENOUS
  Filled 2016-10-26: qty 250

## 2016-10-26 MED ORDER — ONDANSETRON HCL 4 MG/2ML IJ SOLN
4.0000 mg | Freq: Four times a day (QID) | INTRAMUSCULAR | Status: DC | PRN
Start: 2016-10-26 — End: 2016-10-28

## 2016-10-26 MED ORDER — METHIMAZOLE 10 MG PO TABS
10.0000 mg | ORAL_TABLET | Freq: Every day | ORAL | Status: DC
  Administered 2016-10-26 – 2016-10-29 (×4): 10 mg via ORAL
  Filled 2016-10-26 (×4): qty 1

## 2016-10-26 MED ORDER — PIPERACILLIN-TAZOBACTAM 3.375 G IVPB 30 MIN
3.3750 g | Freq: Once | INTRAVENOUS | Status: AC
  Administered 2016-10-26: 3.375 g via INTRAVENOUS
  Filled 2016-10-26 (×2): qty 50

## 2016-10-26 MED ORDER — MORPHINE SULFATE (PF) 2 MG/ML IV SOLN
1.0000 mg | Freq: Once | INTRAVENOUS | Status: AC
  Administered 2016-10-26: 1 mg via INTRAVENOUS

## 2016-10-26 MED ORDER — DILTIAZEM HCL 25 MG/5ML IV SOLN
10.0000 mg | Freq: Once | INTRAVENOUS | Status: AC
  Administered 2016-10-26: 10 mg via INTRAVENOUS
  Filled 2016-10-26: qty 5

## 2016-10-26 MED ORDER — SODIUM CHLORIDE 0.9 % IV SOLN
Freq: Once | INTRAVENOUS | Status: AC
  Administered 2016-10-26: 08:00:00 via INTRAVENOUS

## 2016-10-26 MED ORDER — HEPARIN (PORCINE) IN NACL 100-0.45 UNIT/ML-% IJ SOLN
950.0000 [IU]/h | INTRAMUSCULAR | Status: DC
  Administered 2016-10-27: 950 [IU]/h via INTRAVENOUS
  Filled 2016-10-26 (×2): qty 250

## 2016-10-26 MED ORDER — VANCOMYCIN HCL IN DEXTROSE 1-5 GM/200ML-% IV SOLN
1000.0000 mg | INTRAVENOUS | Status: DC
  Administered 2016-10-26 – 2016-10-27 (×2): 1000 mg via INTRAVENOUS
  Filled 2016-10-26 (×3): qty 200

## 2016-10-26 MED ORDER — SODIUM CHLORIDE 0.9% FLUSH
3.0000 mL | Freq: Two times a day (BID) | INTRAVENOUS | Status: DC
  Administered 2016-10-26 – 2016-10-29 (×5): 3 mL via INTRAVENOUS

## 2016-10-26 MED ORDER — ONDANSETRON HCL 4 MG PO TABS: 4.0000 mg | ORAL_TABLET | Freq: Four times a day (QID) | ORAL | Status: DC | PRN

## 2016-10-26 MED ORDER — LORAZEPAM 2 MG/ML IJ SOLN
INTRAMUSCULAR | Status: AC
  Administered 2016-10-26: 1 mg via INTRAVENOUS
  Filled 2016-10-26: qty 1

## 2016-10-26 MED ORDER — ACETAMINOPHEN 325 MG PO TABS
650.0000 mg | ORAL_TABLET | Freq: Four times a day (QID) | ORAL | Status: DC | PRN
  Filled 2016-10-26: qty 2

## 2016-10-26 MED ORDER — FENTANYL CITRATE (PF) 100 MCG/2ML IJ SOLN
50.0000 ug | INTRAMUSCULAR | Status: DC | PRN
  Administered 2016-10-26: 50 ug via INTRAVENOUS
  Filled 2016-10-26: qty 2

## 2016-10-26 MED ORDER — LACTATED RINGERS IV SOLN
INTRAVENOUS | Status: DC
  Administered 2016-10-26: 50 mL/h via INTRAVENOUS

## 2016-10-26 MED ORDER — DEXTROSE 5 % IV SOLN
2.0000 g | Freq: Three times a day (TID) | INTRAVENOUS | Status: DC
  Administered 2016-10-26 – 2016-10-27 (×3): 2 g via INTRAVENOUS
  Filled 2016-10-26 (×6): qty 2

## 2016-10-26 MED ORDER — DILTIAZEM HCL 100 MG IV SOLR
5.0000 mg/h | Freq: Once | INTRAVENOUS | Status: AC
  Administered 2016-10-26: 5 mg/h via INTRAVENOUS
  Filled 2016-10-26: qty 100

## 2016-10-26 MED ORDER — IPRATROPIUM-ALBUTEROL 0.5-2.5 (3) MG/3ML IN SOLN
3.0000 mL | Freq: Four times a day (QID) | RESPIRATORY_TRACT | Status: DC
  Administered 2016-10-26 – 2016-10-30 (×16): 3 mL via RESPIRATORY_TRACT
  Filled 2016-10-26 (×16): qty 3

## 2016-10-26 MED ORDER — TERIFLUNOMIDE 14 MG PO TABS: 14.0000 mg | ORAL_TABLET | Freq: Every day | ORAL | Status: DC

## 2016-10-26 NOTE — Progress Notes (Signed)
ANTICOAGULATION CONSULT NOTE - Initial Consult  Pharmacy Consult for Heparin Drip Indication: atrial fibrillation  Allergies  Allergen Reactions  . Other Other (See Comments)    Blood thinners - GI bleed  . Prednisone Other (See Comments)    Causes hair to fall out   . Omeprazole Rash    Patient Measurements: Height: 5\' 3"  (160 cm) Weight: 170 lb (77.1 kg) IBW/kg (Calculated) : 52.4 Heparin Dosing Weight: 69 kg  Vital Signs: Temp: 99.7 F (37.6 C) (04/03 1010) Temp Source: Rectal (04/03 0826) BP: 173/98 (04/03 1010) Pulse Rate: 117 (04/03 1010)  Labs:  Recent Labs  11/01/2016 0758  HGB 11.2*  HCT 36.1  PLT 361  CREATININE 0.82  TROPONINI 0.03*    Estimated Creatinine Clearance: 62.8 mL/min (by C-G formula based on SCr of 0.82 mg/dL).   Medical History: Past Medical History:  Diagnosis Date  . Allergy   . Anxiety   . Arthritis   . GERD (gastroesophageal reflux disease)   . Hypertension   . Movement disorder   . Multiple sclerosis (HCC)   . Neuromuscular disorder (HCC)   . Restless leg syndrome   . Seizures (HCC)   . Thyroid disease   . Vision abnormalities     Assessment: Patient is a 71yo female admitted for respiratory distress/sepsis. Developed afib and ED physician started patient on Heparin drip at 950 units/hr without a bolus. Hospitalist has now consulted pharmacy for dosing.  Goal of Therapy:  Heparin level 0.3-0.7 units/ml Monitor platelets by anticoagulation protocol: Yes   Plan:  Will continue with Heparin 950 units/hr and check Heparin level in 6 hours. Daily CBC will be obtained while on Heparin drip.  Clovia Cuff, PharmD, BCPS 10/29/2016 11:39 AM

## 2016-10-26 NOTE — ED Notes (Addendum)
Propofol ordered for sedation. Pt is hypotensive post intubation. Dr. Sung Amabile verbalized to hang medication and keep the dose low.

## 2016-10-26 NOTE — Progress Notes (Signed)
No ABG needed per DR. Simonds.

## 2016-10-26 NOTE — Sedation Documentation (Signed)
50 mg Fentanyl IV push given.

## 2016-10-26 NOTE — Therapy (Signed)
Called to patient room STAT. Patient found on EMS CPAP in significant respiratory distress, RR high 40's, HR 150, SpO2 86%. Patient very agitated with muscle spasms. Placed on V60 at documented settings. RR decreased to mid 30's, HR 140, SpO2 increased to 98%. Bilat breath sounds revealed wheezes/crackles. ABG done, no critical values.

## 2016-10-26 NOTE — ED Notes (Signed)
This RN called Dois Davenport, RN to update that pt has had a change and will be transported as soon as pt is stabilized or plan has been established.

## 2016-10-26 NOTE — Progress Notes (Signed)
Per Dr Bard Herbert NP will address concerns regarding central line, MOST form, and plan of care.

## 2016-10-26 NOTE — Progress Notes (Addendum)
ANTICOAGULATION CONSULT NOTE - Initial Consult  Pharmacy Consult for Heparin Drip Indication: atrial fibrillation  Allergies  Allergen Reactions  . Other Other (See Comments)    Blood thinners - GI bleed  . Prednisone Other (See Comments)    Causes hair to fall out   . Omeprazole Rash    Patient Measurements: Height: 5\' 3"  (160 cm) Weight: 170 lb (77.1 kg) IBW/kg (Calculated) : 52.4 Heparin Dosing Weight: 69 kg  Vital Signs: Temp: 99.1 F (37.3 C) (04/03 1900) Temp Source: Rectal (04/03 0826) BP: 100/81 (04/03 1900) Pulse Rate: 76 (04/03 1900)  Labs:  Recent Labs  11/06/2016 0758 11/11/2016 1629 11/07/2016 1811  HGB 11.2*  --   --   HCT 36.1  --   --   PLT 361  --   --   HEPARINUNFRC  --  0.31  --   CREATININE 0.82  --   --   TROPONINI 0.03*  --  0.05*    Estimated Creatinine Clearance: 62.8 mL/min (by C-G formula based on SCr of 0.82 mg/dL).   Medical History: Past Medical History:  Diagnosis Date  . Allergy   . Anxiety   . Arthritis   . GERD (gastroesophageal reflux disease)   . Hypertension   . Movement disorder   . Multiple sclerosis (HCC)   . Neuromuscular disorder (HCC)   . Restless leg syndrome   . Seizures (HCC)   . Thyroid disease   . Vision abnormalities     Assessment: Patient is a 71yo female admitted for respiratory distress/sepsis. Developed afib and ED physician started patient on Heparin drip at 950 units/hr without a bolus. Hospitalist has now consulted pharmacy for dosing.  Goal of Therapy:  Heparin level 0.3-0.7 units/ml Monitor platelets by anticoagulation protocol: Yes   Plan:  Will continue with Heparin 950 units/hr and check Heparin level in 6 hours. Daily CBC will be obtained while on Heparin drip.  4/3 1629 HL therapeutic x 1. Continue current rate. Will recheck HL in 6 hours.  4/3 23:00 heparin level 0.55. Continue current regimen. Recheck CBC and heparin level with tomorrow AM labs.  Nathan A. Dahlia Bailiff, PharmD,  BCPS Clinical Pharmacist 11/08/2016 8:14 PM

## 2016-10-26 NOTE — Progress Notes (Signed)
Patients HR was still elevated, RN called me about worsening restlessness. Went in to see that patient is breathing at 40/min and lethargic now Repeat ABG ordered Discussed with Dr. Sung Amabile who has come down and updated husband and plan now is to Intubate to relieve resp distress- short term She will remain a DNR- no resusitation for cardiac arrest Oldest son Merlyn Albert - updated over the phone.driving here now and will reach in an hour

## 2016-10-26 NOTE — ED Notes (Signed)
Hospitalist and intensivist at bedside.

## 2016-10-26 NOTE — ED Notes (Signed)
Vancomycin started first due to no zosyn loaded in pyxis. Pharmacy notified and reports will send ASAP.

## 2016-10-26 NOTE — H&P (Signed)
[facility] [place] [facility]   PATIENT NAME: Brandi Gibbs    MR#:  [id number]  DATE OF BIRTH:  1946-01-21  DATE OF ADMISSION:  11/06/2016  PRIMARY CARE PHYSICIAN: Dorothey Baseman, MD   REQUESTING/REFERRING PHYSICIAN: Dr. Daryel November  CHIEF COMPLAINT:   Chief Complaint  Patient presents with  . Shortness of Breath    HISTORY OF PRESENT ILLNESS:  Brandi Gibbs  is a 71 y.o. female with a known history of Multiple sclerosis, wheelchair-bound, restless leg syndrome, neurogenic bladder, hyperparathyroidism, hypertension, arthritis, reflux disease, history of atrial fibrillation status post cardioversion and not on anticoagulation presents to hospital secondary to sudden onset of shortness of breath and cough that started last night. Patient was hypoxic when she presented to the emergency room, remains on BiPAP at this time. Most of the history is obtained from her husband at her bedside. According to him she has had some dyspnea issues going on for months now. Apparently based on her PCP notes, she was treated for influenza illness in February. She was in her normal state of health up until last evening, then she started to have dry hacking cough which did not subside. It gradually she became very short of breath and ambulance was called. Her sats were in the 60s when she presented to the emergency room. Chest x-ray revealed bibasilar infiltrates. No aspiration history to report dated. However patient has severe multiple sclerosis. No fevers or chills. No nausea, vomiting or abdominal complaints according to husband. Labs here show elevated lactic acid. She was initially hypotensive and blood pressure responded to IV fluids. No history of atrial fibrillation, however heart rate was elevated and difficult to be controlled. Remains on Cardizem drip at this time.  PAST MEDICAL HISTORY:   Past Medical History:  Diagnosis Date  . Allergy   . Anxiety   . Arthritis   .  GERD (gastroesophageal reflux disease)   . Hypertension   . Movement disorder   . Multiple sclerosis (HCC)   . Neuromuscular disorder (HCC)   . Restless leg syndrome   . Seizures (HCC)   . Thyroid disease   . Vision abnormalities     PAST SURGICAL HISTORY:   Past Surgical History:  Procedure Laterality Date  . BACK SURGERY    . PARTIAL HYSTERECTOMY      SOCIAL HISTORY:   Social History  Substance Use Topics  . Smoking status: Former Smoker    Types: Cigarettes  . Smokeless tobacco: Never Used  . Alcohol use No     Comment: rare/fim    FAMILY HISTORY:   Family History  Problem Relation Age of Onset  . Leukemia Mother   . Healthy Father     DRUG ALLERGIES:   Allergies  Allergen Reactions  . Other Other (See Comments)    Blood thinners - GI bleed  . Prednisone Other (See Comments)    Causes hair to fall out   . Omeprazole Rash    REVIEW OF SYSTEMS:   Review of Systems  Unable to perform ROS: Critical illness    MEDICATIONS AT HOME:   Prior to Admission medications   Medication Sig Start Date End Date Taking? Authorizing Provider  acetaminophen (TYLENOL 8 HOUR ARTHRITIS PAIN) 650 MG CR tablet Take 650 mg by mouth 2 (two) times daily.   Yes Historical Provider, MD  acetaminophen (TYLENOL) 325 MG tablet Take 325 mg by mouth 2 (two) times daily as needed.   Yes Historical Provider, MD  baclofen (LIORESAL) 10 MG tablet Take 1 tablet (10 mg total) by mouth 3 (three) times daily. Patient taking differently: Take 10 mg by mouth 3 (three) times daily as needed.  04/28/15  Yes Asa Lente, MD  diltiazem (DILACOR XR) 180 MG 24 hr capsule Take 180 mg by mouth daily.   Yes Historical Provider, MD  lisinopril (PRINIVIL,ZESTRIL) 40 MG tablet Take 40 mg by mouth 2 (two) times daily.    Yes Historical Provider, MD  methimazole (TAPAZOLE) 10 MG tablet Take 1 tablet (10 mg total) by mouth daily. 07/03/15  Yes Adrian Saran, MD  metoprolol tartrate (LOPRESSOR) 25 MG tablet  Take 25 mg by mouth 2 (two) times daily.   Yes Historical Provider, MD  oxybutynin (DITROPAN) 5 MG tablet TAKE 1 TABLET 3 TIMES DAILY Patient taking differently: TAKE 1 TABLET 2 TIMES DAILY 05/20/16  Yes Asa Lente, MD  pramipexole (MIRAPEX) 0.5 MG tablet TAKE 1 TABLET 3 TIMES DAILY Patient taking differently: TAKE 1 TABLET at bedtime 10/23/15  Yes Asa Lente, MD  simvastatin (ZOCOR) 40 MG tablet Take 40 mg by mouth at bedtime.    Yes Historical Provider, MD  Teriflunomide 14 MG TABS Take 14 mg by mouth daily. 07/29/15  Yes Asa Lente, MD  Vitamin D, Ergocalciferol, (DRISDOL) 50000 units CAPS capsule Take 50,000 Units by mouth every 30 (thirty) days.   Yes Historical Provider, MD      VITAL SIGNS:  Blood pressure (!) 173/98, pulse (!) 117, temperature 99.7 F (37.6 C), resp. rate (!) 28, height 5\' 3"  (1.6 m), weight 77.1 kg (170 lb), SpO2 100 %.  PHYSICAL EXAMINATION:   Physical Exam  GENERAL:  71 y.o.-year-old patient lying in the bed with no acute distress.Appears ill, on BiPAP  EYES: Pupils equal, round, reactive to light and accommodation. No scleral icterus. Extraocular muscles intact.  HEENT: Head atraumatic, normocephalic. Oropharynx and nasopharynx clear.  NECK:  Supple, no jugular venous distention. No thyroid enlargement, no tenderness.  LUNGS: Normal breath sounds bilaterally, no wheezing, rales,rhonchi or crepitation. No use of accessory muscles of respiration. Decreased bibasilar breath sounds with scattered rhonchi  CARDIOVASCULAR: S1, S2 normal. No murmurs, rubs, or gallops.  ABDOMEN: Soft, nontender, nondistended. Bowel sounds present. No organomegaly or mass.  EXTREMITIES: No pedal edema, cyanosis, or clubbing.  NEUROLOGIC: Cranial nerves II through XII are intact. Muscle strength 5/5 in all extremities. However spasms and restless movements of lower extremities noted. Sensation intact except decreased in lower extremities until the knees. Gait not checked.    PSYCHIATRIC: The patient is alert and oriented x 1-2, restless and ill appearing.  SKIN: No obvious rash, lesion, or ulcer.   LABORATORY PANEL:   CBC  Recent Labs Lab 11-17-2016 0758  WBC 13.2*  HGB 11.2*  HCT 36.1  PLT 361   ------------------------------------------------------------------------------------------------------------------  Chemistries   Recent Labs Lab 17-Nov-2016 0758  NA 139  K 3.9  CL 106  CO2 21*  GLUCOSE 178*  BUN 17  CREATININE 0.82  CALCIUM 8.9  AST 36  ALT 26  ALKPHOS 121  BILITOT 0.9   ------------------------------------------------------------------------------------------------------------------  Cardiac Enzymes  Recent Labs Lab 2016-11-17 0758  TROPONINI 0.03*   ------------------------------------------------------------------------------------------------------------------  RADIOLOGY:  Dg Chest Port 1 View  Result Date: 11-17-2016 CLINICAL DATA:  Sepsis EXAM: PORTABLE CHEST 1 VIEW COMPARISON:  06/28/2016 FINDINGS: 0833 hours. Asymmetric bilateral airspace disease noted, right greater than left. The cardio pericardial silhouette is enlarged. Possible tiny bilateral pleural effusions. Degenerative changes noted left  shoulder. Telemetry leads overlie the chest. IMPRESSION: Patchy asymmetric airspace disease, right greater than left. Diffuse pneumonia favored although asymmetric edema could have this appearance. Electronically Signed   By: Kennith Center M.D.   On: 11/11/2016 08:47    EKG:   Orders placed or performed during the hospital encounter of 10/29/2016  . ED EKG 12-Lead  . ED EKG 12-Lead  . EKG 12-Lead  . EKG 12-Lead    IMPRESSION AND PLAN:   Shiloe Blancett  is a 71 y.o. female with a known history of Multiple sclerosis, wheelchair-bound, restless leg syndrome, neurogenic bladder, hyperparathyroidism, hypertension, arthritis, reflux disease, history of atrial fibrillation status post cardioversion and not on anticoagulation  presents to hospital secondary to sudden onset of shortness of breath and cough that started last night.  #1 acute hypoxic respiratory failure-secondary to bibasilar pneumonia. Not on home oxygen. -Currently on BiPAP, been as tolerated. Intensivist consult it. -On IV antibiotics. Dual nebs when necessary ordered. -Will be admitted to stepdown  #2 sepsis- secondary to bibasilar pneumonia. Follow blood cultures. -Started on vancomycin and cefepime at this time. Check MRSA PCR. -Narrow antibiotics once cultures are negative  #3 atrial fibrillation with rapid ventricular response-known history of chronic atrial fibrillation status post cardioversion. -Likely triggered by respiratory failure. -Continue Cardizem drip. Started oral metoprolol. Echocardiogram has been ordered. -On heparin drip for anticoagulation. Also cardiology consulted.  #4 hypertension-blood pressure responded well with IV fluids. On metoprolol orally and also Cardizem drip now. Continue to hold lisinopril  #5 multiple sclerosis with restless leg syndrome-continue home medications. Patient on maintenance Aubagio, also Mirapex for her restless legs. Follows with neurology as outpatient Her main deficits are muscle spasms in lower extremities and also gait issues  #6 DVT prophylaxis-on heparin drip at this time.    All the records are reviewed and case discussed with ED provider. Management plans discussed with the patient, family and they are in agreement.  CODE STATUS: DO NOT RESUSCITATE  TOTAL CRITICAL CARE TIME  SPENT IN TAKING CARE OF THIS PATIENT: 65 minutes.    Enid Baas M.D on 11/12/2016 at 10:57 AM  Between 7am to 6pm - Pager - [phone]  After 6pm go to [email or web address] - Social research officer, government  [facility] [place] [facility]  Office  [phone]  CC: Primary care physician; Dorothey Baseman, MD

## 2016-10-26 NOTE — ED Provider Notes (Addendum)
[facility] Emergency Department Provider Note       Time seen: ----------------------------------------- 8:01 AM on 10/29/2016 -----------------------------------------   L5 caveat: Review of systems and history is limited by respiratory distress  I have reviewed the triage vital signs and the nursing notes.   HISTORY   Chief Complaint Shortness of Breath    HPI Brandi Gibbs is a 71 y.o. female who presents to the ED for tarry distress. EMS arrived to find her with difficulty breathing. She was very hypoxic upon initial evaluation was placed on CPAP. She arrives here and continued respiratory distress. She states oxygen is helping but she remains very dyspneic. No further information or history is available at this time.   Past Medical History:  Diagnosis Date  . Allergy   . Anxiety   . Arthritis   . GERD (gastroesophageal reflux disease)   . Hypertension   . Movement disorder   . Multiple sclerosis (HCC)   . Neuromuscular disorder (HCC)   . Restless leg syndrome   . Seizures (HCC)   . Thyroid disease   . Vision abnormalities     Patient Active Problem List   Diagnosis Date Noted  . Vitamin D deficiency 02/25/2016  . Right shoulder pain 12/24/2014  . Arthralgia of shoulder 12/24/2014  . Altered mental state 12/06/2014  . Abnormal mental state 12/06/2014  . Ataxic gait 09/11/2014  . Cognitive changes 09/11/2014  . Other fatigue 09/11/2014  . Urinary hesitancy 09/11/2014  . Spasticity 09/11/2014  . Midline low back pain without sciatica 09/11/2014  . Insomnia 09/11/2014  . Restless leg 09/11/2014  . Snoring 09/11/2014  . Abnormal involuntary movement 09/11/2014  . Abnormal gait 09/11/2014  . Abnormal respiratory rate 09/11/2014  . Other symptoms and signs involving cognitive functions and awareness 09/11/2014  . LBP (low back pain) 09/11/2014  . Does not feel right 09/11/2014  . DS (disseminated sclerosis) (HCC) 03/28/2014  .  Osteopenia 03/28/2014  . Hyperthyroidism 03/28/2014  . BP (high blood pressure) 03/28/2014  . HLD (hyperlipidemia) 03/28/2014  . Essential (primary) hypertension 03/28/2014  . 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 Multiple sclerosis (HCC) 03/28/2014    Past Surgical History:  Procedure Laterality Date  . BACK SURGERY    . PARTIAL HYSTERECTOMY      Allergies Other; Prednisone; and Omeprazole  Social History Social History  Substance Use Topics  . Smoking status: Former Smoker    Types: Cigarettes  . Smokeless tobacco: Never Used  . Alcohol use No     Comment: rare/fim    Review of Systems Positive for respiratory distress  ____________________________________________   PHYSICAL EXAM:  VITAL SIGNS: ED Triage Vitals  Enc Vitals Group     BP      Pulse      Resp      Temp      Temp src      SpO2      Weight      Height      Head Circumference      Peak Flow      Pain Score      Pain Loc      Pain Edu?      Excl. in GC?     Constitutional: Alert, in Moderate to severe respiratory distress Eyes: Conjunctivae are normal. PERRL. Normal extraocular movements. ENT   Head: Normocephalic and atraumatic.   Nose: No congestion/rhinnorhea.   Mouth/Throat: Mucous membranes are moist.   Neck: No stridor. Cardiovascular:  Normal rate, regular rhythm. No murmurs, rubs, or gallops. Respiratory: Tachypnea with scattered rhonchi bilaterally Gastrointestinal: Soft and nontender. Normal bowel sounds Musculoskeletal: Nontender with normal range of motion in extremities. No lower extremity tenderness nor edema. Neurologic:  No gross focal neurologic deficits are appreciated.  Skin:  Skin is warm, dry and intact. No rash noted. Psychiatric: Mood and affect are normal.  ____________________________________________  EKG: Interpreted by me. A 2 fibrillation with a rapid ventricular response, rate is 150 bpm, normal QRS size, long QT,  incomplete left bundle branch block, ST depressions are noted  ____________________________________________  ED COURSE:  Pertinent labs & imaging results that were available during my care of the patient were reviewed by me and considered in my medical decision making (see chart for details). Patient presents for respiratory distress, we will assess with labs and imaging as indicated. Possible aspiration pneumonia, family notes recently she has had increased trouble swallowing.   Procedures ____________________________________________   LABS (pertinent positives/negatives)  Labs Reviewed  LACTIC ACID, PLASMA - Abnormal; Notable for the following:       Result Value   Lactic Acid, Venous 3.0 (*)    All other components within normal limits  COMPREHENSIVE METABOLIC PANEL - Abnormal; Notable for the following:    CO2 21 (*)    Glucose, Bld 178 (*)    All other components within normal limits  TROPONIN I - Abnormal; Notable for the following:    Troponin I 0.03 (*)    All other components within normal limits  CBC WITH DIFFERENTIAL/PLATELET - Abnormal; Notable for the following:    WBC 13.2 (*)    Hemoglobin 11.2 (*)    MCV 74.3 (*)    MCH 23.0 (*)    MCHC 31.0 (*)    RDW 18.6 (*)    Neutro Abs 11.7 (*)    Lymphs Abs 0.7 (*)    All other components within normal limits  BLOOD GAS, ARTERIAL - Abnormal; Notable for the following:    pO2, Arterial 158 (*)    Acid-base deficit 2.4 (*)    All other components within normal limits  URINALYSIS, COMPLETE (UACMP) WITH MICROSCOPIC - Abnormal; Notable for the following:    Color, Urine YELLOW (*)    APPearance HAZY (*)    Hgb urine dipstick SMALL (*)    Ketones, ur 20 (*)    Protein, ur 100 (*)    Squamous Epithelial / LPF 0-5 (*)    All other components within normal limits  BRAIN NATRIURETIC PEPTIDE - Abnormal; Notable for the following:    B Natriuretic Peptide 779.0 (*)    All other components within normal limits  CULTURE,  BLOOD (ROUTINE X 2)  CULTURE, BLOOD (ROUTINE X 2)  URINE CULTURE  LACTIC ACID, PLASMA  INFLUENZA PANEL BY PCR (TYPE A & B)   CRITICAL CARE Performed by: Emily Filbert   Total critical care time: 30 minutes  Critical care time was exclusive of separately billable procedures and treating other patients.  Critical care was necessary to treat or prevent imminent or life-threatening deterioration.  Critical care was time spent personally by me on the following activities: development of treatment plan with patient and/or surrogate as well as nursing, discussions with consultants, evaluation of patient's response to treatment, examination of patient, obtaining history from patient or surrogate, ordering and performing treatments and interventions, ordering and review of laboratory studies, ordering and review of radiographic studies, pulse oximetry and re-evaluation of patient's condition.  RADIOLOGY Images  were viewed by me  Chest x-ray  IMPRESSION: Patchy asymmetric airspace disease, right greater than left. Diffuse pneumonia favored although asymmetric edema could have this appearance. ____________________________________________  FINAL ASSESSMENT AND PLAN  Acute respiratory failure with hypoxia, rapid atrial fibrillation, sepsis  Plan: Patient's labs and imaging were dictated above. Patient had presented for acute respiratory distress and failure and is on BIPAP. This appears to be a component of likely pneumonia with superimposed congestive heart failure. Initially she seemed to be hypotensive and now she has responded to fluids and is actually somewhat hypertensive. We will need to proceed cautiously, she has received IV fluids and broad-spectrum antibiotics. We will give her low-dose Cardizem for rate control and she will likely need echocardiogram. She will need repeat lactic acid levels to ensure clearing. I will discuss with the hospitalist for stepdown  admission.   Emily Filbert, MD   Note: This note was generated in part or whole with voice recognition software. Voice recognition is usually quite accurate but there are transcription errors that can and very often do occur. I apologize for any typographical errors that were not detected and corrected.     Emily Filbert, MD 11/02/2016 [id number]    Emily Filbert, MD 11/17/2016 [phone]

## 2016-10-26 NOTE — ED Triage Notes (Signed)
Pt ems from home for SOB. Ems recorded room air sats 60's. Ems gave albuterol and duoneb nebulizer, 125mg  solumedrol and 1" nitro paste. Pt arrived on cpap.

## 2016-10-26 NOTE — Consult Note (Signed)
PULMONARY / CRITICAL CARE MEDICINE   Name: Brandi Gibbs MRN: [id number] DOB: Jul 28, 1945    ADMISSION DATE:  11/04/2016 CONSULTATION DATE:  04/03  REFERRING MD: Nemiah Commander  PT PROFILE: 71 y.o. female former smoker with advanced MS, chronic AF admitted via ED with acute respiratory distress of approx 12 hrs duration, cough, hypoxemia and initial CXR with RLL infiltrate. Failed BiPAP in ED and intubated. Pt has Advanced Directives and does not wish to undergo prolonged ventilation but husband opted for short term intubation if there is something reversible. Post-intubation CXR has appearance of pulmonary edema versus acute lung injury  MAJOR EVENTS/TEST RESULTS: 04/03 Admitted via ED. Intubated after failed BiPAP attempt   INDWELLING DEVICES:: ETT 04/03 >>   MICRO DATA: MRSA PCR 04/03 >>  Urine 04/03 >>  Resp 04/03 >>  Blood 04/03 >>   ANTIMICROBIALS:  Vanc 04/03 >>  Cefepime 04/03 >>    HISTORY OF PRESENT ILLNESS:   At the time of my evaluation, the patient was encephalopathic and unable to provide further history. Her husband was extremely distraught and likewise could not provide much history. The records from the emergency department and Dr. Prudencio Pair admission note have been reviewed  PAST MEDICAL HISTORY :  She  has a past medical history of Allergy; Anxiety; Arthritis; GERD (gastroesophageal reflux disease); Hypertension; Movement disorder; Multiple sclerosis (HCC); Neuromuscular disorder (HCC); Restless leg syndrome; Seizures (HCC); Thyroid disease; and Vision abnormalities.  PAST SURGICAL HISTORY: She  has a past surgical history that includes Partial hysterectomy and Back surgery.  Allergies  Allergen Reactions  . Other Other (See Comments)    Blood thinners - GI bleed  . Prednisone Other (See Comments)    Causes hair to fall out   . Omeprazole Rash    No current facility-administered medications on file prior to encounter.    Current Outpatient  Prescriptions on File Prior to Encounter  Medication Sig  . acetaminophen (TYLENOL 8 HOUR ARTHRITIS PAIN) 650 MG CR tablet Take 650 mg by mouth 2 (two) times daily.  Marland Kitchen acetaminophen (TYLENOL) 325 MG tablet Take 325 mg by mouth 2 (two) times daily as needed.  . baclofen (LIORESAL) 10 MG tablet Take 1 tablet (10 mg total) by mouth 3 (three) times daily. (Patient taking differently: Take 10 mg by mouth 3 (three) times daily as needed. )  . diltiazem (DILACOR XR) 180 MG 24 hr capsule Take 180 mg by mouth daily.  Marland Kitchen lisinopril (PRINIVIL,ZESTRIL) 40 MG tablet Take 40 mg by mouth 2 (two) times daily.   . methimazole (TAPAZOLE) 10 MG tablet Take 1 tablet (10 mg total) by mouth daily.  . metoprolol tartrate (LOPRESSOR) 25 MG tablet Take 25 mg by mouth 2 (two) times daily.  Marland Kitchen oxybutynin (DITROPAN) 5 MG tablet TAKE 1 TABLET 3 TIMES DAILY (Patient taking differently: TAKE 1 TABLET 2 TIMES DAILY)  . pramipexole (MIRAPEX) 0.5 MG tablet TAKE 1 TABLET 3 TIMES DAILY (Patient taking differently: TAKE 1 TABLET at bedtime)  . simvastatin (ZOCOR) 40 MG tablet Take 40 mg by mouth at bedtime.   . Teriflunomide 14 MG TABS Take 14 mg by mouth daily.  . Vitamin D, Ergocalciferol, (DRISDOL) 50000 units CAPS capsule Take 50,000 Units by mouth every 30 (thirty) days.    FAMILY HISTORY:  Her indicated that her mother is deceased. She indicated that her father is deceased.    SOCIAL HISTORY: She  reports that she has quit smoking. Her smoking use included Cigarettes. She has never used smokeless  tobacco. She reports that she does not drink alcohol or use drugs.  REVIEW OF SYSTEMS:   Level V caveat  SUBJECTIVE:    VITAL SIGNS: BP (!) 89/56   Pulse (!) 47   Temp 100 F (37.8 C)   Resp (!) 23   Ht 5\' 3"  (1.6 m)   Wt 170 lb (77.1 kg)   SpO2 96%   BMI 30.11 kg/m   HEMODYNAMICS:    VENTILATOR SETTINGS: Vent Mode: PRVC FiO2 (%):  [80 %] 80 % Set Rate:  [15 bmp-16 bmp] 15 bmp Vt Set:  [450 mL] 450 mL PEEP:   [5 cmH20] 5 cmH20  INTAKE / OUTPUT: No intake/output data recorded.  PHYSICAL EXAMINATION: General: Chronically ill-appearing, severe respiratory distress prior to intubation. Neuro: Cranial nerves grossly intact, moves all extremities, diminished strength throughout, DTRs symmetric HEENT: NCAT, sclerae white Cardiovascular: Tachycardia,IR IR, no murmurs noted Lungs: Diffuse rhonchi, no wheezes Abdomen: Soft, nontender, diminished bowel sounds Extremities: Warm without edema Skin: No lesions noted  LABS:  BMET  Recent Labs Lab 10/29/2016 0758  NA 139  K 3.9  CL 106  CO2 21*  BUN 17  CREATININE 0.82  GLUCOSE 178*    Electrolytes  Recent Labs Lab 10/31/2016 0758  CALCIUM 8.9    CBC  Recent Labs Lab 11/15/2016 0758  WBC 13.2*  HGB 11.2*  HCT 36.1  PLT 361    Coag's No results for input(s): APTT, INR in the last 168 hours.  Sepsis Markers  Recent Labs Lab 11/15/2016 0758 11/19/2016 1100  LATICACIDVEN 3.0* 2.1*    ABG  Recent Labs Lab 11/21/2016 0758 11/05/2016 1554  PHART 7.43 7.44  PCO2ART 32 30*  PO2ART 158* 291*    Liver Enzymes  Recent Labs Lab 10/31/2016 0758  AST 36  ALT 26  ALKPHOS 121  BILITOT 0.9  ALBUMIN 3.7    Cardiac Enzymes  Recent Labs Lab 11/07/2016 0758  TROPONINI 0.03*    Glucose No results for input(s): GLUCAP in the last 168 hours.  CXR: Initial chest x-ray revealed focal right lower lobe opacification. Subsequent chest x-ray more consistent with pulmonary edema   ASSESSMENT / PLAN:  PULMONARY A: Acute hypoxemic respiratory failure Suspect right lower lobe pneumonia - high risk of aspiration pneumonia P:   Vent settings established Vent bundle implemented Daily SBT as indicated Nebulized bronchodilators  CARDIOVASCULAR A:  Chronic atrial fibrillation with RVR Borderline hypotension P:  Heparin drip ordered Diltiazem drip ordered DNR in event of cardiac arrest - see below  RENAL A:   No acute  issues P:   Monitor BMET intermittently Monitor I/Os Correct electrolytes as indicated  GASTROINTESTINAL A:   No acute issues P:   SUP: IV famotidine Consider TFs 04/04  HEMATOLOGIC A:   Mild chronic anemia without acute blood loss P:  DVT px: SQ heparin Monitor CBC intermittently Transfuse per usual guidelines  INFECTIOUS A:   RLL PNA - suspect aspiration P:   Monitor temp, WBC count Micro and abx as above  ENDOCRINE A:   Hyperglycemia without prior diagnosis of diabetes P:   Sliding scale insulin ordered - sensitive scale  NEUROLOGIC A:   Advanced MS Acute encephalopathy ICU/ventilator associated discomfort P:   RASS goal: -2, -3 PAD protocol   FAMILY  Prior to intubation, I spoke with the patient's husband discussing goals of care. The patient has previously specified that she wished to forego prolonged mechanical ventilation and ACLS. However, she did not specifically address short-term intubation for  a reversible condition. The husband wishes for [place] to support her on mechanical ventilation if necessary and if we think her current condition might be reversible. At the time of my initial evaluation she was unacceptably uncomfortable despite BiPAP. Therefore we elected to proceed with intubation. However, she is DNR in the event of cardiac arrest and we will not escalate care significantly to include such interventions as dialysis   CCM time: 45 mins  The above time includes time spent in consultation with patient and/or family members and discussing the care plan with Dr. Jack Quarto, MD PCCM service Mobile [phone] Pager [phone]    2016/11/03, 5:50 PM

## 2016-10-26 NOTE — Procedures (Signed)
Oral Intubation Procedure Note  Indications: Respiratory insufficiency Consent: Unable to obtain consent because of altered level of consciousness. Time Out: Verified patient identification, verified procedure, site/side was marked, verified correct patient position, special equipment/implants available, medications/allergies/relevent history reviewed, required imaging and test results available.   Pre-meds: Etomidate 20 mg IV  Neuromuscular blockade: Rocuronium 50 mg IV  Laryngoscope: #3 MAC  Visualization: cords fully visualized  ETT: 8.0 ETT passed on first attempt and secured @ 24 cm at upper incisors  Findings: normal airway   Evaluation:  Tube position confirmed by auscultation and EZCap CXR pending  Pt tolerated procedure well without complications   Merton Border, MD PCCM service Mobile [phone] Pager [phone] 11/09/2016

## 2016-10-26 NOTE — ED Notes (Addendum)
Pt attempted to swallow PO tylenol after reporting to the nurse that she could swallow. Pt had pill in mouth and straw in mouth and would not drink water. Pt was asked to drink and stated "I can drink" but never closed mouth around straw to take a sip. Pill removed from mouth and suppository tylenol will be obtained. Pt was off bipap for approx 5 minutes for this and oxygen saturation dropped from 100% to 93%. Pt verbalized decreased WOB and "feeling better" but appeared to RN to have continued increase in WOB and SOB. Bipap mask placed back on pt.

## 2016-10-26 NOTE — ED Notes (Signed)
Pt continues to kick and have increasing restlessness. MD made aware that pt is unable to swallow home medications and therefore unable to taken medication for restless legs. MD verbalized ativan could be administered to pt.

## 2016-10-26 NOTE — ED Notes (Signed)
CODE SEPSIS CALLED TO DOUG AT [facility] 

## 2016-10-26 NOTE — Therapy (Signed)
ABG results hand-delivered to Dr.Simons. Order received for intubation due to deteriorating status. Increased WOB with tachypnea, RR 48, patient with elevated temp. Patient orally intubated by Dr.Simons with #8.0 on first attempt. Bilateral breath sounds appreciated, positive color change on CO2 detector. ETCO2 monitor in place. CXR ordered and completed, results pending. Patient placed on vent at documented settings. Transfer to CCU pending

## 2016-10-26 NOTE — Progress Notes (Signed)
Called [facility] and spoke with Dr Levada Schilling regarding patients lack of access with what we are running and what is ordered. Discussed as well no orders for NG/ OG. Dr Chevis Pretty patients PCP droped off a MOST form and her telephone number. At this point Dr Levada Schilling will contact Dr Bard Herbert to discuss plan of care. No orders at this point.

## 2016-10-26 NOTE — Progress Notes (Addendum)
   [facility] [place] at [facility] Day: 0 days Brandi Gibbs is a 71 y.o. female presenting with Shortness of Breath .   Advance care planning discussed with patient  And her  huaband at bedside. All questions in regards to overall condition and expected prognosis answered. Patient is not very alert and oriented at this tinme due to her acute illness. There has been documentation from her PCP notes that patient is a DNR. Confirmed with her husband at bedside, however he wants to try short term intubation if it helps?. The decision was made to continue current code status  CODE STATUS:DNR Time spent: 18 minutes

## 2016-10-26 NOTE — Progress Notes (Signed)
Pharmacy Antibiotic Note  Brandi Gibbs is a 71 y.o. female admitted on 11/16/2016 with sepsis and respiratory distress.  Pharmacy has been consulted for Vancomycin and Cefepime dosing. Received Vancomycin 1g IV and Zosyn 3.375g IV in the ED.  Plan: Will order Vancomycin 1g IV q18h to begin 10 hours after dose in the ED for stacked dosing. Will check a trough level prior to 5th dose. Goal trough range is 15 - 69mcg/ml. MRSA PCR has been ordered. Will order Cefepime 2g IV q8h  Height: 5\' 3"  (160 cm) Weight: 170 lb (77.1 kg) IBW/kg (Calculated) : 52.4  Temp (24hrs), Avg:99.2 F (37.3 C), Min:98.3 F (36.8 C), Max:99.7 F (37.6 C)   Recent Labs Lab 11/16/2016 0758  WBC 13.2*  CREATININE 0.82  LATICACIDVEN 3.0*    Estimated Creatinine Clearance: 62.8 mL/min (by C-G formula based on SCr of 0.82 mg/dL).    Allergies  Allergen Reactions  . Other Other (See Comments)    Blood thinners - GI bleed  . Prednisone Other (See Comments)    Causes hair to fall out   . Omeprazole Rash    Antimicrobials this admission: Zosyn  4/3 x 1 dose Vancomycin 4/3 >>  Cefepime 4/3 >>  Thank you for allowing pharmacy to be a part of this patient's care.  Clovia Cuff, PharmD, BCPS 11-16-2016 11:32 AM

## 2016-10-26 NOTE — Sedation Documentation (Addendum)
50mg Rocuronium given

## 2016-10-27 ENCOUNTER — Inpatient Hospital Stay: Payer: Medicare (Managed Care)

## 2016-10-27 ENCOUNTER — Encounter: Payer: Self-pay | Admitting: Physician Assistant

## 2016-10-27 DIAGNOSIS — Z7189 Other specified counseling: Secondary | ICD-10-CM

## 2016-10-27 DIAGNOSIS — A419 Sepsis, unspecified organism: Secondary | ICD-10-CM | POA: Diagnosis present

## 2016-10-27 DIAGNOSIS — R7989 Other specified abnormal findings of blood chemistry: Secondary | ICD-10-CM

## 2016-10-27 DIAGNOSIS — D72829 Elevated white blood cell count, unspecified: Secondary | ICD-10-CM

## 2016-10-27 DIAGNOSIS — J9600 Acute respiratory failure, unspecified whether with hypoxia or hypercapnia: Secondary | ICD-10-CM

## 2016-10-27 DIAGNOSIS — Z515 Encounter for palliative care: Secondary | ICD-10-CM

## 2016-10-27 DIAGNOSIS — R6521 Severe sepsis with septic shock: Secondary | ICD-10-CM

## 2016-10-27 DIAGNOSIS — N179 Acute kidney failure, unspecified: Secondary | ICD-10-CM

## 2016-10-27 DIAGNOSIS — R778 Other specified abnormalities of plasma proteins: Secondary | ICD-10-CM

## 2016-10-27 DIAGNOSIS — J181 Lobar pneumonia, unspecified organism: Secondary | ICD-10-CM

## 2016-10-27 DIAGNOSIS — I480 Paroxysmal atrial fibrillation: Secondary | ICD-10-CM | POA: Diagnosis present

## 2016-10-27 DIAGNOSIS — I4891 Unspecified atrial fibrillation: Secondary | ICD-10-CM | POA: Diagnosis present

## 2016-10-27 DIAGNOSIS — R4189 Other symptoms and signs involving cognitive functions and awareness: Secondary | ICD-10-CM

## 2016-10-27 DIAGNOSIS — J189 Pneumonia, unspecified organism: Secondary | ICD-10-CM | POA: Diagnosis present

## 2016-10-27 HISTORY — DX: Acute kidney failure, unspecified: N17.9

## 2016-10-27 LAB — CBC
HCT: 31.4 % — ABNORMAL LOW (ref 35.0–47.0)
HEMOGLOBIN: 9.8 g/dL — AB (ref 12.0–16.0)
MCH: 23.1 pg — ABNORMAL LOW (ref 26.0–34.0)
MCHC: 31.3 g/dL — AB (ref 32.0–36.0)
MCV: 74 fL — ABNORMAL LOW (ref 80.0–100.0)
PLATELETS: 273 10*3/uL (ref 150–440)
RBC: 4.24 MIL/uL (ref 3.80–5.20)
RDW: 18.8 % — AB (ref 11.5–14.5)
WBC: 11.3 10*3/uL — ABNORMAL HIGH (ref 3.6–11.0)

## 2016-10-27 LAB — ECHOCARDIOGRAM COMPLETE
Height: 63 in
Weight: 2720 oz

## 2016-10-27 LAB — PHOSPHORUS
Phosphorus: 2.3 mg/dL — ABNORMAL LOW (ref 2.5–4.6)
Phosphorus: 2.4 mg/dL — ABNORMAL LOW (ref 2.5–4.6)

## 2016-10-27 LAB — BASIC METABOLIC PANEL
ANION GAP: 11 (ref 5–15)
BUN: 28 mg/dL — AB (ref 6–20)
CALCIUM: 8.6 mg/dL — AB (ref 8.9–10.3)
CO2: 20 mmol/L — ABNORMAL LOW (ref 22–32)
CREATININE: 1.14 mg/dL — AB (ref 0.44–1.00)
Chloride: 107 mmol/L (ref 101–111)
GFR, EST AFRICAN AMERICAN: 55 mL/min — AB (ref >60)
GFR, EST NON AFRICAN AMERICAN: 48 mL/min — AB (ref >60)
Glucose, Bld: 224 mg/dL — ABNORMAL HIGH (ref 65–99)
Potassium: 3.6 mmol/L (ref 3.5–5.1)
Sodium: 138 mmol/L (ref 135–145)

## 2016-10-27 LAB — MAGNESIUM
MAGNESIUM: 1.5 mg/dL — AB (ref 1.7–2.4)
Magnesium: 1.6 mg/dL — ABNORMAL LOW (ref 1.7–2.4)
Magnesium: 3.3 mg/dL — ABNORMAL HIGH (ref 1.7–2.4)

## 2016-10-27 LAB — GLUCOSE, CAPILLARY
GLUCOSE-CAPILLARY: 162 mg/dL — AB (ref 65–99)
GLUCOSE-CAPILLARY: 158 mg/dL — AB (ref 65–99)
Glucose-Capillary: 116 mg/dL — ABNORMAL HIGH (ref 65–99)
Glucose-Capillary: 132 mg/dL — ABNORMAL HIGH (ref 65–99)

## 2016-10-27 LAB — TROPONIN I: TROPONIN I: 0.04 ng/mL — AB (ref <0.03)

## 2016-10-27 LAB — PROCALCITONIN

## 2016-10-27 LAB — URINE CULTURE: Culture: NO GROWTH

## 2016-10-27 LAB — HEPARIN LEVEL (UNFRACTIONATED): Heparin Unfractionated: 0.53 IU/mL (ref 0.30–0.70)

## 2016-10-27 MED ORDER — FAMOTIDINE 20 MG PO TABS
20.0000 mg | ORAL_TABLET | Freq: Every day | ORAL | Status: DC
  Administered 2016-10-28 – 2016-10-29 (×2): 20 mg
  Filled 2016-10-27 (×2): qty 1

## 2016-10-27 MED ORDER — INSULIN ASPART 100 UNIT/ML ~~LOC~~ SOLN
0.0000 [IU] | SUBCUTANEOUS | Status: DC
  Administered 2016-10-27 (×2): 3 [IU] via SUBCUTANEOUS
  Administered 2016-10-27 – 2016-10-29 (×6): 2 [IU] via SUBCUTANEOUS
  Administered 2016-10-29 – 2016-10-30 (×2): 3 [IU] via SUBCUTANEOUS
  Administered 2016-10-30 (×2): 2 [IU] via SUBCUTANEOUS
  Filled 2016-10-27 (×5): qty 2
  Filled 2016-10-27: qty 3
  Filled 2016-10-27 (×3): qty 2
  Filled 2016-10-27 (×3): qty 3

## 2016-10-27 MED ORDER — PRO-STAT SUGAR FREE PO LIQD: 30.0000 mL | Freq: Two times a day (BID) | ORAL | Status: DC

## 2016-10-27 MED ORDER — DEXTROSE 5 % IV SOLN
2.0000 g | INTRAVENOUS | Status: DC
  Administered 2016-10-28 – 2016-10-30 (×3): 2 g via INTRAVENOUS
  Filled 2016-10-27 (×4): qty 2

## 2016-10-27 MED ORDER — MAGNESIUM SULFATE 4 GM/100ML IV SOLN
4.0000 g | Freq: Once | INTRAVENOUS | Status: AC
  Administered 2016-10-27: 4 g via INTRAVENOUS
  Filled 2016-10-27: qty 100

## 2016-10-27 MED ORDER — VANCOMYCIN HCL IN DEXTROSE 1-5 GM/200ML-% IV SOLN
1000.0000 mg | INTRAVENOUS | Status: DC
  Filled 2016-10-27: qty 200

## 2016-10-27 MED ORDER — VITAL HIGH PROTEIN PO LIQD
1000.0000 mL | ORAL | Status: DC
Start: 2016-10-27 — End: 2016-10-27

## 2016-10-27 NOTE — Progress Notes (Signed)
PT Cancellation Note  Patient Details Name: OCTAVIA VELADOR MRN: [id number] DOB: [birth year]   Cancelled Treatment:    Reason Eval/Treat Not Completed: Medical issues which prohibited therapy (Consult received and chart reviewed.  Patient with decline in respiratory status since admission; now intubated and sedated.  Will discontinue PT order at this time; please re-consult as medically appropriate and patient able to tolerate/participate.)   Jerrian Mells H. Manson Passey, PT, DPT, NCS 10/27/16, 1:27 PM [phone]

## 2016-10-27 NOTE — Progress Notes (Signed)
Per Palliative Care family has decided to withdrawal care tomorrow at 9:30. Orders to continue cardizem, heparin, and propofol. No new interventions to be performed.

## 2016-10-27 NOTE — Progress Notes (Signed)
[facility] [place] at [facility]   PATIENT NAME: Brandi Gibbs    MR#:  [id number]  DATE OF BIRTH:  [birth year]  SUBJECTIVE:   Patient came in with increasing shortness of breath and was on BiPAP. Chest x-ray revealed bilateral basilar infiltrates. She got intubated and on the vent. Family in the room REVIEW OF SYSTEMS:   Review of Systems  Unable to perform ROS: Intubated   Tolerating Diet:intubated Tolerating PT: intubated  DRUG ALLERGIES:   Allergies  Allergen Reactions  . Other Other (See Comments)    Blood thinners - GI bleed  . Prednisone Other (See Comments)    Causes hair to fall out   . Omeprazole Rash    VITALS:  Blood pressure 133/90, pulse (!) 113, temperature 98.1 F (36.7 C), resp. rate (!) 22, height 5\' 3"  (1.6 m), weight 79.5 kg (175 lb 4.3 oz), SpO2 99 %.  PHYSICAL EXAMINATION:   Physical Exam  GENERAL:  71 y.o.-year-old patient lying in the bed with no acute distress. Critically ill EYES: Pupils equal, round, reactive to light and accommodation. No scleral icterus. Extraocular muscles intact.  HEENT: Head atraumatic, normocephalic. Oropharynx and nasopharynx clear. Intubated and on the vent NECK:  Supple, no jugular venous distention. No thyroid enlargement, no tenderness.  LUNGS: Normal breath sounds bilaterally, no wheezing, rales, rhonchi. No use of accessory muscles of respiration.  CARDIOVASCULAR: S1, S2 normal. No murmurs, rubs, or gallops.  ABDOMEN: Soft, nontender, nondistended. Bowel sounds present. No organomegaly or mass.  EXTREMITIES: No cyanosis, clubbing or edema b/l.    NEUROLOGIC: on the vent PSYCHIATRIC:  Sedated SKIN: No obvious rash, lesion, or ulcer.   LABORATORY PANEL:  CBC  Recent Labs Lab 10/27/16 0540  WBC 11.3*  HGB 9.8*  HCT 31.4*  PLT 273    Chemistries   Recent Labs Lab 11/10/2016 0758  10/27/16 0540 10/27/16 1425  NA 139  --  138  --   K 3.9  --  3.6  --   CL 106  --  107  --    CO2 21*  --  20*  --   GLUCOSE 178*  --  224*  --   BUN 17  --  28*  --   CREATININE 0.82  --  1.14*  --   CALCIUM 8.9  --  8.6*  --   MG  --   < > 1.5* 1.6*  AST 36  --   --   --   ALT 26  --   --   --   ALKPHOS 121  --   --   --   BILITOT 0.9  --   --   --   < > = values in this interval not displayed. Cardiac Enzymes  Recent Labs Lab 10/27/16 0540  TROPONINI 0.04*   RADIOLOGY:  Dg Abd 1 View  Result Date: 11/14/2016 CLINICAL DATA:  OG tube placement EXAM: ABDOMEN - 1 VIEW COMPARISON:  None. FINDINGS: Mild cardiomegaly.  Left lower lobe consolidation. Atherosclerosis of the aorta. Esophageal tube tip and side-port project over the proximal stomach. IMPRESSION: 1. Esophageal tube tip projects over the proximal stomach 2. Left lower lobe consolidation. Electronically Signed   By: Jasmine Pang M.D.   On: 11/14/2016 22:23   Portable Chest Xray  Result Date: 10/27/2016 CLINICAL DATA:  Respiratory failure. EXAM: PORTABLE CHEST 1 VIEW COMPARISON:  10/29/2016 FINDINGS: Endotracheal tube tip is 2.2 cm above the carina. Nasogastric tube extends into the  stomach and beyond the inferior edge of the image. No pneumothorax. Hazy airspace opacities persist in the central and basilar regions bilaterally. No large effusions. IMPRESSION: 1.  Support equipment appears satisfactorily positioned. 2. No significant interval change in the bilateral airspace opacities. Electronically Signed   By: Ellery Plunk M.D.   On: 10/27/2016 05:46   Portable Chest Xray  Result Date: 11/19/2016 CLINICAL DATA:  Intubated EXAM: PORTABLE CHEST 1 VIEW COMPARISON:  11/11/2016 FINDINGS: Cardiomediastinal silhouette is stable. Again noted bilateral asymmetric patchy airspace disease right greater than left without significant change in aeration. Findings probable due to bilateral pneumonia rather than asymmetric pulmonary edema. Endotracheal tube in place with tip 1.8 cm above the carina. No pneumothorax. IMPRESSION: Again  noted bilateral patchy asymmetric airspace disease right greater than left suspicious for asymmetric pneumonia. Endotracheal tube in place. No pneumothorax. Electronically Signed   By: Natasha Mead M.D.   On: 10/29/2016 16:59   Dg Chest Port 1 View  Result Date: 10/29/2016 CLINICAL DATA:  Sepsis EXAM: PORTABLE CHEST 1 VIEW COMPARISON:  06/28/2016 FINDINGS: 0833 hours. Asymmetric bilateral airspace disease noted, right greater than left. The cardio pericardial silhouette is enlarged. Possible tiny bilateral pleural effusions. Degenerative changes noted left shoulder. Telemetry leads overlie the chest. IMPRESSION: Patchy asymmetric airspace disease, right greater than left. Diffuse pneumonia favored although asymmetric edema could have this appearance. Electronically Signed   By: Kennith Center M.D.   On: 11/01/2016 08:47   ASSESSMENT AND PLAN:  Brandi Gibbs  is a 71 y.o. female with a known history of Multiple sclerosis, wheelchair-bound, restless leg syndrome, neurogenic bladder, hyperparathyroidism, hypertension, arthritis, reflux disease, history of atrial fibrillation status post cardioversion and not on anticoagulation presents to hospital secondary to sudden onset of shortness of breath and cough that started last night.  #1 acute hypoxic respiratory failure-secondary to bibasilar pneumonia. Not on home oxygen. -She was on on BiPAP she deteriorated and is now intubated and on the ventilator day1 -On IV antibiotics. Dual nebs when necessary ordered.  #2 sepsis- secondary to bibasilar pneumonia. Follow blood cultures. -Started on vancomycin and cefepime at this time. Check MRSA PCR. -Narrow antibiotics once cultures are negative  #3 atrial fibrillation with rapid ventricular response-known history of chronic atrial fibrillation status post cardioversion. -Likely triggered by respiratory failure. -Continue Cardizem drip. Started oral metoprolol. Echocardiogram has been ordered. -On heparin drip  for anticoagulation. Also cardiology consulted.  #4 hypertension-blood pressure responded well with IV fluids. On metoprolol orally and also Cardizem drip now. Continue to hold lisinopril  #5 multiple sclerosis with restless leg syndrome-continue home medications. Patient on maintenance Aubagio, also Mirapex for her restless legs. Follows with neurology as outpatient Her main deficits are muscle spasms in lower extremities and also gait issues  #6 DVT prophylaxis-on heparin drip at this time.  Palliative care consultation has been placed. Family to meet this afternoon.  Case discussed with Care Management/Social Worker. Management plans discussed with the patient, family and they are in agreement.  CODE STATUS: DO NOT RESUSCITATE  DVT Prophylaxis: Heparin drip TOTAL TIME TAKING CARE OF THIS PATIENT: 30 minutes.  >50% time spent on counselling and coordination of care    Note: This dictation was prepared with Dragon dictation along with smaller phrase technology. Any transcriptional errors that result from this process are unintentional.  Talal Fritchman M.D on 10/27/2016 at 3:18 PM  Between 7am to 6pm - Pager - [phone]  After 6pm go to [email or web address] - password [email or web address]  [employer]  Office  [phone]  CC: Primary care physician; Dorothey Baseman, MD

## 2016-10-27 NOTE — Progress Notes (Signed)
Per Palliative care do not start any new intervention. I asked about nutrition and she said to hold off on that as well. She is having a meeting with the whole family at 16:00. She did speak with her husband this am.

## 2016-10-27 NOTE — Progress Notes (Signed)
CH responded to a PG for room IC-11. Pt was intubated and appeared at times to be conscious. Husband, son, Renato Gails, and physician were bedside. The husband is having difficulties seeing his wife intubated. CH yielded pastoral authority to Pt pastor. CH will follow up later in the day.    10/27/16 1100  Clinical Encounter Type  Visited With Patient;Patient and family together;Health care provider  Visit Type Initial;Spiritual support;Critical Care  Referral From Nurse  Consult/Referral To Chaplain;Faith community  Spiritual Encounters  Spiritual Needs Prayer

## 2016-10-27 NOTE — Progress Notes (Signed)
MEDICATION RELATED CONSULT NOTE    Pharmacy Consult for electrolyte management   Pharmacy consulted for electrolyte management for 71 yo female requiring mechanical ventilation.   Plan:  Patient received magnesium 4g IV x 1. Most recent magnesium drawn immediately after infusion. Will continue to monitor and replace as warranted.    Allergies  Allergen Reactions  . Other Other (See Comments)    Blood thinners - GI bleed  . Prednisone Other (See Comments)    Causes hair to fall out   . Omeprazole Rash    Patient Measurements: Height: 5\' 3"  (160 cm) Weight: 175 lb 4.3 oz (79.5 kg) IBW/kg (Calculated) : 52.4  Vital Signs: Temp: 98.2 F (36.8 C) (04/04 1524) BP: 131/62 (04/04 1524) Pulse Rate: 109 (04/04 1524) Intake/Output from previous day: 04/03 0701 - 04/04 0700 In: 2234.2 [I.V.:2184.2; IV Piggyback:50] Out: 270 [Urine:270] Intake/Output from this shift: Total I/O In: -  Out: 250 [Urine:250]  Labs:  Recent Labs  10/25/2016 0758 10/27/16 0540 10/27/16 1425 10/27/16 1645  WBC 13.2* 11.3*  --   --   HGB 11.2* 9.8*  --   --   HCT 36.1 31.4*  --   --   PLT 361 273  --   --   CREATININE 0.82 1.14*  --   --   MG  --  1.5* 1.6* 3.3*  PHOS  --   --  2.3* 2.4*  ALBUMIN 3.7  --   --   --   PROT 7.3  --   --   --   AST 36  --   --   --   ALT 26  --   --   --   ALKPHOS 121  --   --   --   BILITOT 0.9  --   --   --    Estimated Creatinine Clearance: 45.8 mL/min (A) (by C-G formula based on SCr of 1.14 mg/dL (H)).   Pharmacy will continue to monitor and adjust per consult.    Breannah Kratt L 10/27/2016,5:21 PM

## 2016-10-27 NOTE — Progress Notes (Deleted)
PULMONARY / CRITICAL CARE MEDICINE   Name: Brandi Gibbs MRN: [id number] DOB: [birth year]    ADMISSION DATE:  11/17/2016 CONSULTATION DATE:  04/03  REFERRING MD: Nemiah Commander  PT PROFILE: 71 y.o. female former smoker with advanced MS, chronic AF admitted via ED with acute respiratory distress of approx 12 hrs duration, cough, hypoxemia and initial CXR with RLL infiltrate. Failed BiPAP in ED and intubated. Pt has Advanced Directives and does not wish to undergo prolonged ventilation but husband opted for short term intubation if there is something reversible. Post-intubation CXR has appearance of pulmonary edema versus acute lung injury  MAJOR EVENTS/TEST RESULTS: 04/03 Admitted via ED. Intubated after failed BiPAP attempt   INDWELLING DEVICES:: ETT 04/03 >>   MICRO DATA: MRSA PCR 04/03 >>  Urine 04/03 >>  Resp 04/03 >>  Blood 04/03 >>   ANTIMICROBIALS:  Vanc 04/03 >>  Cefepime 04/03 >>     Allergies  Allergen Reactions  . Other Other (See Comments)    Blood thinners - GI bleed  . Prednisone Other (See Comments)    Causes hair to fall out   . Omeprazole Rash    No current facility-administered medications on file prior to encounter.    Current Outpatient Prescriptions on File Prior to Encounter  Medication Sig  . acetaminophen (TYLENOL 8 HOUR ARTHRITIS PAIN) 650 MG CR tablet Take 650 mg by mouth 2 (two) times daily.  Marland Kitchen acetaminophen (TYLENOL) 325 MG tablet Take 325 mg by mouth 2 (two) times daily as needed.  . baclofen (LIORESAL) 10 MG tablet Take 1 tablet (10 mg total) by mouth 3 (three) times daily. (Patient taking differently: Take 10 mg by mouth 3 (three) times daily as needed. )  . diltiazem (DILACOR XR) 180 MG 24 hr capsule Take 180 mg by mouth daily.  Marland Kitchen lisinopril (PRINIVIL,ZESTRIL) 40 MG tablet Take 40 mg by mouth 2 (two) times daily.   . methimazole (TAPAZOLE) 10 MG tablet Take 1 tablet (10 mg total) by mouth daily.  . metoprolol tartrate (LOPRESSOR) 25 MG tablet  Take 25 mg by mouth 2 (two) times daily.  Marland Kitchen oxybutynin (DITROPAN) 5 MG tablet TAKE 1 TABLET 3 TIMES DAILY (Patient taking differently: TAKE 1 TABLET 2 TIMES DAILY)  . pramipexole (MIRAPEX) 0.5 MG tablet TAKE 1 TABLET 3 TIMES DAILY (Patient taking differently: TAKE 1 TABLET at bedtime)  . simvastatin (ZOCOR) 40 MG tablet Take 40 mg by mouth at bedtime.   . Teriflunomide 14 MG TABS Take 14 mg by mouth daily.  . Vitamin D, Ergocalciferol, (DRISDOL) 50000 units CAPS capsule Take 50,000 Units by mouth every 30 (thirty) days.    REVIEW OF SYSTEMS:   Unable to obtain as the patient is critically ill and intubated  SUBJECTIVE:  Unable to obtain as the patient is critically ill and intubated  VITAL SIGNS: BP 113/77   Pulse (!) 117   Temp 97.7 F (36.5 C)   Resp 18   Ht 5\' 3"  (1.6 m)   Wt 79.5 kg (175 lb 4.3 oz)   SpO2 99%   BMI 31.05 kg/m   HEMODYNAMICS:    VENTILATOR SETTINGS: Vent Mode: PRVC FiO2 (%):  [50 %-80 %] 50 % Set Rate:  [15 bmp-16 bmp] 15 bmp Vt Set:  [450 mL-500 mL] 500 mL PEEP:  [5 cmH20] 5 cmH20  INTAKE / OUTPUT: I/O last 3 completed shifts: In: 1623.9 [I.V.:1573.9; IV Piggyback:50] Out: -   PHYSICAL EXAMINATION: General: Chronically ill-appearing, intubated and on mechanical ventillation Neuro:  sedated HEENT: NCAT, sclerae white, no jvd Cardiovascular: Tachycardia,IR IR, no m/r/g noted Lungs: scattered rhonchi, no wheezes,crackles,rhonchi noted Abdomen: Soft, nontender, diminished bowel sounds Extremities: Warm , no edema, cyanosis noted Skin:warm , dry and intact  LABS:  BMET  Recent Labs Lab 11/01/2016 0758  NA 139  K 3.9  CL 106  CO2 21*  BUN 17  CREATININE 0.82  GLUCOSE 178*    Electrolytes  Recent Labs Lab 2016-11-01 0758  CALCIUM 8.9    CBC  Recent Labs Lab 01-Nov-2016 0758  WBC 13.2*  HGB 11.2*  HCT 36.1  PLT 361    Coag's No results for input(s): APTT, INR in the last 168 hours.  Sepsis Markers  Recent Labs Lab  2016/11/01 0758 2016-11-01 1100 2016/11/01 1811  LATICACIDVEN 3.0* 2.1*  --   PROCALCITON  --   --  <0.10    ABG  Recent Labs Lab 11/01/2016 0758 11-01-16 1554  PHART 7.43 7.44  PCO2ART 32 30*  PO2ART 158* 291*    Liver Enzymes  Recent Labs Lab 11-01-2016 0758  AST 36  ALT 26  ALKPHOS 121  BILITOT 0.9  ALBUMIN 3.7    Cardiac Enzymes  Recent Labs Lab 11-01-16 0758 2016/11/01 1811 11-01-2016 2301  TROPONINI 0.03* 0.05* 0.05*    Glucose No results for input(s): GLUCAP in the last 168 hours.  ASSESSMENT / PLAN:  PULMONARY A: Acute hypoxemic respiratory failure Suspect right lower lobe pneumonia - high risk of aspiration pneumonia Pulmonary Edema P:   Vent settings established VAP bundle implemented Daily SBT as indicated Nebulized bronchodilators  CARDIOVASCULAR A:  Chronic atrial fibrillation with RVR Borderline hypotension Elevated troponin possibly due to demand ischemia P:  Heparin drip ordered Diltiazem drip ordered DNR in event of cardiac arrest - MAP >60 RENAL A:   No acute issues Monitor I/o P:   Monitor BMET intermittently Monitor I/Os Correct electrolytes as indicated  GASTROINTESTINAL A:   No acute issues P:   SUP: IV famotidine Will initiate TF- 10/27/16  HEMATOLOGIC A:   Mild chronic anemia without acute blood loss P:  DVT px: SQ heparin Monitor CBC intermittently Transfuse per usual guidelines  INFECTIOUS A:   RLL PNA - suspect aspiration P:   Monitor temp, WBC count Micro and abx as above  ENDOCRINE A:   Hyperglycemia  P:   Sliding scale insulin ordered - sensitive scale  NEUROLOGIC A:   Advanced MS Acute encephalopathy ICU/ventilator associated discomfort P:   RASS goal: -2, -3 PAD protocol Propofol/fentanyl   FAMILY -Husband was updated regarding the Plan of care.  Does not want the patient to be resuscitated in the event of cardiac arrest.  Would like to try all possible measures to reverse her condition  for short period of time.     Braleigh Massoud,AG-ACNP Pulmonary & Critical Care

## 2016-10-27 NOTE — Progress Notes (Signed)
ANTICOAGULATION CONSULT NOTE - Initial Consult  Pharmacy Consult for Heparin Drip Indication: atrial fibrillation  Allergies  Allergen Reactions  . Other Other (See Comments)    Blood thinners - GI bleed  . Prednisone Other (See Comments)    Causes hair to fall out   . Omeprazole Rash    Patient Measurements: Height: 5\' 3"  (160 cm) Weight: 175 lb 4.3 oz (79.5 kg) IBW/kg (Calculated) : 52.4 Heparin Dosing Weight: 69 kg  Vital Signs: Temp: 97.7 F (36.5 C) (04/04 0600) Temp Source: Core (Comment) (04/04 0000) BP: 109/92 (04/04 0600) Pulse Rate: 110 (04/04 0600)  Labs:  Recent Labs  2016-11-22 0758 11-22-16 1629 11-22-16 1811 2016/11/22 2301 10/27/16 0540  HGB 11.2*  --   --   --  9.8*  HCT 36.1  --   --   --  31.4*  PLT 361  --   --   --  273  HEPARINUNFRC  --  0.31  --  0.55 0.53  CREATININE 0.82  --   --   --  1.14*  TROPONINI 0.03*  --  0.05* 0.05* 0.04*    Estimated Creatinine Clearance: 45.8 mL/min (A) (by C-G formula based on SCr of 1.14 mg/dL (H)).   Medical History: Past Medical History:  Diagnosis Date  . Allergy   . Anxiety   . Arthritis   . GERD (gastroesophageal reflux disease)   . Hypertension   . Movement disorder   . Multiple sclerosis (HCC)   . Neuromuscular disorder (HCC)   . Restless leg syndrome   . Seizures (HCC)   . Thyroid disease   . Vision abnormalities     Assessment: Patient is a 71yo female admitted for respiratory distress/sepsis. Developed afib and ED physician started patient on Heparin drip at 950 units/hr without a bolus. Hospitalist has now consulted pharmacy for dosing.  Goal of Therapy:  Heparin level 0.3-0.7 units/ml Monitor platelets by anticoagulation protocol: Yes   Plan:  Will continue with Heparin 950 units/hr and check Heparin level in 6 hours. Daily CBC will be obtained while on Heparin drip.  4/3 1629 HL therapeutic x 1. Continue current rate. Will recheck HL in 6 hours.  4/3 23:00 heparin level 0.55.  Continue current regimen. Recheck CBC and heparin level with tomorrow AM labs.  4/4 AM heparin level 0.53. Continue current regimen. Recheck heparin level and CBC with tomorrow AM labs.  Nathan A. Dahlia Bailiff, PharmD, BCPS Clinical Pharmacist 10/27/2016 6:55 AM

## 2016-10-27 NOTE — Progress Notes (Signed)
Initial Nutrition Assessment  DOCUMENTATION CODES:   Obesity unspecified  INTERVENTION:  -Discussed nutrition poc during ICU rounds with MD Ramachandran and received verbal order to initiate EN. With current diprivan, recommend Vital High Protein at rate of 30 ml/hr with Prostat BID providing 920 kcals, 93 g of protein and 605 mL of free water. If unable to increase TF rate due to diprivan rate, recommend addition of MVI. Continue to assess   NUTRITION DIAGNOSIS:   Inadequate oral intake related to acute illness as evidenced by NPO status.  GOAL:   Patient will meet greater than or equal to 90% of their needs  MONITOR:   Vent status, TF tolerance, Weight trends, Labs  REASON FOR ASSESSMENT:   Ventilator    ASSESSMENT:    71 yo female admitted with acute respiratory failure with suspected RLL pneumonia, pulmonary edema. Pt with hx of advanced MS   Pt currently sedated on vent Diprivan: 11.1 ml/hr (293 kcals in 24 hours) Labs: reviewed Meds: LR at 50 ml/hr, diprivan  Diet Order:  Diet Heart Room service appropriate? Yes; Fluid consistency: Thin  Skin:  Reviewed, no issues  Last BM:  no documented BM  Height:   Ht Readings from Last 1 Encounters:  Nov 01, 2016 5\' 3"  (1.6 m)    Weight:   Wt Readings from Last 1 Encounters:  10/27/16 175 lb 4.3 oz (79.5 kg)    Ideal Body Weight:  52.3 kg  BMI:  Body mass index is 31.05 kg/m.  Estimated Nutritional Needs:   Kcal:  [phone] kcals  Protein:  105-131 g  Fluid:  >/= 1.4 L  EDUCATION NEEDS:   No education needs identified at this time  Romelle Starcher MS, RD, LDN [phone] Pager  [phone] Weekend/On-Call Pager

## 2016-10-27 NOTE — Progress Notes (Signed)
Pharmacy Antibiotic Note  Brandi Gibbs is a 71 y.o. female admitted on 11/15/2016 with sepsis and respiratory distress.  Pharmacy has been consulted for Vancomycin and Cefepime dosing. Received Vancomycin 1g IV and Zosyn 3.375g IV in the ED.  Plan: Patient's renal function has declined.   Will transition patient to cefepime 2g IV Q12hr.   Will transition patient to vancomycin 1g IV Q24hr.   Will obtain follow up creatinine with am labs.   Height: 5\' 3"  (160 cm) Weight: 175 lb 4.3 oz (79.5 kg) IBW/kg (Calculated) : 52.4  Temp (24hrs), Avg:98.1 F (36.7 C), Min:97.7 F (36.5 C), Max:99.3 F (37.4 C)   Recent Labs Lab 11/15/2016 0758 11/01/2016 1100 10/27/16 0540  WBC 13.2*  --  11.3*  CREATININE 0.82  --  1.14*  LATICACIDVEN 3.0* 2.1*  --     Estimated Creatinine Clearance: 45.8 mL/min (A) (by C-G formula based on SCr of 1.14 mg/dL (H)).    Allergies  Allergen Reactions  . Other Other (See Comments)    Blood thinners - GI bleed  . Prednisone Other (See Comments)    Causes hair to fall out   . Omeprazole Rash    Antimicrobials this admission: Zosyn  4/3 x 1 dose Vancomycin 4/3 >>  Cefepime 4/3 >>  Dose adjustments this admission: 4/4 cefepime transitioned to 2g IV Q12hr and vancomycin transitioned to 1g IV q24hr.   Microbiology results: 4/3 BCx: no growth < 24 hr 4/3 UCx: no growth  4/3 Sputum: no growth < 12h 4/3 MRSA PCR: negative   Pharmacy will continue to monitor and adjust per consult.   Simpson,Michael L 10/27/2016 5:25 PM

## 2016-10-27 NOTE — Consult Note (Signed)
Cardiology Consultation Note  Patient ID: Brandi Gibbs, MRN: [id number], DOB/AGE: August 24, 1945 71 y.o. Admit date: 11/10/2016   Date of Consult: 10/27/2016 Primary Physician: Juluis Pitch, MD Primary Cardiologist: New to [facility] - previously seen by [facility] (most recently in 2016) Requesting Physician: Dr. Tressia Miners, MD  Chief Complaint: SOB Reason for Consult: PAF with RVR  HPI: Brandi Gibbs is a 71 y.o. female who is being seen today for the evaluation of PAF with RVR at the request of Dr. Tressia Miners. Patient has a h/o PAF s/p prior remote DCCV (date unknown) not on full-dose anticoagulation, MS, CVD s/p prior right CEA, hyperthyroidism on methimazole, cognitive decline, RLS, HTN, HLD, neurogenic bladder, and PVD who presented to [facility] on 4/3 with increased SOB and was found to have acute hypoxic respiratory failure 2/2 bilateral PNA with possible acute CHF.   Patient previously followed by [facility], most recently being seen in 2016 apparently for elevated troponin. No prior cardiac studies for review in Epic or Care Everywhere. History is taken from prior notes given patient is intubated and there is no family present at this time.   Patient presented to [facility] on 4/3 with increased SOB and a cough that started on the night of 4/2. Uncertain if patient had chest pain, palpitations, nausea, vomiting, diaphoresis, was febrile, had myalgias, or chills.   Upon the patient's arrival to [facility] they were found to have BP 121/103, HR 160 bpm, temp 98.3, oxygen saturation 60s% requiring BiPAP and eventually intubation, weight 170 pounds. EKG showed Afib with RVR, wandering baseline, 150 bpm, nonspecific st/t changes, CXR showed diffuse PNA vs asymmetric edema. Labs showed troponin peak of 0.05 and down trending, PCT <0.10, WBC 13.2-->11.3, HGB 11.2, PLT 361, SCr 0.82-->1.14, K+ 3.9-->3.6, TSH normal, respiratory and blood cultures are pending. She was started on heparin gtt for anticoagulation given her  Afib, cefepime, vancomycin, and Zosyn. Heart rate has improved with loading dose of digoxin and diltiazem gtt, currently at 10 mg/hr. Blood pressure has been wide ranging from the 811B systolic to the 14N systolic. Currently with well controlled heart rate in the 80s bpm with a MAP > 65 mmHg.   Per prior notes, family does not want patient to undergo prolonged intubation.   Past Medical History:  Diagnosis Date  . AKI (acute kidney injury) ([place]) 10/27/2016  . Allergy   . Anxiety   . Arthritis   . GERD (gastroesophageal reflux disease)   . Hypertension   . Hyperthyroidism   . Movement disorder   . Multiple sclerosis ([place])   . Neuromuscular disorder ([place])   . PAF (paroxysmal atrial fibrillation) ([place])   . Restless leg syndrome   . Seizures ([place])   . Vision abnormalities       Most Recent Cardiac Studies: none   Surgical History:  Past Surgical History:  Procedure Laterality Date  . BACK SURGERY    . PARTIAL HYSTERECTOMY       Home Meds: Prior to Admission medications   Medication Sig Start Date End Date Taking? Authorizing Provider  acetaminophen (TYLENOL 8 HOUR ARTHRITIS PAIN) 650 MG CR tablet Take 650 mg by mouth 2 (two) times daily.   Yes Historical Provider, MD  acetaminophen (TYLENOL) 325 MG tablet Take 325 mg by mouth 2 (two) times daily as needed.   Yes Historical Provider, MD  baclofen (LIORESAL) 10 MG tablet Take 1 tablet (10 mg total) by mouth 3 (three) times daily. Patient taking differently: Take 10 mg by mouth  3 (three) times daily as needed.  04/28/15  Yes Britt Bottom, MD  diltiazem (DILACOR XR) 180 MG 24 hr capsule Take 180 mg by mouth daily.   Yes Historical Provider, MD  lisinopril (PRINIVIL,ZESTRIL) 40 MG tablet Take 40 mg by mouth 2 (two) times daily.    Yes Historical Provider, MD  methimazole (TAPAZOLE) 10 MG tablet Take 1 tablet (10 mg total) by mouth daily. 07/03/15  Yes Bettey Costa, MD  metoprolol tartrate (LOPRESSOR) 25 MG tablet Take 25 mg by mouth 2  (two) times daily.   Yes Historical Provider, MD  oxybutynin (DITROPAN) 5 MG tablet TAKE 1 TABLET 3 TIMES DAILY Patient taking differently: TAKE 1 TABLET 2 TIMES DAILY 05/20/16  Yes Britt Bottom, MD  pramipexole (MIRAPEX) 0.5 MG tablet TAKE 1 TABLET 3 TIMES DAILY Patient taking differently: TAKE 1 TABLET at bedtime 10/23/15  Yes Britt Bottom, MD  simvastatin (ZOCOR) 40 MG tablet Take 40 mg by mouth at bedtime.    Yes Historical Provider, MD  Teriflunomide 14 MG TABS Take 14 mg by mouth daily. 07/29/15  Yes Britt Bottom, MD  Vitamin D, Ergocalciferol, (DRISDOL) 50000 units CAPS capsule Take 50,000 Units by mouth every 30 (thirty) days.   Yes Historical Provider, MD    Inpatient Medications:  . ceFEPime (MAXIPIME) IV  2 g Intravenous Q8H  . chlorhexidine gluconate (MEDLINE KIT)  15 mL Mouth Rinse BID  . famotidine (PEPCID) IV  20 mg Intravenous Q12H  . ipratropium-albuterol  3 mL Nebulization Q6H  . mouth rinse  15 mL Mouth Rinse QID  . methimazole  10 mg Oral Daily  . pramipexole  0.5 mg Oral QHS  . simvastatin  40 mg Oral QHS  . sodium chloride flush  3 mL Intravenous Q12H  . Teriflunomide  14 mg Oral Daily  . vancomycin  1,000 mg Intravenous Q18H   . diltiazem (CARDIZEM) infusion 10 mg/hr (10/27/16 0600)  . heparin    . lactated ringers 50 mL/hr at 10/27/16 0600  . propofol (DIPRIVAN) infusion 25.076 mcg/kg/min (10/27/16 0200)    Allergies:  Allergies  Allergen Reactions  . Other Other (See Comments)    Blood thinners - GI bleed  . Prednisone Other (See Comments)    Causes hair to fall out   . Omeprazole Rash    Social History   Social History  . Marital status: Married    Spouse name: N/A  . Number of children: N/A  . Years of education: N/A   Occupational History  . Not on file.   Social History Main Topics  . Smoking status: Former Smoker    Types: Cigarettes  . Smokeless tobacco: Never Used  . Alcohol use No     Comment: rare/fim  . Drug use: No  .  Sexual activity: No   Other Topics Concern  . Not on file   Social History Narrative   Lives at home with husband. Wheelchair bound at baseline     Family History  Problem Relation Age of Onset  . Leukemia Mother   . Healthy Father      Review of Systems: Review of Systems  Unable to perform ROS: Intubated    Labs:  Recent Labs  10/27/2016 0758 11/05/2016 1811 11/04/2016 2301 10/27/16 0540  TROPONINI 0.03* 0.05* 0.05* 0.04*   Lab Results  Component Value Date   WBC 11.3 (H) 10/27/2016   HGB 9.8 (L) 10/27/2016   HCT 31.4 (L) 10/27/2016   MCV 74.0 (  L) 10/27/2016   PLT 273 10/27/2016     Recent Labs Lab 11/19/2016 0758 10/27/16 0540  NA 139 138  K 3.9 3.6  CL 106 107  CO2 21* 20*  BUN 17 28*  CREATININE 0.82 1.14*  CALCIUM 8.9 8.6*  PROT 7.3  --   BILITOT 0.9  --   ALKPHOS 121  --   ALT 26  --   AST 36  --   GLUCOSE 178* 224*   Lab Results  Component Value Date   TRIG 122 11/03/2016   No results found for: DDIMER  Radiology/Studies:  Dg Abd 1 View  Result Date: 10/24/2016 CLINICAL DATA:  OG tube placement EXAM: ABDOMEN - 1 VIEW COMPARISON:  None. FINDINGS: Mild cardiomegaly.  Left lower lobe consolidation. Atherosclerosis of the aorta. Esophageal tube tip and side-port project over the proximal stomach. IMPRESSION: 1. Esophageal tube tip projects over the proximal stomach 2. Left lower lobe consolidation. Electronically Signed   By: Donavan Foil M.D.   On: 11/21/2016 22:23   Portable Chest Xray  Result Date: 10/27/2016 CLINICAL DATA:  Respiratory failure. EXAM: PORTABLE CHEST 1 VIEW COMPARISON:  11/20/2016 FINDINGS: Endotracheal tube tip is 2.2 cm above the carina. Nasogastric tube extends into the stomach and beyond the inferior edge of the image. No pneumothorax. Hazy airspace opacities persist in the central and basilar regions bilaterally. No large effusions. IMPRESSION: 1.  Support equipment appears satisfactorily positioned. 2. No significant interval  change in the bilateral airspace opacities. Electronically Signed   By: Andreas Newport M.D.   On: 10/27/2016 05:46   Portable Chest Xray  Result Date: 10/27/2016 CLINICAL DATA:  Intubated EXAM: PORTABLE CHEST 1 VIEW COMPARISON:  10/25/2016 FINDINGS: Cardiomediastinal silhouette is stable. Again noted bilateral asymmetric patchy airspace disease right greater than left without significant change in aeration. Findings probable due to bilateral pneumonia rather than asymmetric pulmonary edema. Endotracheal tube in place with tip 1.8 cm above the carina. No pneumothorax. IMPRESSION: Again noted bilateral patchy asymmetric airspace disease right greater than left suspicious for asymmetric pneumonia. Endotracheal tube in place. No pneumothorax. Electronically Signed   By: Lahoma Crocker M.D.   On: 11/21/2016 16:59   Dg Chest Port 1 View  Result Date: 11/10/2016 CLINICAL DATA:  Sepsis EXAM: PORTABLE CHEST 1 VIEW COMPARISON:  06/28/2016 FINDINGS: 0833 hours. Asymmetric bilateral airspace disease noted, right greater than left. The cardio pericardial silhouette is enlarged. Possible tiny bilateral pleural effusions. Degenerative changes noted left shoulder. Telemetry leads overlie the chest. IMPRESSION: Patchy asymmetric airspace disease, right greater than left. Diffuse pneumonia favored although asymmetric edema could have this appearance. Electronically Signed   By: Misty Stanley M.D.   On: 11/11/2016 08:47    EKG: Interpreted by me showed: Afib with RVR, wandering baseline, 150 bpm, nonspecific st/t changes Telemetry: Interpreted by me showed: Afib with heart rates ranging from the 160s to 80s bpm  Weights: Filed Weights   11/10/2016 0837 10/27/16 0422  Weight: 170 lb (77.1 kg) 175 lb 4.3 oz (79.5 kg)     Physical Exam: Blood pressure (!) 74/59, pulse 93, temperature 97.7 F (36.5 C), resp. rate 16, height '5\' 3"'$  (1.6 m), weight 175 lb 4.3 oz (79.5 kg), SpO2 99 %. Body mass index is 31.05  kg/m. General: Critically ill appearing, in no acute distress, intubated and sedated.  Head: Normocephalic, atraumatic, sclera non-icteric, no xanthomas, nares are without discharge.  Neck: Negative for carotid bruits. JVD difficult to assess. Lungs: Diminished breath sounds throughout, though more  so along the bilateral bases. Intubated. Heart: Irregularly irregular with S1 S2. I/VI systolic murmur, no rubs, or gallops appreciated. Abdomen: Soft, non-tender, non-distended with normoactive bowel sounds. No hepatomegaly. No rebound/guarding. No obvious abdominal masses. Msk:  Frail appearing. Extremities: No clubbing or cyanosis. No edema. Distal pedal pulses are 2+ and equal bilaterally. Neuro: Intubated and sedated. Psych:  Intubated and sedated.    Assessment and Plan:  Principal Problem:   Acute respiratory failure with hypoxia (HCC) Active Problems:   Multiple sclerosis (HCC)   Septic shock (HCC)   Right lower lobe pneumonia (HCC)   PAF (paroxysmal atrial fibrillation) (HCC)   Atrial fibrillation with RVR (HCC)   Leukocytosis   AKI (acute kidney injury) ([place])   Hyperthyroidism   Elevated troponin   Cognitive changes    1. Acute hypoxic respiratory failure: -Initially on BiPAP, failed, requiring intubation -Per prior notes, family does not want prolonged intubation -Likely in the setting of RLL PNA and possible acute CHF -Per primary service   2. Elevated troponin: -Minimally elevated with a peak of 0.05 and down trending -Likely 2/2 supply demand ischemia 2/2 RLL PNA -Echo pending -Would need improvement in acute illness prior to possible ischemic evaluation down the road -Heparin gtt -ASA -Hold beta blocker given hypotension -Simvastatin  3. PAF with RVR: -EKG as recent as 06/2016 demonstrating sinus rhythm -Currently rate controlled with heart rates in the 80s bpm, remains in Afib -Hold Lopressor 2/2 hypotension -Continue diltiazem gtt, wean as able given soft  BP -Heparin gtt -CHADS2VASc at least 4 (HTN, age x 1, vascular disease, female) -Not on long term full-dose anticoagulation as an outpatient, uncertain if patient was seeing a cardiologist as an outpatient -Unlikely to be a good candidate for long term, full-dose anticoagulation given gait instability  -Replete K+ to goal of at least 4.0 -Check magnesium and replete to goal of at least 2.0  4. Septic shock 2/2 RLL PNA: -Requiring intubation as above -ABX per PCCM -Maintain MAP > 65 mmHg, unclear at this time if family would want pressor support, will need to discuss with them  5. Hyperthyroidism: -TSH normal -Methimazole  6. MS: -Per IM   Signed, Christell Faith, PA-C [facility] Pager: [phone] 10/27/2016, 7:47 AM

## 2016-10-27 NOTE — Consult Note (Signed)
Consultation Note Date: 10/27/2016   Patient Name: Brandi Gibbs  DOB: Feb 10, 1946  MRN: [id number]  Age / Sex: 71 y.o., female  PCP: Juluis Pitch, MD Referring Physician: Fritzi Mandes, MD  Reason for Consultation: Establishing goals of care  HPI/Patient Profile: 71 y.o. female  with past medical history of multiple sclerosis, neurogenic bladder, afib, seizures, and restless leg who was admitted on 10/25/2016 with hypoxic respiratory failure secondary to pneumonia and septic shock. She was intubated in the ER and is being treated in the ICU.  Her family medical doctor from [place] came to the ICU and brought a MOST form the that patient completed last week (3/27) that indicated she was a DNR and wanted comfort measures only.  Clinical Assessment and Goals of Care:  I have reviewed medical records including EPIC notes, labs and imaging, received report from the bedside RN, and the ICU MD, assessed the patient and then met at the bedside along with her husband, sons and pastor  to discuss diagnosis prognosis, GOC, EOL wishes, disposition and options.  I introduced Palliative Medicine as specialized medical care for people living with serious illness. It focuses on providing relief from the symptoms and stress of a serious illness. The goal is to improve quality of life for both the patient and the family.  We discussed a brief life review of the patient. She has two sons Josph Macho and Ronalee Belts) by a previous marriage.  She has been married to Lloyd for 30 years.  They were both working a [employer] when they met.  Lamesha has been wheel chair bound for many years and over the past month has declined.  She has become very weak and fatigued.  We talked about the probability of having a significantly decreased quality of life if she stayed on the vent and survived.  Her sons were supportive of her wishes and felt that a plan  should be made to extubate her as that is what she would want.  Don agreed.     We decided to meet in the ICU at approximately 9:30 am on 4/5 to touch base and develop a plan for extubation tomorrow without re-intubation.  In the mean time we will continue current care, but not escalate care.  If she passes this evening or overnight. the family would want her to be let go peacefully with no further attempts to resuscitate.   Primary Decision Maker:  NEXT OF KIN Husband and sons    SUMMARY OF RECOMMENDATIONS    Family will meet in ICU tomorrow at 9:30 to develop a plan for extubation without re-intubation.  Our hope is that she will be able to survive extubation.   Code Status/Advance Care Planning:  DNR    Symptom Management:   Per primary team  Psycho-social/Spiritual:   Desire for further Chaplaincy support: Yes - Family's pastor Sherren Mocha is present.  Prognosis:   Hours - Days  Discharge Planning: Anticipated Hospital Death      Primary Diagnoses: Present on Admission: . Acute  respiratory failure with hypoxia ([place]) . Septic shock ([place]) . Right lower lobe pneumonia ([place]) . PAF (paroxysmal atrial fibrillation) ([place]) . Atrial fibrillation with RVR ([place]) . Hyperthyroidism . Cognitive changes . Multiple sclerosis ([place])   I have reviewed the medical record, interviewed the patient and family, and examined the patient. The following aspects are pertinent.  Past Medical History:  Diagnosis Date  . AKI (acute kidney injury) ([place]) 10/27/2016  . Allergy   . Anxiety   . Arthritis   . GERD (gastroesophageal reflux disease)   . Hypertension   . Hyperthyroidism   . Movement disorder   . Multiple sclerosis ([place])   . Neuromuscular disorder ([place])   . PAF (paroxysmal atrial fibrillation) ([place])   . Restless leg syndrome   . Seizures ([place])   . Vision abnormalities    Social History   Social History  . Marital status: Married    Spouse name: N/A  . Number of children:  N/A  . Years of education: N/A   Social History Main Topics  . Smoking status: Former Smoker    Types: Cigarettes  . Smokeless tobacco: Never Used  . Alcohol use No     Comment: rare/fim  . Drug use: No  . Sexual activity: No   Other Topics Concern  . None   Social History Narrative   Lives at home with husband. Wheelchair bound at baseline   Family History  Problem Relation Age of Onset  . Leukemia Mother   . Healthy Father    Scheduled Meds: . [START ON 10/28/2016] ceFEPime (MAXIPIME) IV  2 g Intravenous Q24H  . chlorhexidine gluconate (MEDLINE KIT)  15 mL Mouth Rinse BID  . famotidine  20 mg Per Tube Daily  . insulin aspart  0-15 Units Subcutaneous Q4H  . ipratropium-albuterol  3 mL Nebulization Q6H  . mouth rinse  15 mL Mouth Rinse QID  . methimazole  10 mg Oral Daily  . pramipexole  0.5 mg Oral QHS  . simvastatin  40 mg Oral QHS  . sodium chloride flush  3 mL Intravenous Q12H  . Teriflunomide  14 mg Oral Daily  . vancomycin  1,000 mg Intravenous Q18H   Continuous Infusions: . diltiazem (CARDIZEM) infusion 10 mg/hr (10/27/16 1043)  . heparin 950 Units/hr (10/27/16 1306)  . lactated ringers 50 mL/hr at 10/27/16 0600  . propofol (DIPRIVAN) infusion 20 mcg/kg/min (10/27/16 1043)   PRN Meds:.acetaminophen **OR** acetaminophen, etomidate, fentaNYL (SUBLIMAZE) injection, ondansetron **OR** ondansetron (ZOFRAN) IV, succinylcholine Allergies  Allergen Reactions  . Other Other (See Comments)    Blood thinners - GI bleed  . Prednisone Other (See Comments)    Causes hair to fall out   . Omeprazole Rash   Review of Systems patient intubated  Physical Exam  Patient intubated.  Well developed thin female, eyes open, moving tongue, on the vent.  Vital Signs: BP 131/62   Pulse (!) 109   Temp 98.2 F (36.8 C)   Resp 16   Ht '5\' 3"'$  (1.6 m)   Wt 79.5 kg (175 lb 4.3 oz)   SpO2 95%   BMI 31.05 kg/m  Pain Assessment: CPOT       SpO2: SpO2: 95 % O2 Device:SpO2: 95  % O2 Flow Rate: .O2 Flow Rate (L/min): 80 L/min  IO: Intake/output summary:  Intake/Output Summary (Last 24 hours) at 10/27/16 1715 Last data filed at 10/27/16 1410  Gross per 24 hour  Intake  634.84 ml  Output              520 ml  Net           114.84 ml    LBM:   Baseline Weight: Weight: 77.1 kg (170 lb) Most recent weight: Weight: 79.5 kg (175 lb 4.3 oz)     Palliative Assessment/Data:   Flowsheet Rows     Most Recent Value  Intake Tab  Referral Department  Critical care  Unit at Time of Referral  Med/Surg Unit  Palliative Care Primary Diagnosis  Sepsis/Infectious Disease  Date Notified  10/27/16  Palliative Care Type  New Palliative care  Reason for referral  Clarify Goals of Care  Date of Admission  11/20/2016  Date first seen by Palliative Care  10/27/16  # of days Palliative referral response time  0 Day(s)  # of days IP prior to Palliative referral  1  Clinical Assessment  Palliative Performance Scale Score  10%  Psychosocial & Spiritual Assessment  Palliative Care Outcomes  Patient/Family meeting held?  Yes  Who was at the meeting?  patient, husband, 2 sons and pastor  Palliative Care Outcomes  Clarified goals of care  Patient/Family wishes: Interventions discontinued/not started   Mechanical Ventilation, PEG, Tube feedings/TPN, Vasopressors      Time In: 3:00 Time Out: 5:00 Time Total: 120 Greater than 50%  of this time was spent counseling and coordinating care related to the above assessment and plan.  Signed by: Imogene Burn, PA-C Palliative Medicine Pager: [phone]  Please contact Palliative Medicine Team phone at [phone] for questions and concerns.  For individual provider: See Shea Evans

## 2016-10-27 NOTE — Progress Notes (Signed)
PULMONARY / CRITICAL CARE MEDICINE   Name: Brandi Gibbs MRN: [id number] DOB: 09/25/1945    ADMISSION DATE:  Oct 28, 2016 CONSULTATION DATE:  04/03  REFERRING MD: Nemiah Commander  PT PROFILE: 71 y.o. female former smoker with advanced MS, chronic AF admitted via ED with acute respiratory distress of approx 12 hrs duration, cough, hypoxemia and initial CXR with RLL infiltrate. Failed BiPAP in ED and intubated. Pt has Advanced Directives and does not wish to undergo prolonged ventilation but husband opted for short term intubation if there is something reversible. Post-intubation CXR has appearance of pulmonary edema versus acute lung injury  MAJOR EVENTS/TEST RESULTS: 04/03 Admitted via ED. Intubated after failed BiPAP attempt   INDWELLING DEVICES:: ETT 04/03 >>   MICRO DATA: MRSA PCR 04/03 >>  Urine 04/03 >>  Resp 04/03 >>  Blood 04/03 >>   ANTIMICROBIALS:  Vanc 04/03 >>  Cefepime 04/03 >>     Allergies  Allergen Reactions  . Other Other (See Comments)    Blood thinners - GI bleed  . Prednisone Other (See Comments)    Causes hair to fall out   . Omeprazole Rash    No current facility-administered medications on file prior to encounter.    Current Outpatient Prescriptions on File Prior to Encounter  Medication Sig  . acetaminophen (TYLENOL 8 HOUR ARTHRITIS PAIN) 650 MG CR tablet Take 650 mg by mouth 2 (two) times daily.  Marland Kitchen acetaminophen (TYLENOL) 325 MG tablet Take 325 mg by mouth 2 (two) times daily as needed.  . baclofen (LIORESAL) 10 MG tablet Take 1 tablet (10 mg total) by mouth 3 (three) times daily. (Patient taking differently: Take 10 mg by mouth 3 (three) times daily as needed. )  . diltiazem (DILACOR XR) 180 MG 24 hr capsule Take 180 mg by mouth daily.  Marland Kitchen lisinopril (PRINIVIL,ZESTRIL) 40 MG tablet Take 40 mg by mouth 2 (two) times daily.   . methimazole (TAPAZOLE) 10 MG tablet Take 1 tablet (10 mg total) by mouth daily.  . metoprolol tartrate (LOPRESSOR) 25 MG tablet  Take 25 mg by mouth 2 (two) times daily.  Marland Kitchen oxybutynin (DITROPAN) 5 MG tablet TAKE 1 TABLET 3 TIMES DAILY (Patient taking differently: TAKE 1 TABLET 2 TIMES DAILY)  . pramipexole (MIRAPEX) 0.5 MG tablet TAKE 1 TABLET 3 TIMES DAILY (Patient taking differently: TAKE 1 TABLET at bedtime)  . simvastatin (ZOCOR) 40 MG tablet Take 40 mg by mouth at bedtime.   . Teriflunomide 14 MG TABS Take 14 mg by mouth daily.  . Vitamin D, Ergocalciferol, (DRISDOL) 50000 units CAPS capsule Take 50,000 Units by mouth every 30 (thirty) days.    REVIEW OF SYSTEMS:   Unable to obtain as the patient is critically ill and intubated  SUBJECTIVE:  Unable to obtain as the patient is critically ill and intubated  VITAL SIGNS: BP 113/77   Pulse (!) 117   Temp 97.7 F (36.5 C)   Resp 18   Ht 5\' 3"  (1.6 m)   Wt 79.5 kg (175 lb 4.3 oz)   SpO2 99%   BMI 31.05 kg/m   HEMODYNAMICS:    VENTILATOR SETTINGS: Vent Mode: PRVC FiO2 (%):  [50 %-80 %] 50 % Set Rate:  [15 bmp-16 bmp] 15 bmp Vt Set:  [450 mL-500 mL] 500 mL PEEP:  [5 cmH20] 5 cmH20  INTAKE / OUTPUT: I/O last 3 completed shifts: In: 1623.9 [I.V.:1573.9; IV Piggyback:50] Out: -   PHYSICAL EXAMINATION: General: Chronically ill-appearing, intubated and on mechanical ventillation Neuro:  sedated HEENT: NCAT, sclerae white, no jvd Cardiovascular: Tachycardia,IR IR, no m/r/g noted Lungs: scattered rhonchi, no wheezes,crackles,rhonchi noted Abdomen: Soft, nontender, diminished bowel sounds Extremities: Warm , no edema, cyanosis noted Skin:warm , dry and intact  LABS:  BMET  Recent Labs Lab 11/20/2016 0758  NA 139  K 3.9  CL 106  CO2 21*  BUN 17  CREATININE 0.82  GLUCOSE 178*    Electrolytes  Recent Labs Lab 10/25/2016 0758  CALCIUM 8.9    CBC  Recent Labs Lab 11/13/2016 0758  WBC 13.2*  HGB 11.2*  HCT 36.1  PLT 361    Coag's No results for input(s): APTT, INR in the last 168 hours.  Sepsis Markers  Recent Labs Lab  11/13/2016 0758 11/09/2016 1100 10/24/2016 1811  LATICACIDVEN 3.0* 2.1*  --   PROCALCITON  --   --  <0.10    ABG  Recent Labs Lab 11/20/2016 0758 11/04/2016 1554  PHART 7.43 7.44  PCO2ART 32 30*  PO2ART 158* 291*    Liver Enzymes  Recent Labs Lab 11/03/2016 0758  AST 36  ALT 26  ALKPHOS 121  BILITOT 0.9  ALBUMIN 3.7    Cardiac Enzymes  Recent Labs Lab 11/05/2016 0758 11/04/2016 1811 11/04/2016 2301  TROPONINI 0.03* 0.05* 0.05*    Glucose No results for input(s): GLUCAP in the last 168 hours.  ASSESSMENT / PLAN:  PULMONARY A: Acute hypoxemic respiratory failure Suspect right lower lobe pneumonia - high risk of aspiration pneumonia Pulmonary Edema P:   Vent settings established VAP bundle implemented Daily SBT as indicated Nebulized bronchodilators  CARDIOVASCULAR A:  Chronic atrial fibrillation with RVR Borderline hypotension Elevated troponin possibly due to demand ischemia P:  Heparin drip ordered Diltiazem drip ordered DNR in event of cardiac arrest - MAP >60 RENAL A:   No acute issues Monitor I/o P:   Monitor BMET intermittently Monitor I/Os Correct electrolytes as indicated  GASTROINTESTINAL A:   No acute issues P:   SUP: IV famotidine Will initiate TF- 10/27/16  HEMATOLOGIC A:   Mild chronic anemia without acute blood loss P:  DVT px: SQ heparin Monitor CBC intermittently Transfuse per usual guidelines  INFECTIOUS A:   RLL PNA - suspect aspiration P:   Monitor temp, WBC count Micro and abx as above  ENDOCRINE A:   Hyperglycemia  P:   Sliding scale insulin ordered - sensitive scale  NEUROLOGIC A:   Advanced MS Acute encephalopathy ICU/ventilator associated discomfort P:   RASS goal: -2, -3 PAD protocol Propofol/fentanyl   FAMILY -Husband was updated regarding the Plan of care.  Does not want the patient to be resuscitated in the event of cardiac arrest.  Would like to try all possible measures to reverse her condition  for short period of time.     Bincy Varughese,AG-ACNP Pulmonary & Critical Care  Patient was seen and examined with NP, above note is reflective of my findings, assessment, plan. Patient was admitted today with acute respiratory failure, suspected due to right lower lobe pneumonia and/or pulmonary edema. Since her admission, her FiO2 has been decreased, her breathing appears to be more comfortable than previous when she was on BiPAP. Per charting it was noted that the patient was DO NOT RESUSCITATE and did not want intubation, therefore she was intubated to see if she can be turned around in the short-term. Currently the patient is mildly hypotensive and may require placement of a central line. I did discuss with the husband that given this, it appears that  we may need to escalate her care. Further, and we discussed whether this would be appropriate in her. He is going to consult with rest. The family to see if we should continue care, as he appears to be somewhat confused about how to proceed. Meanwhile, we will continue to treat the patient with supportive care including ventilatory support, IV antibiotics. My independent review of the chest x-ray films shows continued elevated right diaphragm with possible pneumonia.  Wells Guiles, M.D. 10/27/2016   Critical Care Attestation.  I have personally obtained a history, examined the patient, evaluated laboratory and imaging results, formulated the assessment and plan and placed orders. The Patient requires high complexity decision making for assessment and support, frequent evaluation and titration of therapies, application of advanced monitoring technologies and extensive interpretation of multiple databases. The patient has critical illness that could lead imminently to failure of 1 or more organ systems and requires the highest level of physician preparedness to intervene.  Critical Care Time devoted to patient care services described in this  note is 35 minutes and is exclusive of time spent in procedures.

## 2016-10-28 ENCOUNTER — Inpatient Hospital Stay: Payer: Medicare (Managed Care)

## 2016-10-28 DIAGNOSIS — R0600 Dyspnea, unspecified: Secondary | ICD-10-CM

## 2016-10-28 DIAGNOSIS — R0603 Acute respiratory distress: Secondary | ICD-10-CM

## 2016-10-28 LAB — CBC
HEMATOCRIT: 28.8 % — AB (ref 35.0–47.0)
HEMOGLOBIN: 9.2 g/dL — AB (ref 12.0–16.0)
MCH: 23.8 pg — AB (ref 26.0–34.0)
MCHC: 31.8 g/dL — AB (ref 32.0–36.0)
MCV: 74.8 fL — ABNORMAL LOW (ref 80.0–100.0)
Platelets: 236 10*3/uL (ref 150–440)
RBC: 3.86 MIL/uL (ref 3.80–5.20)
RDW: 18.1 % — AB (ref 11.5–14.5)
WBC: 9.7 10*3/uL (ref 3.6–11.0)

## 2016-10-28 LAB — BASIC METABOLIC PANEL
Anion gap: 5 (ref 5–15)
BUN: 24 mg/dL — AB (ref 6–20)
CHLORIDE: 111 mmol/L (ref 101–111)
CO2: 24 mmol/L (ref 22–32)
CREATININE: 0.93 mg/dL (ref 0.44–1.00)
Calcium: 8.4 mg/dL — ABNORMAL LOW (ref 8.9–10.3)
GFR calc Af Amer: >60 mL/min (ref >60)
GFR calc non Af Amer: >60 mL/min (ref >60)
GLUCOSE: 122 mg/dL — AB (ref 65–99)
POTASSIUM: 3 mmol/L — AB (ref 3.5–5.1)
SODIUM: 140 mmol/L (ref 135–145)

## 2016-10-28 LAB — HEMOGLOBIN A1C
Hgb A1c MFr Bld: 5.9 % — ABNORMAL HIGH (ref 4.8–5.6)
Mean Plasma Glucose: 123 mg/dL

## 2016-10-28 LAB — BLOOD GAS, ARTERIAL
Acid-Base Excess: 1.7 mmol/L (ref 0.0–2.0)
BICARBONATE: 25.7 mmol/L (ref 20.0–28.0)
FIO2: 0.4
MECHANICAL RATE: 15
MECHVT: 500 mL
O2 Saturation: 97.9 %
PATIENT TEMPERATURE: 37
PEEP: 5 cmH2O
PO2 ART: 98 mmHg (ref 83.0–108.0)
pCO2 arterial: 37 mmHg (ref 32.0–48.0)
pH, Arterial: 7.45 (ref 7.350–7.450)

## 2016-10-28 LAB — PHOSPHORUS: PHOSPHORUS: 2.2 mg/dL — AB (ref 2.5–4.6)

## 2016-10-28 LAB — GLUCOSE, CAPILLARY
GLUCOSE-CAPILLARY: 123 mg/dL — AB (ref 65–99)
GLUCOSE-CAPILLARY: 105 mg/dL — AB (ref 65–99)
GLUCOSE-CAPILLARY: 107 mg/dL — AB (ref 65–99)
Glucose-Capillary: 145 mg/dL — ABNORMAL HIGH (ref 65–99)

## 2016-10-28 LAB — HEPARIN LEVEL (UNFRACTIONATED): HEPARIN UNFRACTIONATED: 0.37 [IU]/mL (ref 0.30–0.70)

## 2016-10-28 LAB — PROCALCITONIN

## 2016-10-28 LAB — MAGNESIUM: MAGNESIUM: 2.5 mg/dL — AB (ref 1.7–2.4)

## 2016-10-28 MED ORDER — POTASSIUM & SODIUM PHOSPHATES 280-160-250 MG PO PACK
2.0000 | PACK | ORAL | Status: AC
  Administered 2016-10-28 (×2): 2 via ORAL
  Filled 2016-10-28 (×2): qty 2

## 2016-10-28 MED ORDER — VITAL HIGH PROTEIN PO LIQD
1000.0000 mL | ORAL | Status: DC
  Administered 2016-10-29: 1000 mL

## 2016-10-28 MED ORDER — PRO-STAT SUGAR FREE PO LIQD
30.0000 mL | Freq: Two times a day (BID) | ORAL | Status: DC
  Administered 2016-10-28 – 2016-10-29 (×4): 30 mL

## 2016-10-28 MED ORDER — VITAL HIGH PROTEIN PO LIQD
1000.0000 mL | ORAL | Status: DC
  Administered 2016-10-28: 1000 mL

## 2016-10-28 MED ORDER — POTASSIUM CHLORIDE 20 MEQ PO PACK
40.0000 meq | PACK | Freq: Once | ORAL | Status: AC
  Administered 2016-10-28: 40 meq via ORAL
  Filled 2016-10-28: qty 2

## 2016-10-28 MED ORDER — AMIODARONE HCL 200 MG PO TABS
200.0000 mg | ORAL_TABLET | Freq: Two times a day (BID) | ORAL | Status: DC
  Administered 2016-10-28 – 2016-10-29 (×3): 200 mg via ORAL
  Filled 2016-10-28 (×3): qty 1

## 2016-10-28 MED ORDER — POTASSIUM CHLORIDE 20 MEQ PO PACK
40.0000 meq | PACK | ORAL | Status: DC
  Filled 2016-10-28: qty 2

## 2016-10-28 MED ORDER — ENOXAPARIN SODIUM 40 MG/0.4ML ~~LOC~~ SOLN
40.0000 mg | SUBCUTANEOUS | Status: DC
  Administered 2016-10-28 – 2016-10-29 (×2): 40 mg via SUBCUTANEOUS
  Filled 2016-10-28 (×2): qty 0.4

## 2016-10-28 NOTE — Progress Notes (Signed)
Discussed with husband, grandson at bedside. He wants to give his wife a chance and continue treatment. Will continue abx, will remain DNR, no invasive measures.   Wells Guiles, M.D. 10/28/2016

## 2016-10-28 NOTE — Progress Notes (Signed)
ANTICOAGULATION CONSULT NOTE - Initial Consult  Pharmacy Consult for Heparin Drip Indication: atrial fibrillation  Allergies  Allergen Reactions  . Other Other (See Comments)    Blood thinners - GI bleed  . Prednisone Other (See Comments)    Causes hair to fall out   . Omeprazole Rash    Patient Measurements: Height: 5\' 3"  (160 cm) Weight: 175 lb 4.3 oz (79.5 kg) IBW/kg (Calculated) : 52.4 Heparin Dosing Weight: 69 kg  Vital Signs: Temp: 98.1 F (36.7 C) (04/05 0400) Temp Source: Core (Comment) (04/05 0400) BP: 121/62 (04/05 0400) Pulse Rate: 94 (04/05 0400)  Labs:  Recent Labs  11/01/16 0758  11/01/2016 1811 November 01, 2016 2301 10/27/16 0540 10/28/16 0503  HGB 11.2*  --   --   --  9.8* 9.2*  HCT 36.1  --   --   --  31.4* 28.8*  PLT 361  --   --   --  273 236  HEPARINUNFRC  --   < >  --  0.55 0.53 0.37  CREATININE 0.82  --   --   --  1.14* 0.93  TROPONINI 0.03*  --  0.05* 0.05* 0.04*  --   < > = values in this interval not displayed.  Estimated Creatinine Clearance: 56.2 mL/min (by C-G formula based on SCr of 0.93 mg/dL).   Medical History: Past Medical History:  Diagnosis Date  . AKI (acute kidney injury) ([facility]) 10/27/2016  . Allergy   . Anxiety   . Arthritis   . GERD (gastroesophageal reflux disease)   . Hypertension   . Hyperthyroidism   . Movement disorder   . Multiple sclerosis ([facility])   . Neuromuscular disorder ([facility])   . PAF (paroxysmal atrial fibrillation) ([facility])   . Restless leg syndrome   . Seizures ([facility])   . Vision abnormalities     Assessment: Patient is a 71yo female admitted for respiratory distress/sepsis. Developed afib and ED physician started patient on Heparin drip at 950 units/hr without a bolus. Hospitalist has now consulted pharmacy for dosing.  Goal of Therapy:  Heparin level 0.3-0.7 units/ml Monitor platelets by anticoagulation protocol: Yes   Plan:  Will continue with Heparin 950 units/hr and check Heparin level in 6 hours. Daily  CBC will be obtained while on Heparin drip.  4/3 1629 HL therapeutic x 1. Continue current rate. Will recheck HL in 6 hours.  4/3 23:00 heparin level 0.55. Continue current regimen. Recheck CBC and heparin level with tomorrow AM labs.  4/4 AM heparin level 0.53. Continue current regimen. Recheck heparin level and CBC with tomorrow AM labs.  4/5 AM heparin level 0.37. Continue current regimen. Recheck heparin level and CBC with tomorrow AM labs.   Fulton Reek, PharmD, BCPS  10/28/16

## 2016-10-28 NOTE — Care Management Note (Signed)
Case Management Note  Patient Details  Name: PORTER NAKAMA MRN: [id number] Date of Birth: 1945/12/25  Subjective/Objective:                    Action/Plan: Patient is open to [facility] services [phone]; their physicians have access to chart review on their clients. I spoke with April case worker with [facility]. April was at hospital today and visited with patient's family. Per April, patient lives with her husband and has home care services provided- over 53 hours. Patient travels to center 3 days per week for PT, etc; Rides in private vehicle.  [facility] will cordinate discharge planning needs with for patient with family.   Expected Discharge Date:                  Expected Discharge Plan:     In-House Referral:     Discharge planning Services  CM Consult  Post Acute Care Choice:    Choice offered to:     DME Arranged:    DME Agency:     HH Arranged:    HH Agency:     Status of Service:  In process, will continue to follow  If discussed at Long Length of Stay Meetings, dates discussed:    Additional Comments:  Collie Siad, RN 10/28/2016, 11:22 AM

## 2016-10-28 NOTE — Progress Notes (Signed)
Nutrition Follow-up  DOCUMENTATION CODES:   Obesity unspecified  INTERVENTION:  -TF: recommend Vital High Protein at rate of 30 ml/hr with Prostat BID providing 920 kcals, 94 g of protein. Continue to assess   NUTRITION DIAGNOSIS:   Inadequate oral intake related to acute illness as evidenced by NPO status.  GOAL:   Patient will meet greater than or equal to 90% of their needs  MONITOR:   Vent status, TF tolerance, Weight trends, Labs  REASON FOR ASSESSMENT:   Ventilator    ASSESSMENT:    71 yo female admitted with acute respiratory failure with suspected RLL pneumonia, pulmonary edema. Pt with hx of advanced MS  Pt remains on vent support Diprivan: 13.9 ml/hr (367 kcals in 24 hours) TF discontinued yesterday but reordered this AM by MD, Vital High Protein at 40 ml/hr  Diet Order:  Diet NPO time specified  Skin:  Reviewed, no issues  Last BM:  no documented BM  Height:   Ht Readings from Last 1 Encounters:  11-01-16 5\' 3"  (1.6 m)    Weight:   Wt Readings from Last 1 Encounters:  10/28/16 181 lb 10.5 oz (82.4 kg)    Ideal Body Weight:  52.3 kg  BMI:  Body mass index is 32.18 kg/m.  Estimated Nutritional Needs:   Kcal:  [phone] kcals  Protein:  105-131 g  Fluid:  >/= 1.4 L  EDUCATION NEEDS:   No education needs identified at this time  Romelle Starcher MS, RD, LDN [phone] Pager  [phone] Weekend/On-Call Pager

## 2016-10-28 NOTE — Progress Notes (Signed)
[facility] - [place] at [facility]   PATIENT NAME: Brandi Gibbs    MR#:  [id number]  DATE OF BIRTH:  1945-10-06  SUBJECTIVE:   Patient came in with increasing shortness of breath and was on BiPAP. Chest x-ray revealed bilateral basilar infiltrates. She got intubated and on the vent. Family in the room REVIEW OF SYSTEMS:   Review of Systems  Unable to perform ROS: Intubated   Tolerating Diet:intubated Tolerating PT: intubated  DRUG ALLERGIES:   Allergies  Allergen Reactions  . Other Other (See Comments)    Blood thinners - GI bleed  . Prednisone Other (See Comments)    Causes hair to fall out   . Omeprazole Rash    VITALS:  Blood pressure 103/65, pulse 83, temperature 97.7 F (36.5 C), resp. rate 15, height 5\' 3"  (1.6 m), weight 82.4 kg (181 lb 10.5 oz), SpO2 96 %.  PHYSICAL EXAMINATION:   Physical Exam  GENERAL:  71 y.o.-year-old patient lying in the bed with no acute distress. Critically ill EYES: Pupils equal, round, reactive to light and accommodation. No scleral icterus. Extraocular muscles intact.  HEENT: Head atraumatic, normocephalic. Oropharynx and nasopharynx clear. Intubated and on the vent NECK:  Supple, no jugular venous distention. No thyroid enlargement, no tenderness.  LUNGS: Normal breath sounds bilaterally, no wheezing, rales, rhonchi. No use of accessory muscles of respiration.  CARDIOVASCULAR: S1, S2 normal. No murmurs, rubs, or gallops.  ABDOMEN: Soft, nontender, nondistended. Bowel sounds present. No organomegaly or mass.  EXTREMITIES: No cyanosis, clubbing or edema b/l.    NEUROLOGIC: on the vent PSYCHIATRIC:  Sedated SKIN: No obvious rash, lesion, or ulcer.   LABORATORY PANEL:  CBC  Recent Labs Lab 10/28/16 0503  WBC 9.7  HGB 9.2*  HCT 28.8*  PLT 236    Chemistries   Recent Labs Lab 10/28/2016 0758  10/28/16 0503  NA 139  < > 140  K 3.9  < > 3.0*  CL 106  < > 111  CO2 21*  < > 24  GLUCOSE 178*  < >  122*  BUN 17  < > 24*  CREATININE 0.82  < > 0.93  CALCIUM 8.9  < > 8.4*  MG  --   < > 2.5*  AST 36  --   --   ALT 26  --   --   ALKPHOS 121  --   --   BILITOT 0.9  --   --   < > = values in this interval not displayed. Cardiac Enzymes  Recent Labs Lab 10/27/16 0540  TROPONINI 0.04*   RADIOLOGY:  Dg Chest 1 View  Result Date: 10/28/2016 CLINICAL DATA:  Shortness of breath. EXAM: CHEST 1 VIEW COMPARISON:  10/27/2016. FINDINGS: Endotracheal tube and NG tube in stable position. Metallic probe noted over the neck. Persistent cardiomegaly. Persistent bilateral pulmonary infiltrates/edema. No interim change. Tiny left pleural effusion cannot be excluded. No pneumothorax . IMPRESSION: 1. Lines and tubes in stable position. 2.  Persistent cardiomegaly with mild pulmonary vascular prominence. 3. Persistent bilateral pulmonary infiltrates/ edema. Tiny left pleural effusion cannot be excluded. No interim change. Electronically Signed   By: Maisie Fus  Register   On: 10/28/2016 06:26   Dg Abd 1 View  Result Date: Oct 28, 2016 CLINICAL DATA:  OG tube placement EXAM: ABDOMEN - 1 VIEW COMPARISON:  None. FINDINGS: Mild cardiomegaly.  Left lower lobe consolidation. Atherosclerosis of the aorta. Esophageal tube tip and side-port project over the proximal stomach. IMPRESSION: 1. Esophageal tube  tip projects over the proximal stomach 2. Left lower lobe consolidation. Electronically Signed   By: Jasmine Pang M.D.   On: 10/25/2016 22:23   Portable Chest Xray  Result Date: 10/27/2016 CLINICAL DATA:  Respiratory failure. EXAM: PORTABLE CHEST 1 VIEW COMPARISON:  11/12/2016 FINDINGS: Endotracheal tube tip is 2.2 cm above the carina. Nasogastric tube extends into the stomach and beyond the inferior edge of the image. No pneumothorax. Hazy airspace opacities persist in the central and basilar regions bilaterally. No large effusions. IMPRESSION: 1.  Support equipment appears satisfactorily positioned. 2. No significant  interval change in the bilateral airspace opacities. Electronically Signed   By: Ellery Plunk M.D.   On: 10/27/2016 05:46   Portable Chest Xray  Result Date: 11/05/2016 CLINICAL DATA:  Intubated EXAM: PORTABLE CHEST 1 VIEW COMPARISON:  11/05/2016 FINDINGS: Cardiomediastinal silhouette is stable. Again noted bilateral asymmetric patchy airspace disease right greater than left without significant change in aeration. Findings probable due to bilateral pneumonia rather than asymmetric pulmonary edema. Endotracheal tube in place with tip 1.8 cm above the carina. No pneumothorax. IMPRESSION: Again noted bilateral patchy asymmetric airspace disease right greater than left suspicious for asymmetric pneumonia. Endotracheal tube in place. No pneumothorax. Electronically Signed   By: Natasha Mead M.D.   On: 11/22/2016 16:59   ASSESSMENT AND PLAN:  Brandi Gibbs  is a 71 y.o. female with a known history of Multiple sclerosis, wheelchair-bound, restless leg syndrome, neurogenic bladder, hyperparathyroidism, hypertension, arthritis, reflux disease, history of atrial fibrillation status post cardioversion and not on anticoagulation presents to hospital secondary to sudden onset of shortness of breath and cough that started last night.  #1 acute hypoxic respiratory failure-secondary to bibasilar pneumonia. Not on home oxygen. -She was on on BiPAP she deteriorated and is now intubated and on the ventilator day 2 -On broad spectrum IV antibiotics with IV cefepime.  #2 sepsis- secondary to bibasilar pneumonia.  -Follow blood cultures negative. -cont IV cefepime at this time.  MRSA PCR negative  #3 atrial fibrillation with rapid ventricular response-known history of chronic atrial fibrillation status post cardioversion. -Likely triggered by respiratory failure. - was on  Cardizem drip.  -on oral metoprolol.  -On heparin drip for anticoagulation.  -appreciate cardiology input  #4 hypertension-blood pressure  responded well with IV fluids. On metoprolol orally and also Cardizem drip now. Continue to hold lisinopril  #5 multiple sclerosis with restless leg syndrome-continue home medications.  -Patient on maintenance Aubagio, also Mirapex for her restless legs. Her main deficits are muscle spasms in lower extremities and also gait issues  #6 DVT prophylaxis-on heparin drip at this time.  Palliative care consultation has been placed. Family to meet this afternoon.  Case discussed with Care Management/Social Worker. Management plans discussed with the patient, family and they are in agreement.  CODE STATUS: DO NOT RESUSCITATE  DVT Prophylaxis: Heparin drip TOTAL TIME TAKING CARE OF THIS PATIENT: 30 minutes.  >50% time spent on counselling and coordination of care husband    Note: This dictation was prepared with Dragon dictation along with smaller phrase technology. Any transcriptional errors that result from this process are unintentional.  Jennetta Flood M.D on 10/28/2016 at 2:55 PM  Between 7am to 6pm - Pager - [phone]  After 6pm go to [email or web address] - Social research officer, government  [facility]  Office  [phone]  CC: Primary care physician; Dorothey Baseman, MD

## 2016-10-28 NOTE — Progress Notes (Signed)
PULMONARY / CRITICAL CARE MEDICINE   Name: Brandi Gibbs MRN: [id number] DOB: November 23, 1945    ADMISSION DATE:  11/01/2016 CONSULTATION DATE:  04/03  REFERRING MD: Nemiah Commander  PT PROFILE: 71 y.o. female former smoker with advanced MS, chronic AF admitted via ED with acute respiratory distress of approx 12 hrs duration, cough, hypoxemia and initial CXR with RLL infiltrate. Failed BiPAP in ED and intubated. Pt has Advanced Directives and does not wish to undergo prolonged ventilation but husband opted for short term intubation if there is something reversible. Post-intubation CXR has appearance of pulmonary edema versus acute lung injury  MAJOR EVENTS/TEST RESULTS: 04/03-Admitted via ED. Intubated after failed BiPAP attempt  INDWELLING DEVICES:: ETT 04/03 >>   MICRO DATA: MRSA PCR 04/03 >>negative Urine 04/03 >>negative   Resp 04/03 >>  Blood 04/03 >>negative    ANTIMICROBIALS:  Vanc 04/03 >>  Cefepime 04/03 >>   Allergies  Allergen Reactions  . Other Other (See Comments)    Blood thinners - GI bleed  . Prednisone Other (See Comments)    Causes hair to fall out   . Omeprazole Rash    No current facility-administered medications on file prior to encounter.    Current Outpatient Prescriptions on File Prior to Encounter  Medication Sig  . acetaminophen (TYLENOL 8 HOUR ARTHRITIS PAIN) 650 MG CR tablet Take 650 mg by mouth 2 (two) times daily.  Marland Kitchen acetaminophen (TYLENOL) 325 MG tablet Take 325 mg by mouth 2 (two) times daily as needed.  . baclofen (LIORESAL) 10 MG tablet Take 1 tablet (10 mg total) by mouth 3 (three) times daily. (Patient taking differently: Take 10 mg by mouth 3 (three) times daily as needed. )  . diltiazem (DILACOR XR) 180 MG 24 hr capsule Take 180 mg by mouth daily.  Marland Kitchen lisinopril (PRINIVIL,ZESTRIL) 40 MG tablet Take 40 mg by mouth 2 (two) times daily.   . methimazole (TAPAZOLE) 10 MG tablet Take 1 tablet (10 mg total) by mouth daily.  . metoprolol tartrate  (LOPRESSOR) 25 MG tablet Take 25 mg by mouth 2 (two) times daily.  Marland Kitchen oxybutynin (DITROPAN) 5 MG tablet TAKE 1 TABLET 3 TIMES DAILY (Patient taking differently: TAKE 1 TABLET 2 TIMES DAILY)  . pramipexole (MIRAPEX) 0.5 MG tablet TAKE 1 TABLET 3 TIMES DAILY (Patient taking differently: TAKE 1 TABLET at bedtime)  . simvastatin (ZOCOR) 40 MG tablet Take 40 mg by mouth at bedtime.   . Teriflunomide 14 MG TABS Take 14 mg by mouth daily.  . Vitamin D, Ergocalciferol, (DRISDOL) 50000 units CAPS capsule Take 50,000 Units by mouth every 30 (thirty) days.    REVIEW OF SYSTEMS:   Unable to obtain as the patient is critically ill and intubated  SUBJECTIVE:  Per RN no acute issues overnight.  VITAL SIGNS: BP 115/72   Pulse 75   Temp 97.7 F (36.5 C)   Resp 15   Ht 5\' 3"  (1.6 m)   Wt 82.4 kg (181 lb 10.5 oz)   SpO2 95%   BMI 32.18 kg/m   HEMODYNAMICS:    VENTILATOR SETTINGS: Vent Mode: PRVC FiO2 (%):  [40 %] 40 % Set Rate:  [15 bmp] 15 bmp Vt Set:  [500 mL] 500 mL PEEP:  [5 cmH20] 5 cmH20 Plateau Pressure:  [12 cmH20] 12 cmH20  INTAKE / OUTPUT: I/O last 3 completed shifts: In: 1212 [I.V.:1212] Out: 1470 [Urine:1470]  PHYSICAL EXAMINATION: General: Chronically ill-appearing, intubated and on mechanical ventillation Neuro: sedated, does not follow commands, withdraws from  painful stimulation,  PERRL HEENT: NCAT, sclerae white, no jvd Cardiovascular: irregular, irregular, no M/R?G Lungs: scattered rhonchi, no wheezes, crackles, rhonchi noted, even, non labored Abdomen: Soft, nontender, diminished bowel sounds Extremities: Warm, trace edema bilateral upper extremities  Skin: warm, dry and intact  LABS:  BMET  Recent Labs Lab 11/11/2016 0758 10/27/16 0540 10/28/16 0503  NA 139 138 140  K 3.9 3.6 3.0*  CL 106 107 111  CO2 21* 20* 24  BUN 17 28* 24*  CREATININE 0.82 1.14* 0.93  GLUCOSE 178* 224* 122*    Electrolytes  Recent Labs Lab 10/27/2016 0758  10/27/16 0540  10/27/16 1425 10/27/16 1645 10/28/16 0503  CALCIUM 8.9  --  8.6*  --   --  8.4*  MG  --   < > 1.5* 1.6* 3.3* 2.5*  PHOS  --   --   --  2.3* 2.4* 2.2*  < > = values in this interval not displayed.  CBC  Recent Labs Lab 10/31/2016 0758 10/27/16 0540 10/28/16 0503  WBC 13.2* 11.3* 9.7  HGB 11.2* 9.8* 9.2*  HCT 36.1 31.4* 28.8*  PLT 361 273 236    Coag's No results for input(s): APTT, INR in the last 168 hours.  Sepsis Markers  Recent Labs Lab 11/09/2016 0758 10/31/2016 1100 11/21/2016 1811 10/27/16 0540 10/28/16 0503  LATICACIDVEN 3.0* 2.1*  --   --   --   PROCALCITON  --   --  <0.10 <0.10 <0.10    ABG  Recent Labs Lab 11/07/2016 0758 11/01/2016 1554 10/28/16 0321  PHART 7.43 7.44 7.45  PCO2ART 32 30* 37  PO2ART 158* 291* 98    Liver Enzymes  Recent Labs Lab 11/02/2016 0758  AST 36  ALT 26  ALKPHOS 121  BILITOT 0.9  ALBUMIN 3.7    Cardiac Enzymes  Recent Labs Lab 11/12/2016 1811 10/28/2016 2301 10/27/16 0540  TROPONINI 0.05* 0.05* 0.04*    Glucose  Recent Labs Lab 10/27/16 1310 10/27/16 1524 10/27/16 2137 10/27/16 2357 10/28/16 0437  GLUCAP 162* 158* 132* 116* 123*    ASSESSMENT / PLAN:  PULMONARY A: Acute hypoxemic respiratory failure Mechanically Intubated Suspect right lower lobe pneumonia - high risk of aspiration pneumonia Pulmonary Edema P:   Vent settings established VAP bundle implemented SBT as indicated Nebulized bronchodilators Intermittent CXR  CARDIOVASCULAR A:  Chronic atrial fibrillation with RVR-improved rate controlled   Borderline hypotension Elevated troponin possibly due to demand ischemia P:  Discontinue heparin gtt  Will transition to cardizem per tube and will attempt to d/c cardizem gtt if heart rate controlled  DNR in event of cardiac arrest  Per family request if pt becomes hypotensive will not order pressors or insert central line  Discontinue LR   RENAL A:   No acute issues P:   Monitor BMET  intermittently Monitor I/Os Correct electrolytes as indicated  GASTROINTESTINAL A:   No acute issues P:   SUP: IV famotidine Will initiate TF- 10/28/16   HEMATOLOGIC A:   Mild chronic anemia without acute blood loss P:  Subq lovenox for VTE prophylaxis  Monitor CBC intermittently Will not administer blood products if needed per family wishes   INFECTIOUS A:   RLL PNA - suspect aspiration P:   Monitor temp, WBC count Continue abx as listed above Follow cultures   ENDOCRINE A:   Hyperglycemia  P:   Sliding scale insulin ordered - sensitive scale CBG q4hr  NEUROLOGIC A:   Advanced MS Acute encephalopathy ICU/ventilator associated discomfort P:  RASS goal: -2, -3 PAD protocol Propofol and Fentanyl gtt to maintain RASS goal  Prn Fentanyl for pain management   FAMILY: Spoke with pts husband and other family members they are not ready to proceed with comfort care measures only.  They would like to continue antibiotics, however they DO NOT want central line placement, blood products or pressor therapy.  She will also remain DO NOT RESUSCITATE palliative care assisting family as well regarding goals of treatment.  Sonda Rumble, AGNP  Pulmonary/Critical Care Pager [phone] (please enter 7 digits) PCCM Consult Pager [phone] (please enter 7 digits)   Patient seen and examined with NP, agree with assessment and plan as amended above. Continued acute respiratory failure, requiring ventilator support, likely secondary to pneumonia. Patient has a background history of multiple sclerosis, likely complicating care. Discussed with palliative care and with family, currently the patient's family wants to continue limited measures and give her more time to see if she gets better. They understand that she had a most form indicating that she was comfort measures only, did not want life support or aggressive therapy, but they feel that she has slightly improved and would like to  give her more time. Therefore, we will continue conservative measures such as antibiotic therapy, but avoid aggressive measures such as a central line placement. CPR other aggressive interventions. Chest x-ray images from 4/5, personally reviewed by me, shows improvement resolution of the patient's previously seen infiltrates.  Wells Guiles, M.D.  10/28/2016

## 2016-10-28 NOTE — Progress Notes (Signed)
MEDICATION RELATED CONSULT NOTE    Pharmacy Consult for electrolyte management   Pharmacy consulted for electrolyte management for 71 yo female requiring mechanical ventilation. Patient received magnesium 4g IV x 1 on 4/5.   Plan:  Will order potassium phosphate 2 packets Q4hr x 2 doses. Will order potassium packet x 1 at 1800.    Will recheck electrolytes with am labs.   Allergies  Allergen Reactions  . Other Other (See Comments)    Blood thinners - GI bleed  . Prednisone Other (See Comments)    Causes hair to fall out   . Omeprazole Rash    Patient Measurements: Height: 5\' 3"  (160 cm) Weight: 181 lb 10.5 oz (82.4 kg) IBW/kg (Calculated) : 52.4  Vital Signs: Temp: 97.7 F (36.5 C) (04/05 1500) Temp Source: Core (Comment) (04/05 1200) BP: 126/65 (04/05 1500) Pulse Rate: 117 (04/05 1500) Intake/Output from previous day: 04/04 0701 - 04/05 0700 In: 601.7 [I.V.:601.7] Out: 1200 [Urine:1200] Intake/Output from this shift: Total I/O In: 539.2 [I.V.:324.5; NG/GT:164.7; IV Piggyback:50] Out: -   Labs:  Recent Labs  11/06/2016 0758  10/27/16 0540 10/27/16 1425 10/27/16 1645 10/28/16 0503  WBC 13.2*  --  11.3*  --   --  9.7  HGB 11.2*  --  9.8*  --   --  9.2*  HCT 36.1  --  31.4*  --   --  28.8*  PLT 361  --  273  --   --  236  CREATININE 0.82  --  1.14*  --   --  0.93  MG  --   < > 1.5* 1.6* 3.3* 2.5*  PHOS  --   --   --  2.3* 2.4* 2.2*  ALBUMIN 3.7  --   --   --   --   --   PROT 7.3  --   --   --   --   --   AST 36  --   --   --   --   --   ALT 26  --   --   --   --   --   ALKPHOS 121  --   --   --   --   --   BILITOT 0.9  --   --   --   --   --   < > = values in this interval not displayed. Estimated Creatinine Clearance: 57.2 mL/min (by C-G formula based on SCr of 0.93 mg/dL).   Pharmacy will continue to monitor and adjust per consult.    Simpson,Michael L 10/28/2016,4:10 PM

## 2016-10-28 NOTE — Progress Notes (Addendum)
Pharmacy Antibiotic Note  Brandi Gibbs is a 71 y.o. female admitted on 11-21-2016 with sepsis and respiratory distress.  Pharmacy has been consulted for Vancomycin and Cefepime dosing. Received Vancomycin 1g IV and Zosyn 3.375g IV in the ED.  Plan: Will continue cefepime 2g IV Q12hr.   Vancomycin discontinued on am ICU rounds.   Will obtain follow up creatinine with am labs.   Height: 5\' 3"  (160 cm) Weight: 181 lb 10.5 oz (82.4 kg) IBW/kg (Calculated) : 52.4  Temp (24hrs), Avg:97.9 F (36.6 C), Min:97.3 F (36.3 C), Max:98.8 F (37.1 C)   Recent Labs Lab Nov 21, 2016 0758 2016-11-21 1100 10/27/16 0540 10/28/16 0503  WBC 13.2*  --  11.3* 9.7  CREATININE 0.82  --  1.14* 0.93  LATICACIDVEN 3.0* 2.1*  --   --     Estimated Creatinine Clearance: 57.2 mL/min (by C-G formula based on SCr of 0.93 mg/dL).    Allergies  Allergen Reactions  . Other Other (See Comments)    Blood thinners - GI bleed  . Prednisone Other (See Comments)    Causes hair to fall out   . Omeprazole Rash    Antimicrobials this admission: Zosyn  4/3 x 1 dose Vancomycin 4/3 >> 4/5   Cefepime 4/3 >>  Dose adjustments this admission: 4/4 cefepime transitioned to 2g IV Q12hr and vancomycin transitioned to 1g IV q24hr.   Microbiology results: 4/3 BCx: no growth x 2 days 4/3 UCx: no growth  4/3 Sputum: no growth < 12h 4/3 MRSA PCR: negative   Pharmacy will continue to monitor and adjust per consult.   Thaddeus Evitts L 10/28/2016 4:07 PM

## 2016-10-28 NOTE — Progress Notes (Signed)
Progress Note  Patient Name: Brandi Gibbs Date of Encounter: 10/28/2016  Primary Cardiologist: Esmond Plants, [facility]  Subjective   Intubated, sedated, no events overnight, Discussed cardiac rhythm with family at the bedside 40% FiO2 on the ventilator   Inpatient Medications    Scheduled Meds: . amiodarone  200 mg Oral BID  . ceFEPime (MAXIPIME) IV  2 g Intravenous Q24H  . chlorhexidine gluconate (MEDLINE KIT)  15 mL Mouth Rinse BID  . enoxaparin (LOVENOX) injection  40 mg Subcutaneous Q24H  . famotidine  20 mg Per Tube Daily  . feeding supplement (PRO-STAT SUGAR FREE 64)  30 mL Per Tube BID  . [START ON 10/29/2016] feeding supplement (VITAL HIGH PROTEIN)  1,000 mL Per Tube Q24H  . insulin aspart  0-15 Units Subcutaneous Q4H  . ipratropium-albuterol  3 mL Nebulization Q6H  . mouth rinse  15 mL Mouth Rinse QID  . methimazole  10 mg Oral Daily  . potassium chloride  40 mEq Oral Once  . pramipexole  0.5 mg Oral QHS  . sodium chloride flush  3 mL Intravenous Q12H   Continuous Infusions: . propofol (DIPRIVAN) infusion 30 mcg/kg/min (10/28/16 1502)   PRN Meds: acetaminophen **OR** acetaminophen, fentaNYL (SUBLIMAZE) injection, succinylcholine   Vital Signs    Vitals:   10/28/16 1400 10/28/16 1500 10/28/16 1506 10/28/16 1600  BP: 103/65 126/65  (!) 115/57  Pulse: 83 (!) 117  (!) 104  Resp: '15 16  16  '$ Temp: 97.7 F (36.5 C) 97.7 F (36.5 C)  97.9 F (36.6 C)  TempSrc:      SpO2: 96% 99% 99% 98%  Weight:      Height:        Intake/Output Summary (Last 24 hours) at 10/28/16 1714 Last data filed at 10/28/16 1440  Gross per 24 hour  Intake          1140.87 ml  Output              950 ml  Net           190.87 ml   Filed Weights   11/05/2016 0837 10/27/16 0422 10/28/16 0600  Weight: 170 lb (77.1 kg) 175 lb 4.3 oz (79.5 kg) 181 lb 10.5 oz (82.4 kg)    Telemetry    Atrial fibrillation rate in the 90s- Personally Reviewed   Physical Exam    GEN: Intubated, appears  comfortable Neck: No JVD  Cardiac: Irregularly irregular, no murmurs, rubs, or gallops.  Radials/DP/PT 2+ and equal bilaterally.  Respiratory:  Coarse breath sounds bilaterally, GI: Soft, non-distended  MS: no deformity; no edema Neuro:  Sedated, intubated  Labs    Chemistry Recent Labs Lab 10/24/2016 0758 10/27/16 0540 10/28/16 0503  NA 139 138 140  K 3.9 3.6 3.0*  CL 106 107 111  CO2 21* 20* 24  GLUCOSE 178* 224* 122*  BUN 17 28* 24*  CREATININE 0.82 1.14* 0.93  CALCIUM 8.9 8.6* 8.4*  PROT 7.3  --   --   ALBUMIN 3.7  --   --   AST 36  --   --   ALT 26  --   --   ALKPHOS 121  --   --   BILITOT 0.9  --   --   GFRNONAA >60 48* >60  GFRAA >60 55* >60  ANIONGAP '12 11 5     '$ Hematology Recent Labs Lab 10/28/2016 0758 10/27/16 0540 10/28/16 0503  WBC 13.2* 11.3* 9.7  RBC 4.86 4.24  3.86  HGB 11.2* 9.8* 9.2*  HCT 36.1 31.4* 28.8*  MCV 74.3* 74.0* 74.8*  MCH 23.0* 23.1* 23.8*  MCHC 31.0* 31.3* 31.8*  RDW 18.6* 18.8* 18.1*  PLT 361 273 236    Cardiac Enzymes Recent Labs Lab 11/06/2016 0758 11/08/2016 1811 11/17/2016 2301 10/27/16 0540  TROPONINI 0.03* 0.05* 0.05* 0.04*   No results for input(s): TROPIPOC in the last 168 hours.   BNP Recent Labs Lab 11/14/2016 0758 10/29/2016 1811  BNP 779.0* 680.0*     DDimer No results for input(s): DDIMER in the last 168 hours.   Radiology    Dg Chest 1 View  Result Date: 10/28/2016 CLINICAL DATA:  Shortness of breath. EXAM: CHEST 1 VIEW COMPARISON:  10/27/2016. FINDINGS: Endotracheal tube and NG tube in stable position. Metallic probe noted over the neck. Persistent cardiomegaly. Persistent bilateral pulmonary infiltrates/edema. No interim change. Tiny left pleural effusion cannot be excluded. No pneumothorax . IMPRESSION: 1. Lines and tubes in stable position. 2.  Persistent cardiomegaly with mild pulmonary vascular prominence. 3. Persistent bilateral pulmonary infiltrates/ edema. Tiny left pleural effusion cannot be excluded.  No interim change. Electronically Signed   By: Marcello Moores  Register   On: 10/28/2016 06:26   Dg Abd 1 View  Result Date: 11/20/2016 CLINICAL DATA:  OG tube placement EXAM: ABDOMEN - 1 VIEW COMPARISON:  None. FINDINGS: Mild cardiomegaly.  Left lower lobe consolidation. Atherosclerosis of the aorta. Esophageal tube tip and side-port project over the proximal stomach. IMPRESSION: 1. Esophageal tube tip projects over the proximal stomach 2. Left lower lobe consolidation. Electronically Signed   By: Donavan Foil M.D.   On: 11/15/2016 22:23   Portable Chest Xray  Result Date: 10/27/2016 CLINICAL DATA:  Respiratory failure. EXAM: PORTABLE CHEST 1 VIEW COMPARISON:  11/17/2016 FINDINGS: Endotracheal tube tip is 2.2 cm above the carina. Nasogastric tube extends into the stomach and beyond the inferior edge of the image. No pneumothorax. Hazy airspace opacities persist in the central and basilar regions bilaterally. No large effusions. IMPRESSION: 1.  Support equipment appears satisfactorily positioned. 2. No significant interval change in the bilateral airspace opacities. Electronically Signed   By: Andreas Newport M.D.   On: 10/27/2016 05:46    Cardiac Studies   Echocardiogram showing normal LV function, ejection fraction 60-65% Date of study 11/08/2016  Patient Profile     71 y.o. female  with numerous medical issues including paroxysmal atrial fibrillation, remote cardioversion, carotid disease with carotid endarterectomy on the right, cognitive disorder, hyperthyroidism on methimazole, presenting with shortness of breath, respiratory failure   Assessment & Plan    ----Atrial fibrillation with RVR Timing unclear though rapid rate likely exacerbated by underlying respiratory distress, possible pneumonia Currently on diltiazem 5 mg/h, down from 10 mg yesterday Dramatic improvement in her rate overall On low-dose amiodarone 200 mg by mouth twice a day -Consider starting heparin infusion as atrial  fibrillation is persistent -Potentially could stop the amiodarone now that heart rate has improved, continue diltiazem. She will require one month anticoagulation prior to restoring normal sinus rhythm unless she undergoes TEE cardioversion   ----Acute respiratory distress underlying pneumonia, On the ventilator normal ejection fraction, minimal valve disease, high normal RVSP She is on broad-spectrum antibiotics  ----Sepsis Secondary to pneumonia Blood pressure improving stable on sedation medication and diltiazem  -----Hyperthyroidism Would continue methimazole   Total encounter time more than 25 minutes  Greater than 50% was spent in counseling and coordination of care with the patient   Signed, Ida Rogue, MD  10/28/2016, 5:14 PM

## 2016-10-28 NOTE — Progress Notes (Addendum)
I met with the family at bedside.  They are relieved as they have decided not to extubate this morning, but rather treat her pneumonia and see if she improves.  The patient's two sons and her husband agreed to honor her MOST form (comfort measures only) yesterday afternoon and had planned to extubate without re-intubation this morning.   This morning the patient's grandson objected to extubation and the plan was changed to treat the pneumonia for 4-5 days.  At this point the family has no questions.  I promised to touch base again this afternoon with the family.  I discussed the case with the CCM MD.  The plan is for no invasive measures.  No central line, no pressors.  She will receive feeds through the OG tube.   Do not escalate care.  Recommendation:  PCCM and PMT to jointly set a reasonable time limited trial with the family with specific time frame and specific measurements.  It is much easier to set expectations in advance.  For example something like -   We should be able to start weaning her over the weekend and plan to extubate on Sunday or Monday.  If we are unable to do this we will need to consider comfort care.  Total time 25 min   Imogene Burn, [place] [facility] Pager: [phone]

## 2016-10-29 DIAGNOSIS — G35 Multiple sclerosis: Secondary | ICD-10-CM

## 2016-10-29 DIAGNOSIS — I4891 Unspecified atrial fibrillation: Secondary | ICD-10-CM

## 2016-10-29 DIAGNOSIS — J690 Pneumonitis due to inhalation of food and vomit: Secondary | ICD-10-CM

## 2016-10-29 DIAGNOSIS — R6521 Severe sepsis with septic shock: Secondary | ICD-10-CM

## 2016-10-29 DIAGNOSIS — A419 Sepsis, unspecified organism: Principal | ICD-10-CM

## 2016-10-29 DIAGNOSIS — J9601 Acute respiratory failure with hypoxia: Secondary | ICD-10-CM

## 2016-10-29 LAB — PROTIME-INR
INR: 1.1
Prothrombin Time: 14.2 seconds (ref 11.4–15.2)

## 2016-10-29 LAB — BASIC METABOLIC PANEL
Anion gap: 6 (ref 5–15)
BUN: 27 mg/dL — AB (ref 6–20)
CHLORIDE: 110 mmol/L (ref 101–111)
CO2: 25 mmol/L (ref 22–32)
Calcium: 8.4 mg/dL — ABNORMAL LOW (ref 8.9–10.3)
Creatinine, Ser: 0.61 mg/dL (ref 0.44–1.00)
GFR calc Af Amer: >60 mL/min (ref >60)
GFR calc non Af Amer: >60 mL/min (ref >60)
GLUCOSE: 121 mg/dL — AB (ref 65–99)
POTASSIUM: 4 mmol/L (ref 3.5–5.1)
Sodium: 141 mmol/L (ref 135–145)

## 2016-10-29 LAB — CBC
HCT: 27.6 % — ABNORMAL LOW (ref 35.0–47.0)
Hemoglobin: 8.7 g/dL — ABNORMAL LOW (ref 12.0–16.0)
MCH: 23.7 pg — ABNORMAL LOW (ref 26.0–34.0)
MCHC: 31.6 g/dL — ABNORMAL LOW (ref 32.0–36.0)
MCV: 74.9 fL — AB (ref 80.0–100.0)
PLATELETS: 221 10*3/uL (ref 150–440)
RBC: 3.69 MIL/uL — ABNORMAL LOW (ref 3.80–5.20)
RDW: 19 % — AB (ref 11.5–14.5)
WBC: 8.2 10*3/uL (ref 3.6–11.0)

## 2016-10-29 LAB — GLUCOSE, CAPILLARY
GLUCOSE-CAPILLARY: 112 mg/dL — AB (ref 65–99)
GLUCOSE-CAPILLARY: 123 mg/dL — AB (ref 65–99)
GLUCOSE-CAPILLARY: 127 mg/dL — AB (ref 65–99)
Glucose-Capillary: 104 mg/dL — ABNORMAL HIGH (ref 65–99)
Glucose-Capillary: 124 mg/dL — ABNORMAL HIGH (ref 65–99)
Glucose-Capillary: 159 mg/dL — ABNORMAL HIGH (ref 65–99)

## 2016-10-29 LAB — CULTURE, RESPIRATORY W GRAM STAIN: Culture: NORMAL

## 2016-10-29 LAB — HEPARIN LEVEL (UNFRACTIONATED): Heparin Unfractionated: <0.1 IU/mL — ABNORMAL LOW (ref 0.30–0.70)

## 2016-10-29 LAB — MAGNESIUM: Magnesium: 1.8 mg/dL (ref 1.7–2.4)

## 2016-10-29 LAB — CULTURE, RESPIRATORY

## 2016-10-29 LAB — PHOSPHORUS: PHOSPHORUS: 2.8 mg/dL (ref 2.5–4.6)

## 2016-10-29 LAB — TRIGLYCERIDES: Triglycerides: 63 mg/dL (ref <150)

## 2016-10-29 LAB — APTT: aPTT: 34 seconds (ref 24–36)

## 2016-10-29 MED ORDER — HEPARIN (PORCINE) IN NACL 100-0.45 UNIT/ML-% IJ SOLN
1100.0000 [IU]/h | INTRAMUSCULAR | Status: DC
  Administered 2016-10-29: 950 [IU]/h via INTRAVENOUS
  Filled 2016-10-29: qty 250

## 2016-10-29 MED ORDER — DILTIAZEM 12 MG/ML ORAL SUSPENSION
60.0000 mg | Freq: Two times a day (BID) | ORAL | Status: DC
  Administered 2016-10-29: 60 mg
  Filled 2016-10-29 (×2): qty 6

## 2016-10-29 MED ORDER — DILTIAZEM HCL 30 MG PO TABS
30.0000 mg | ORAL_TABLET | Freq: Four times a day (QID) | ORAL | Status: DC
  Administered 2016-10-29 (×2): 30 mg via ORAL
  Filled 2016-10-29 (×2): qty 1

## 2016-10-29 MED ORDER — METOPROLOL TARTRATE 5 MG/5ML IV SOLN
2.5000 mg | INTRAVENOUS | Status: DC | PRN
  Administered 2016-10-29: 5 mg via INTRAVENOUS
  Administered 2016-10-29: 2.5 mg via INTRAVENOUS
  Administered 2016-10-29 (×2): 5 mg via INTRAVENOUS
  Filled 2016-10-29 (×3): qty 5

## 2016-10-29 MED ORDER — DEXMEDETOMIDINE HCL IN NACL 400 MCG/100ML IV SOLN
0.4000 ug/kg/h | INTRAVENOUS | Status: DC
  Administered 2016-10-29: 1 ug/kg/h via INTRAVENOUS
  Administered 2016-10-29: 0.5 ug/kg/h via INTRAVENOUS
  Administered 2016-10-29: 1 ug/kg/h via INTRAVENOUS
  Administered 2016-10-30: 1.1 ug/kg/h via INTRAVENOUS
  Administered 2016-10-30: 1.2 ug/kg/h via INTRAVENOUS
  Filled 2016-10-29 (×5): qty 100

## 2016-10-29 MED ORDER — FUROSEMIDE 10 MG/ML IJ SOLN
20.0000 mg | Freq: Once | INTRAMUSCULAR | Status: AC
  Administered 2016-10-29: 20 mg via INTRAVENOUS
  Filled 2016-10-29: qty 2

## 2016-10-29 MED ORDER — AMIODARONE HCL IN DEXTROSE 360-4.14 MG/200ML-% IV SOLN
30.0000 mg/h | INTRAVENOUS | Status: DC
  Administered 2016-10-30 (×2): 30 mg/h via INTRAVENOUS
  Filled 2016-10-29 (×2): qty 200

## 2016-10-29 MED ORDER — DILTIAZEM HCL 25 MG/5ML IV SOLN
10.0000 mg | Freq: Once | INTRAVENOUS | Status: AC
  Administered 2016-10-29: 10 mg via INTRAVENOUS
  Filled 2016-10-29: qty 5

## 2016-10-29 MED ORDER — DILTIAZEM HCL 30 MG PO TABS: 30.0000 mg | ORAL_TABLET | Freq: Four times a day (QID) | ORAL | Status: DC

## 2016-10-29 MED ORDER — DILTIAZEM LOAD VIA INFUSION
10.0000 mg | Freq: Once | INTRAVENOUS | Status: DC
  Filled 2016-10-29: qty 10

## 2016-10-29 MED ORDER — AMIODARONE HCL IN DEXTROSE 360-4.14 MG/200ML-% IV SOLN
60.0000 mg/h | INTRAVENOUS | Status: DC
  Administered 2016-10-29 (×2): 60 mg/h via INTRAVENOUS
  Filled 2016-10-29: qty 200

## 2016-10-29 MED ORDER — AMIODARONE LOAD VIA INFUSION
150.0000 mg | Freq: Once | INTRAVENOUS | Status: AC
Start: 2016-10-29 — End: 2016-10-29
  Administered 2016-10-29: 150 mg via INTRAVENOUS
  Filled 2016-10-29: qty 83.34

## 2016-10-29 NOTE — Progress Notes (Addendum)
Progress Note  Patient Name: Brandi Gibbs Date of Encounter: 10/29/2016  Primary Cardiologist: Esmond Plants, [facility]  Subjective   Intubated, sedated, no events overnight, Discussed cardiac rhythm with family at the bedside 30% FiO2 on the ventilator, Improved from 40%Yesterday Blood pressure continues to improve Heart rate higher on telemetry 110 up to 130 bpm, atrial fibrillation Started on diltiazem by mouth, on low-dose amiodarone   Inpatient Medications    Scheduled Meds: . amiodarone  200 mg Oral BID  . ceFEPime (MAXIPIME) IV  2 g Intravenous Q24H  . chlorhexidine gluconate (MEDLINE KIT)  15 mL Mouth Rinse BID  . diltiazem  30 mg Oral Q6H  . enoxaparin (LOVENOX) injection  40 mg Subcutaneous Q24H  . famotidine  20 mg Per Tube Daily  . feeding supplement (PRO-STAT SUGAR FREE 64)  30 mL Per Tube BID  . feeding supplement (VITAL HIGH PROTEIN)  1,000 mL Per Tube Q24H  . insulin aspart  0-15 Units Subcutaneous Q4H  . ipratropium-albuterol  3 mL Nebulization Q6H  . mouth rinse  15 mL Mouth Rinse QID  . methimazole  10 mg Oral Daily  . pramipexole  0.5 mg Oral QHS  . sodium chloride flush  3 mL Intravenous Q12H   Continuous Infusions: . dexmedetomidine (PRECEDEX) IV infusion 0.7 mcg/kg/hr (10/29/16 1306)  . propofol (DIPRIVAN) infusion Stopped (10/29/16 1306)   PRN Meds: acetaminophen **OR** acetaminophen, fentaNYL (SUBLIMAZE) injection, metoprolol, succinylcholine   Vital Signs    Vitals:   10/29/16 1010 10/29/16 1100 10/29/16 1129 10/29/16 1200  BP: (!) 138/91 110/79 130/85 94/63  Pulse:  (!) 121 (!) 127 (!) 104  Resp:  (!) 24 (!) 22 15  Temp:  99.7 F (37.6 C) 100.2 F (37.9 C) (!) 100.4 F (38 C)  TempSrc:      SpO2:  96% 95% 95%  Weight:      Height:        Intake/Output Summary (Last 24 hours) at 10/29/16 1435 Last data filed at 10/29/16 1306  Gross per 24 hour  Intake          1507.05 ml  Output             1420 ml  Net            87.05 ml    Filed Weights   10/27/16 0422 10/28/16 0600 10/29/16 0132  Weight: 175 lb 4.3 oz (79.5 kg) 181 lb 10.5 oz (82.4 kg) 181 lb 14.1 oz (82.5 kg)    Telemetry    Atrial fibrillation rate in the 110 to 130s- Personally Reviewed   Physical Exam    GEN: Intubated, appears comfortable Neck: No JVD  Cardiac: Irregularly irregular, no murmurs, rubs, or gallops.  Radials/DP/PT 2+ and equal bilaterally.  Respiratory:  Coarse breath sounds bilaterally, GI: Soft, non-distended  MS: no deformity; no edema Neuro:  Sedated, intubated  Labs    Chemistry  Recent Labs Lab 11/20/2016 0758 10/27/16 0540 10/28/16 0503 10/29/16 0612  NA 139 138 140 141  K 3.9 3.6 3.0* 4.0  CL 106 107 111 110  CO2 21* 20* 24 25  GLUCOSE 178* 224* 122* 121*  BUN 17 28* 24* 27*  CREATININE 0.82 1.14* 0.93 0.61  CALCIUM 8.9 8.6* 8.4* 8.4*  PROT 7.3  --   --   --   ALBUMIN 3.7  --   --   --   AST 36  --   --   --   ALT 26  --   --   --  ALKPHOS 121  --   --   --   BILITOT 0.9  --   --   --   GFRNONAA >60 48* >60 >60  GFRAA >60 55* >60 >60  ANIONGAP _0 Hematology  Recent Labs Lab 10/27/16 0540 10/28/16 0503 10/29/16 0612  WBC 11.3* 9.7 8.2  RBC 4.24 3.86 3.69*  HGB 9.8* 9.2* 8.7*  HCT 31.4* 28.8* 27.6*  MCV 74.0* 74.8* 74.9*  MCH 23.1* 23.8* 23.7*  MCHC 31.3* 31.8* 31.6*  RDW 18.8* 18.1* 19.0*  PLT 273 236 221    Cardiac Enzymes  Recent Labs Lab 11/14/2016 0758 11/13/2016 1811 11/16/2016 2301 10/27/16 0540  TROPONINI 0.03* 0.05* 0.05* 0.04*   No results for input(s): TROPIPOC in the last 168 hours.   BNP  Recent Labs Lab 11/07/2016 0758 11/09/2016 1811  BNP 779.0* 680.0*     DDimer No results for input(s): DDIMER in the last 168 hours.   Radiology    Dg Chest 1 View  Result Date: 10/28/2016 CLINICAL DATA:  Shortness of breath. EXAM: CHEST 1 VIEW COMPARISON:  10/27/2016. FINDINGS: Endotracheal tube and NG tube in stable position. Metallic probe noted over the neck.  Persistent cardiomegaly. Persistent bilateral pulmonary infiltrates/edema. No interim change. Tiny left pleural effusion cannot be excluded. No pneumothorax . IMPRESSION: 1. Lines and tubes in stable position. 2.  Persistent cardiomegaly with mild pulmonary vascular prominence. 3. Persistent bilateral pulmonary infiltrates/ edema. Tiny left pleural effusion cannot be excluded. No interim change. Electronically Signed   By: Marcello Moores  Register   On: 10/28/2016 06:26    Cardiac Studies   Echocardiogram showing normal LV function, ejection fraction 60-65% Date of study 11/17/2016  Patient Profile     71 y.o. female  with numerous medical issues including paroxysmal atrial fibrillation, remote cardioversion, carotid disease with carotid endarterectomy on the right, cognitive disorder, hyperthyroidism on methimazole, presenting with shortness of breath, respiratory failure   Assessment & Plan    ----Atrial fibrillation with RVR Normal LV function Rate dramatically increased compared to yesterday off diltiazem infusion Critical care team starting oral diltiazem today Also on low-dose oral amiodarone -Consider starting heparin infusion as atrial fibrillation is persistent If continued difficulty controlling heart rate, would reconsider putting back on diltiazem infusion If family wanted aggressive care, TEE cardioversion To restore normal sinus rhythm could be coordinated  ----Acute respiratory distress Possible pneumonia, On the ventilator FiO2 Down to 30 normal ejection fraction, minimal valve disease, high normal RVSP on broad-spectrum antibiotics -Would consider giving Lasix IV given edema on chest x-ray May very well be possible to extubate given improvement  ----Sepsis Secondary to pneumonia Blood pressure improving stable on sedation medication and diltiazem  -----Hyperthyroidism Would continue methimazole  Case discussed with critical Care team Long discussion with husband  at the bedside  Total encounter time more than 35 minutes  Greater than 50% was spent in counseling and coordination of care with the patient   Signed, Ida Rogue, MD  10/29/2016, 2:35 PM

## 2016-10-29 NOTE — Progress Notes (Signed)
RN made Dr. Nicholos Johns aware that patient is working to breath and RR 38-47/min on ventilator therefore propofol drip titrated up.  MD stated to start precedex drip and then try to titrate the propofol drip down.

## 2016-10-29 NOTE — Progress Notes (Signed)
ANTICOAGULATION CONSULT NOTE - Initial Consult  Pharmacy Consult for heparin drip Indication: atrial fibrillation  Allergies  Allergen Reactions  . Other Other (See Comments)    Blood thinners - GI bleed  . Prednisone Other (See Comments)    Causes hair to fall out   . Omeprazole Rash    Patient Measurements: Height: '5\' 3"'$  (160 cm) Weight: 181 lb 14.1 oz (82.5 kg) IBW/kg (Calculated) : 52.4 Heparin Dosing Weight: 69 kg  Vital Signs: Temp: 100.9 F (38.3 C) (04/06 2000) BP: 128/94 (04/06 2000) Pulse Rate: 112 (04/06 2000)  Labs:  Recent Labs  10/29/2016 2301  10/27/16 0540 10/28/16 0503 10/29/16 0612  HGB  --   < > 9.8* 9.2* 8.7*  HCT  --   --  31.4* 28.8* 27.6*  PLT  --   --  273 236 221  HEPARINUNFRC 0.55  --  0.53 0.37 <0.10*  CREATININE  --   --  1.14* 0.93 0.61  TROPONINI 0.05*  --  0.04*  --   --   < > = values in this interval not displayed.  Estimated Creatinine Clearance: 66.5 mL/min (by C-G formula based on SCr of 0.61 mg/dL).   Medical History: Past Medical History:  Diagnosis Date  . AKI (acute kidney injury) ([place]) 10/27/2016  . Allergy   . Anxiety   . Arthritis   . GERD (gastroesophageal reflux disease)   . Hypertension   . Hyperthyroidism   . Movement disorder   . Multiple sclerosis ([place])   . Neuromuscular disorder ([place])   . PAF (paroxysmal atrial fibrillation) ([place])   . Restless leg syndrome   . Seizures ([place])   . Vision abnormalities     Medications:  Scheduled:  . ceFEPime (MAXIPIME) IV  2 g Intravenous Q24H  . chlorhexidine gluconate (MEDLINE KIT)  15 mL Mouth Rinse BID  . diltiazem  60 mg Per Tube Q12H  . famotidine  20 mg Per Tube Daily  . feeding supplement (PRO-STAT SUGAR FREE 64)  30 mL Per Tube BID  . feeding supplement (VITAL HIGH PROTEIN)  1,000 mL Per Tube Q24H  . insulin aspart  0-15 Units Subcutaneous Q4H  . ipratropium-albuterol  3 mL Nebulization Q6H  . mouth rinse  15 mL Mouth Rinse QID  . methimazole  10 mg Oral  Daily  . pramipexole  0.5 mg Oral QHS  . sodium chloride flush  3 mL Intravenous Q12H   Infusions:  . amiodarone 60 mg/hr (10/29/16 1944)   Followed by  . [START ON 11/07/2016] amiodarone    . dexmedetomidine (PRECEDEX) IV infusion 1 mcg/kg/hr (10/29/16 1607)  . heparin    . propofol (DIPRIVAN) infusion Stopped (10/29/16 1306)    Assessment: Pharmacy has been consulted to dose heparin in this 49 yoF with afib. Patient was receiving heparin for afib which was discontinued on 4/5. Patient was previously therapeutic without initial bolus at 950 units/hour.  Goal of Therapy:  Heparin level 0.3-0.7 units/ml Monitor platelets by anticoagulation protocol: Yes   Plan:  Give 0 units bolus x 1 Start heparin infusion at 950 units/hr Check anti-Xa level in 6 hours and daily while on heparin Continue to monitor H&H and platelets  Darrow Bussing, PharmD Pharmacy Resident 10/29/2016 8:24 PM

## 2016-10-29 NOTE — Progress Notes (Signed)
PULMONARY / CRITICAL CARE MEDICINE   Name: Brandi Gibbs MRN: [id number] DOB: 1945-07-29    ADMISSION DATE:  11/21/2016   PT PROFILE: 71 y.o. female former smoker with advanced MS, chronic AF admitted via ED with acute respiratory distress of approx 12 hrs duration, cough, hypoxemia and initial CXR with RLL infiltrate. Failed BiPAP in ED and intubated. Pt has Advanced Directives and does not wish to undergo prolonged ventilation but husband opted for short term intubation if there is something reversible. Post-intubation CXR has appearance of pulmonary edema versus acute lung injury  MAJOR EVENTS/TEST RESULTS: 04/03-Admitted via ED. Intubated after failed BiPAP attempt  INDWELLING DEVICES:: ETT 04/03 >>   MICRO DATA: MRSA PCR 04/03 >>negative Urine 04/03 >>negative   Resp 04/03 >>  Blood 04/03 >>negative    ANTIMICROBIALS:  Vanc 04/03 >> 4/5 Cefepime 04/03 >>   Allergies  Allergen Reactions  . Other Other (See Comments)    Blood thinners - GI bleed  . Prednisone Other (See Comments)    Causes hair to fall out   . Omeprazole Rash    No current facility-administered medications on file prior to encounter.    Current Outpatient Prescriptions on File Prior to Encounter  Medication Sig  . acetaminophen (TYLENOL 8 HOUR ARTHRITIS PAIN) 650 MG CR tablet Take 650 mg by mouth 2 (two) times daily.  Marland Kitchen acetaminophen (TYLENOL) 325 MG tablet Take 325 mg by mouth 2 (two) times daily as needed.  . baclofen (LIORESAL) 10 MG tablet Take 1 tablet (10 mg total) by mouth 3 (three) times daily. (Patient taking differently: Take 10 mg by mouth 3 (three) times daily as needed. )  . diltiazem (DILACOR XR) 180 MG 24 hr capsule Take 180 mg by mouth daily.  Marland Kitchen lisinopril (PRINIVIL,ZESTRIL) 40 MG tablet Take 40 mg by mouth 2 (two) times daily.   . methimazole (TAPAZOLE) 10 MG tablet Take 1 tablet (10 mg total) by mouth daily.  . metoprolol tartrate (LOPRESSOR) 25 MG tablet Take 25 mg by mouth 2 (two)  times daily.  Marland Kitchen oxybutynin (DITROPAN) 5 MG tablet TAKE 1 TABLET 3 TIMES DAILY (Patient taking differently: TAKE 1 TABLET 2 TIMES DAILY)  . pramipexole (MIRAPEX) 0.5 MG tablet TAKE 1 TABLET 3 TIMES DAILY (Patient taking differently: TAKE 1 TABLET at bedtime)  . simvastatin (ZOCOR) 40 MG tablet Take 40 mg by mouth at bedtime.   . Teriflunomide 14 MG TABS Take 14 mg by mouth daily.  . Vitamin D, Ergocalciferol, (DRISDOL) 50000 units CAPS capsule Take 50,000 Units by mouth every 30 (thirty) days.    REVIEW OF SYSTEMS:   Unable to obtain as the patient is critically ill and intubated  SUBJECTIVE:  Per RN no acute issues overnight.  VITAL SIGNS: BP 123/82   Pulse (!) 122   Temp 99.3 F (37.4 C)   Resp (!) 21   Ht 5\' 3"  (1.6 m)   Wt 181 lb 14.1 oz (82.5 kg)   SpO2 96%   BMI 32.22 kg/m   HEMODYNAMICS:    VENTILATOR SETTINGS: Vent Mode: PRVC FiO2 (%):  [30 %-40 %] 30 % Set Rate:  [15 bmp] 15 bmp Vt Set:  [500 mL-5001 mL] 5001 mL PEEP:  [5 cmH20] 5 cmH20 Plateau Pressure:  [18 cmH20] 18 cmH20  INTAKE / OUTPUT: I/O last 3 completed shifts: In: 1814.9 [I.V.:1086.9; NG/GT:678; IV Piggyback:50] Out: 2220 [Urine:2220]  PHYSICAL EXAMINATION: General: Chronically ill-appearing, intubated and on mechanical ventillation minimally responsive.  Neuro: sedated, does not follow  commands, withdraws from painful stimulation,  PERRL HEENT: NCAT, sclerae white, no jvd Cardiovascular: irregular, irregular, no M/R?G Lungs: scattered rhonchi, no wheezes, crackles, rhonchi noted, even, non labored Abdomen: Soft, nontender, diminished bowel sounds Extremities: Warm, trace edema bilateral upper extremities  Skin: warm, dry and intact  LABS:  BMET  Recent Labs Lab 10/27/16 0540 10/28/16 0503 10/29/16 0612  NA 138 140 141  K 3.6 3.0* 4.0  CL 107 111 110  CO2 20* 24 25  BUN 28* 24* 27*  CREATININE 1.14* 0.93 0.61  GLUCOSE 224* 122* 121*    Electrolytes  Recent Labs Lab  10/27/16 0540  10/27/16 1645 10/28/16 0503 10/29/16 0612  CALCIUM 8.6*  --   --  8.4* 8.4*  MG 1.5*  < > 3.3* 2.5* 1.8  PHOS  --   < > 2.4* 2.2* 2.8  < > = values in this interval not displayed.  CBC  Recent Labs Lab 10/27/16 0540 10/28/16 0503 10/29/16 0612  WBC 11.3* 9.7 8.2  HGB 9.8* 9.2* 8.7*  HCT 31.4* 28.8* 27.6*  PLT 273 236 221    Coag's No results for input(s): APTT, INR in the last 168 hours.  Sepsis Markers  Recent Labs Lab 10/27/2016 0758 27-Oct-2016 1100 2016/10/27 1811 10/27/16 0540 10/28/16 0503  LATICACIDVEN 3.0* 2.1*  --   --   --   PROCALCITON  --   --  <0.10 <0.10 <0.10    ABG  Recent Labs Lab 2016/10/27 0758 October 27, 2016 1554 10/28/16 0321  PHART 7.43 7.44 7.45  PCO2ART 32 30* 37  PO2ART 158* 291* 98    Liver Enzymes  Recent Labs Lab 10/27/16 0758  AST 36  ALT 26  ALKPHOS 121  BILITOT 0.9  ALBUMIN 3.7    Cardiac Enzymes  Recent Labs Lab 10/27/16 1811 10-27-2016 2301 10/27/16 0540  TROPONINI 0.05* 0.05* 0.04*    Glucose  Recent Labs Lab 10/28/16 1221 10/28/16 1640 10/28/16 1956 10/29/16 0012 10/29/16 0428 10/29/16 0718  GLUCAP 105* 145* 107* 104* 159* 112*    ASSESSMENT / PLAN:  PULMONARY A: Acute hypoxemic respiratory failure Mechanically Intubated Suspect right lower lobe pneumonia - high risk of aspiration pneumonia Pulmonary Edema Review of the settings. PRVC/15/500/5/30%  P:   Vent settings established VAP bundle implemented SBT today as tolerated, wean down/off propofol weaning trial as tolerated Nebulized bronchodilators   CARDIOVASCULAR A:  Chronic atrial fibrillation with RVR-improved rate elevated Borderline hypotension Elevated troponin possibly due to demand ischemia P:  Discontinued heparin gtt  Started on Cardizem per tube Per family request if pt becomes hypotensive will not order pressors or insert central line    RENAL A:   No acute issues P:   Monitor BMET  intermittently Monitor I/Os Correct electrolytes as indicated  GASTROINTESTINAL A:   No acute issues P:   SUP: IV famotidine Will initiate TF- 10/28/16   HEMATOLOGIC A:   Mild chronic anemia without acute blood loss P:  Subq lovenox for VTE prophylaxis  Monitor CBC intermittently Will not administer blood products if needed per family wishes   INFECTIOUS A:   RLL PNA - suspect aspiration P:   Monitor temp, WBC count Continue abx as listed above Follow cultures   ENDOCRINE A:   Hyperglycemia  P:   Sliding scale insulin ordered - sensitive scale CBG q4hr  NEUROLOGIC A:   Advanced MS Acute encephalopathy ICU/ventilator associated discomfort P:   RASS goal: -2, -3 PAD protocol Propofol and Fentanyl gtt to maintain RASS goal  Prn Fentanyl for pain management   Meeting 4/5 with FAMILY: Spoke with pts husband and other family members they are not ready to proceed with comfort care measures only.  They would like to continue antibiotics, however they DO NOT want central line placement, blood products or pressor therapy.  She will also remain DO NOT RESUSCITATE palliative care assisting family as well regarding goals of treatment.  Wells Guiles, M.D.  10/29/2016

## 2016-10-29 NOTE — Progress Notes (Signed)
[facility] - [place] at [facility]   PATIENT NAME: Brandi Gibbs    MR#:  [id number]  DATE OF BIRTH:  1945/11/14  SUBJECTIVE:   Patient came in with increasing shortness of breath and was on BiPAP. Chest x-ray revealed bilateral basilar infiltrates. She got intubated and on the vent. Family in the room On propofol and precedex, tachycardia REVIEW OF SYSTEMS:   Review of Systems  Unable to perform ROS: Intubated   Tolerating Diet:intubated Tolerating PT: intubated  DRUG ALLERGIES:   Allergies  Allergen Reactions  . Other Other (See Comments)    Blood thinners - GI bleed  . Prednisone Other (See Comments)    Causes hair to fall out   . Omeprazole Rash    VITALS:  Blood pressure (!) 135/91, pulse (!) 135, temperature (!) 100.9 F (38.3 C), resp. rate 15, height 5\' 3"  (1.6 m), weight 82.5 kg (181 lb 14.1 oz), SpO2 91 %.  PHYSICAL EXAMINATION:   Physical Exam  GENERAL:  71 y.o.-year-old patient lying in the bed with no acute distress. Critically ill EYES: Pupils equal, round, reactive to light and accommodation. No scleral icterus. Extraocular muscles intact.  HEENT: Head atraumatic, normocephalic. Oropharynx and nasopharynx clear. Intubated and on the vent NECK:  Supple, no jugular venous distention. No thyroid enlargement, no tenderness.  LUNGS: Normal breath sounds bilaterally, no wheezing, rales, rhonchi. No use of accessory muscles of respiration.  CARDIOVASCULAR: S1, S2 normal. No murmurs, rubs, or gallops. Tachycardia ABDOMEN: Soft, nontender, nondistended. Bowel sounds present. No organomegaly or mass.  EXTREMITIES: No cyanosis, clubbing or edema b/l.    NEUROLOGIC: on the vent PSYCHIATRIC:  Sedated SKIN: No obvious rash, lesion, or ulcer.   LABORATORY PANEL:  CBC  Recent Labs Lab 10/29/16 0612  WBC 8.2  HGB 8.7*  HCT 27.6*  PLT 221    Chemistries   Recent Labs Lab 11/12/2016 0758  10/29/16 0612  NA 139  < > 141  K 3.9  < >  4.0  CL 106  < > 110  CO2 21*  < > 25  GLUCOSE 178*  < > 121*  BUN 17  < > 27*  CREATININE 0.82  < > 0.61  CALCIUM 8.9  < > 8.4*  MG  --   < > 1.8  AST 36  --   --   ALT 26  --   --   ALKPHOS 121  --   --   BILITOT 0.9  --   --   < > = values in this interval not displayed. Cardiac Enzymes  Recent Labs Lab 10/27/16 0540  TROPONINI 0.04*   RADIOLOGY:  Dg Chest 1 View  Result Date: 10/28/2016 CLINICAL DATA:  Shortness of breath. EXAM: CHEST 1 VIEW COMPARISON:  10/27/2016. FINDINGS: Endotracheal tube and NG tube in stable position. Metallic probe noted over the neck. Persistent cardiomegaly. Persistent bilateral pulmonary infiltrates/edema. No interim change. Tiny left pleural effusion cannot be excluded. No pneumothorax . IMPRESSION: 1. Lines and tubes in stable position. 2.  Persistent cardiomegaly with mild pulmonary vascular prominence. 3. Persistent bilateral pulmonary infiltrates/ edema. Tiny left pleural effusion cannot be excluded. No interim change. Electronically Signed   By: Maisie Fus  Register   On: 10/28/2016 06:26   ASSESSMENT AND PLAN:  Brandi Gibbs  is a 71 y.o. female with a known history of Multiple sclerosis, wheelchair-bound, restless leg syndrome, neurogenic bladder, hyperparathyroidism, hypertension, arthritis, reflux disease, history of atrial fibrillation status post cardioversion and not on  anticoagulation presents to hospital secondary to sudden onset of shortness of breath and cough that started last night.  #1 acute hypoxic respiratory failure-secondary to bibasilar pneumonia. Not on home oxygen. -She was on on BiPAP she deteriorated and is now intubated and on the ventilator day 3 -On broad spectrum IV antibiotics with IV cefepime. -IV propofol being weaned and added Precedex  #2 sepsis- secondary to bibasilar pneumonia.  blood cultures negative. -cont IV cefepime at this time.  MRSA PCR negative  #3 atrial fibrillation with rapid ventricular  response-known history of chronic atrial fibrillation status post cardioversion. -Likely triggered by respiratory failure. - was on  Cardizem drip.  -on oral metoprolol.  -was On heparin drip for anticoagulation--d/ced -appreciate cardiology input  #4 hypertension-blood pressure responded well with IV fluids. - On metoprolol orally and  Cardizem drip now. Continue to hold lisinopril  #5 multiple sclerosis with restless leg syndrome-continue home medications.  -Patient on maintenance Aubagio, also Mirapex for her restless legs. Her main deficits are muscle spasms in lower extremities and also gait issues  #6 DVT prophylaxis-lovenox  Palliative care consultation has been placed. Family to meet this afternoon.  Case discussed with Care Management/Social Worker. Management plans discussed with the patient, family and they are in agreement.  CODE STATUS: DO NOT RESUSCITATE  DVT Prophylaxis: Heparin drip TOTAL TIME TAKING CARE OF THIS PATIENT: 30 minutes.  >50% time spent on counselling and coordination of care husband    Note: This dictation was prepared with Dragon dictation along with smaller phrase technology. Any transcriptional errors that result from this process are unintentional.  Alijah Akram M.D on 10/29/2016 at 4:24 PM  Between 7am to 6pm - Pager - [phone]  After 6pm go to [email or web address] - Social research officer, government  [facility]  Office  [phone]  CC: Primary care physician; Dorothey Baseman, MD

## 2016-10-29 NOTE — Progress Notes (Signed)
MEDICATION RELATED CONSULT NOTE    Pharmacy Consult for electrolyte management   Pharmacy consulted for electrolyte management for 71 yo female requiring mechanical ventilation.    Plan:  No replacement warranted.  Will recheck electrolytes with am labs.   Allergies  Allergen Reactions  . Other Other (See Comments)    Blood thinners - GI bleed  . Prednisone Other (See Comments)    Causes hair to fall out   . Omeprazole Rash    Patient Measurements: Height: 5\' 3"  (160 cm) Weight: 181 lb 14.1 oz (82.5 kg) IBW/kg (Calculated) : 52.4  Vital Signs: Temp: 100.9 F (38.3 C) (04/06 2015) BP: 133/75 (04/06 2015) Pulse Rate: 124 (04/06 2015) Intake/Output from previous day: 04/05 0701 - 04/06 0700 In: 1272.5 [I.V.:544.5; NG/GT:678; IV Piggyback:50] Out: 1270 [Urine:1270] Intake/Output from this shift: Total I/O In: -  Out: 150 [Urine:150]  Labs:  Recent Labs  10/27/16 0540  10/27/16 1645 10/28/16 0503 10/29/16 0612  WBC 11.3*  --   --  9.7 8.2  HGB 9.8*  --   --  9.2* 8.7*  HCT 31.4*  --   --  28.8* 27.6*  PLT 273  --   --  236 221  CREATININE 1.14*  --   --  0.93 0.61  MG 1.5*  < > 3.3* 2.5* 1.8  PHOS  --   < > 2.4* 2.2* 2.8  < > = values in this interval not displayed. Estimated Creatinine Clearance: 66.5 mL/min (by C-G formula based on SCr of 0.61 mg/dL).   Pharmacy will continue to monitor and adjust per consult.    Varun Jourdan L 10/29/2016,8:56 PM

## 2016-10-29 NOTE — Progress Notes (Signed)
Pharmacy Antibiotic Note  Brandi Gibbs is a 71 y.o. female admitted on 11/12/2016 with sepsis and respiratory distress.  Pharmacy has been consulted for Vancomycin and Cefepime dosing. Received Vancomycin 1g IV and Zosyn 3.375g IV in the ED.  Plan: Will continue cefepime 2g IV Q12hr.   Height: 5\' 3"  (160 cm) Weight: 181 lb 14.1 oz (82.5 kg) IBW/kg (Calculated) : 52.4  Temp (24hrs), Avg:99.7 F (37.6 C), Min:98.1 F (36.7 C), Max:100.9 F (38.3 C)   Recent Labs Lab 11/21/2016 0758 11/08/2016 1100 10/27/16 0540 10/28/16 0503 10/29/16 0612  WBC 13.2*  --  11.3* 9.7 8.2  CREATININE 0.82  --  1.14* 0.93 0.61  LATICACIDVEN 3.0* 2.1*  --   --   --     Estimated Creatinine Clearance: 66.5 mL/min (by C-G formula based on SCr of 0.61 mg/dL).    Allergies  Allergen Reactions  . Other Other (See Comments)    Blood thinners - GI bleed  . Prednisone Other (See Comments)    Causes hair to fall out   . Omeprazole Rash    Antimicrobials this admission: Zosyn  4/3 x 1 dose Vancomycin 4/3 >> 4/5   Cefepime 4/3 >>  Dose adjustments this admission: 4/4 cefepime transitioned to 2g IV Q12hr and vancomycin transitioned to 1g IV q24hr.   Microbiology results: 4/3 BCx: no growth x 3 days 4/3 UCx: no growth  4/3 Sputum: normal flora 4/3 MRSA PCR: negative   Pharmacy will continue to monitor and adjust per consult.   Harveer Sadler L 10/29/2016 8:57 PM

## 2016-10-29 NOTE — Progress Notes (Addendum)
Daily Progress Note   Patient Name: Brandi Gibbs       Date: 10/29/2016 DOB: April 06, 1946  Age: 71 y.o. MRN#: [id number] Attending Physician: Fritzi Mandes, MD Primary Care Physician: Juluis Pitch, MD Admit Date: 10/25/2016  Reason for Consultation/Follow-up: Establishing goals of care  Subjective: Two sons and husband bedside.  Patient tolerating treatment well. Discussed that the PCCM team is starting weaning trials and that by Sunday/Monday if she is unable to breathe on her own it would indicate that the treatment is not truly helping and we will need to reconsider transitioning to comfort measures.  If she is able to breathe on her own successfully the next step will be to see if she can eat safely.  Husband, sons and Nelda Bucks at bedside indicated that they understood.  Patient Profile/HPI: 71 y.o. female  with past medical history of multiple sclerosis, neurogenic bladder, afib, seizures, and restless leg who was admitted on 10/31/2016 with hypoxic respiratory failure secondary to pneumonia and septic shock. She was intubated in the ER and is being treated in the ICU.  Her family medical doctor from [place] came to the ICU and brought a MOST form the that patient completed last week (3/27) that indicated she was a DNR and wanted comfort measures only. After our first day of Palliative Care meetings the Husband and sons felt that they should honor their mother's wishes and proceed with extubation and comfort care.  The next morning (4/5) just before extubation the grand son met with the husband and the two of them decided to treat the pneumonia rather than extubate.  PCCM felt this was reasonable and PMT supported the decision.   Length of Stay: 3  Current Medications: Scheduled Meds:  .  amiodarone  200 mg Oral BID  . ceFEPime (MAXIPIME) IV  2 g Intravenous Q24H  . chlorhexidine gluconate (MEDLINE KIT)  15 mL Mouth Rinse BID  . diltiazem  30 mg Oral Q6H  . enoxaparin (LOVENOX) injection  40 mg Subcutaneous Q24H  . famotidine  20 mg Per Tube Daily  . feeding supplement (PRO-STAT SUGAR FREE 64)  30 mL Per Tube BID  . feeding supplement (VITAL HIGH PROTEIN)  1,000 mL Per Tube Q24H  . insulin aspart  0-15 Units Subcutaneous Q4H  . ipratropium-albuterol  3 mL Nebulization Q6H  .  mouth rinse  15 mL Mouth Rinse QID  . methimazole  10 mg Oral Daily  . pramipexole  0.5 mg Oral QHS  . sodium chloride flush  3 mL Intravenous Q12H    Continuous Infusions: . propofol (DIPRIVAN) infusion 20 mcg/kg/min (10/29/16 1018)    PRN Meds: acetaminophen **OR** acetaminophen, fentaNYL (SUBLIMAZE) injection, metoprolol, succinylcholine  Physical Exam        Well developed thin female, intubated and sedated. CV irreg irreg Swelling in upper extremities (Not lower). Moving her legs.  Vital Signs: BP (!) 138/91   Pulse (!) 134   Temp 99.7 F (37.6 C)   Resp 18   Ht 5\' 3"  (1.6 m)   Wt 82.5 kg (181 lb 14.1 oz)   SpO2 98%   BMI 32.22 kg/m  SpO2: SpO2: 98 % O2 Device: O2 Device: Ventilator O2 Flow Rate: O2 Flow Rate (L/min): 40 L/min  Intake/output summary:  Intake/Output Summary (Last 24 hours) at 10/29/16 1054 Last data filed at 10/29/16 1018  Gross per 24 hour  Intake          1456.55 ml  Output             1420 ml  Net            36.55 ml   LBM: Last BM Date: 10/29/16 Baseline Weight: Weight: 77.1 kg (170 lb) Most recent weight: Weight: 82.5 kg (181 lb 14.1 oz)       Palliative Assessment/Data:    Flowsheet Rows     Most Recent Value  Intake Tab  Referral Department  Critical care  Unit at Time of Referral  Med/Surg Unit  Palliative Care Primary Diagnosis  Sepsis/Infectious Disease  Date Notified  10/27/16  Palliative Care Type  New Palliative care  Reason for  referral  Clarify Goals of Care  Date of Admission  11/02/2016  Date first seen by Palliative Care  10/27/16  # of days Palliative referral response time  0 Day(s)  # of days IP prior to Palliative referral  1  Clinical Assessment  Palliative Performance Scale Score  10%  Psychosocial & Spiritual Assessment  Palliative Care Outcomes  Patient/Family meeting held?  Yes  Who was at the meeting?  patient, husband, 2 sons and pastor  Palliative Care Outcomes  Clarified goals of care  Patient/Family wishes: Interventions discontinued/not started   Mechanical Ventilation, PEG, Tube feedings/TPN, Vasopressors      Patient Active Problem List   Diagnosis Date Noted  . Dyspnea   . Septic shock (HCC) 10/27/2016  . Right lower lobe pneumonia (HCC) 10/27/2016  . PAF (paroxysmal atrial fibrillation) (HCC) 10/27/2016  . Atrial fibrillation with RVR (HCC) 10/27/2016  . Leukocytosis 10/27/2016  . AKI (acute kidney injury) (HCC) 10/27/2016  . Elevated troponin 10/27/2016  . Goals of care, counseling/discussion   . Palliative care encounter   . Respiratory failure (HCC) 11/19/2016  . Vitamin D deficiency 02/25/2016  . Right shoulder pain 12/24/2014  . Arthralgia of shoulder 12/24/2014  . Altered mental state 12/06/2014  . Abnormal mental state 12/06/2014  . Ataxic gait 09/11/2014  . Cognitive changes 09/11/2014  . Other fatigue 09/11/2014  . Urinary hesitancy 09/11/2014  . Spasticity 09/11/2014  . Midline low back pain without sciatica 09/11/2014  . Insomnia 09/11/2014  . Restless leg 09/11/2014  . Snoring 09/11/2014  . Abnormal involuntary movement 09/11/2014  . Abnormal gait 09/11/2014  . Abnormal respiratory rate 09/11/2014  . Other symptoms and signs involving cognitive functions and  awareness 09/11/2014  . LBP (low back pain) 09/11/2014  . Does not feel right 09/11/2014  . DS (disseminated sclerosis) ([place]) 03/28/2014  . Osteopenia 03/28/2014  . Hyperthyroidism 03/28/2014  . BP  (high blood pressure) 03/28/2014  . HLD (hyperlipidemia) 03/28/2014  . Essential (primary) hypertension 03/28/2014  . 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 Multiple sclerosis ([place]) 03/28/2014    Palliative Care Plan    Recommendations/Plan:  DNR / CMO  Treat the treatable.  Do not escalate care.  No central line or pressors.  Time limited trial - Hopeful to wean by Sunday.  Hopeful she will be able to eat safely afterward.   Code Status:  DNR  Prognosis:   Unable to determine   Discharge Planning:  To Be Determined  Care plan was discussed with PCCM, Attending MD, and family.  Thank you for allowing the Palliative Medicine Team to assist in the care of this patient.  Total time spent:  35 min.     Greater than 50%  of this time was spent counseling and coordinating care related to the above assessment and plan.  Imogene Burn, PA-C Palliative Medicine  Please contact Palliative MedicineTeam phone at [phone] for questions and concerns between 7 am - 7 pm.   Please see AMION for individual provider pager numbers.

## 2016-10-29 NOTE — Progress Notes (Signed)
RN spoke with Sonda Rumble, NP and made her aware that patient was started on PO cardizem q6H today and has received 2 doses and has been getting PRN metoprolol for elevated heart rate in 130's afib and that cardizem drip was stopped yesterday and heart rate started increasing overnight.  NP stated she would order an amiodarone drip.

## 2016-10-30 DIAGNOSIS — N179 Acute kidney failure, unspecified: Secondary | ICD-10-CM

## 2016-10-30 DIAGNOSIS — J810 Acute pulmonary edema: Secondary | ICD-10-CM

## 2016-10-30 LAB — BASIC METABOLIC PANEL
ANION GAP: 6 (ref 5–15)
BUN: 25 mg/dL — ABNORMAL HIGH (ref 6–20)
CO2: 26 mmol/L (ref 22–32)
Calcium: 8.6 mg/dL — ABNORMAL LOW (ref 8.9–10.3)
Chloride: 108 mmol/L (ref 101–111)
Creatinine, Ser: 0.78 mg/dL (ref 0.44–1.00)
Glucose, Bld: 164 mg/dL — ABNORMAL HIGH (ref 65–99)
POTASSIUM: 4.1 mmol/L (ref 3.5–5.1)
SODIUM: 140 mmol/L (ref 135–145)

## 2016-10-30 LAB — BLOOD GAS, ARTERIAL
ACID-BASE EXCESS: 2.6 mmol/L — AB (ref 0.0–2.0)
Bicarbonate: 26.1 mmol/L (ref 20.0–28.0)
FIO2: 0.26
O2 SAT: 94.6 %
PATIENT TEMPERATURE: 37
PEEP/CPAP: 5 cmH2O
Pressure support: 5 cmH2O
pCO2 arterial: 35 mmHg (ref 32.0–48.0)
pH, Arterial: 7.48 — ABNORMAL HIGH (ref 7.350–7.450)
pO2, Arterial: 68 mmHg — ABNORMAL LOW (ref 83.0–108.0)

## 2016-10-30 LAB — CBC
HCT: 29.9 % — ABNORMAL LOW (ref 35.0–47.0)
Hemoglobin: 9.6 g/dL — ABNORMAL LOW (ref 12.0–16.0)
MCH: 23.7 pg — ABNORMAL LOW (ref 26.0–34.0)
MCHC: 32.3 g/dL (ref 32.0–36.0)
MCV: 73.3 fL — ABNORMAL LOW (ref 80.0–100.0)
PLATELETS: 220 10*3/uL (ref 150–440)
RBC: 4.07 MIL/uL (ref 3.80–5.20)
RDW: 18.5 % — AB (ref 11.5–14.5)
WBC: 7.9 10*3/uL (ref 3.6–11.0)

## 2016-10-30 LAB — HEPARIN LEVEL (UNFRACTIONATED): HEPARIN UNFRACTIONATED: 0.24 [IU]/mL — AB (ref 0.30–0.70)

## 2016-10-30 LAB — GLUCOSE, CAPILLARY
GLUCOSE-CAPILLARY: 143 mg/dL — AB (ref 65–99)
GLUCOSE-CAPILLARY: 150 mg/dL — AB (ref 65–99)
GLUCOSE-CAPILLARY: 157 mg/dL — AB (ref 65–99)

## 2016-10-30 LAB — MAGNESIUM: MAGNESIUM: 1.7 mg/dL (ref 1.7–2.4)

## 2016-10-30 MED ORDER — MORPHINE BOLUS VIA INFUSION
4.0000 mg | INTRAVENOUS | Status: DC | PRN
  Administered 2016-10-30 (×2): 4 mg via INTRAVENOUS
  Filled 2016-10-30: qty 4

## 2016-10-30 MED ORDER — ONDANSETRON 4 MG PO TBDP: 4.0000 mg | ORAL_TABLET | Freq: Four times a day (QID) | ORAL | Status: DC | PRN

## 2016-10-30 MED ORDER — ACETAMINOPHEN 325 MG PO TABS: 650.0000 mg | ORAL_TABLET | Freq: Four times a day (QID) | ORAL | Status: DC | PRN

## 2016-10-30 MED ORDER — HALOPERIDOL LACTATE 5 MG/ML IJ SOLN: 0.5000 mg | INTRAMUSCULAR | Status: DC | PRN

## 2016-10-30 MED ORDER — HALOPERIDOL LACTATE 2 MG/ML PO CONC
0.5000 mg | ORAL | Status: DC | PRN
Start: 2016-10-30 — End: 2016-10-31
  Filled 2016-10-30: qty 0.3

## 2016-10-30 MED ORDER — GLYCOPYRROLATE 1 MG PO TABS: 1.0000 mg | ORAL_TABLET | ORAL | Status: DC | PRN

## 2016-10-30 MED ORDER — ALBUTEROL SULFATE (2.5 MG/3ML) 0.083% IN NEBU: 2.5000 mg | INHALATION_SOLUTION | RESPIRATORY_TRACT | Status: DC | PRN

## 2016-10-30 MED ORDER — HEPARIN BOLUS VIA INFUSION
1000.0000 [IU] | Freq: Once | INTRAVENOUS | Status: AC
  Administered 2016-10-30: 1000 [IU] via INTRAVENOUS
  Filled 2016-10-30: qty 1000

## 2016-10-30 MED ORDER — FUROSEMIDE 10 MG/ML IJ SOLN
20.0000 mg | Freq: Two times a day (BID) | INTRAMUSCULAR | Status: DC
  Administered 2016-10-30: 20 mg via INTRAVENOUS
  Filled 2016-10-30: qty 2

## 2016-10-30 MED ORDER — ATROPINE SULFATE 1 % OP SOLN
1.0000 [drp] | OPHTHALMIC | Status: DC | PRN
  Filled 2016-10-30: qty 2

## 2016-10-30 MED ORDER — SODIUM CHLORIDE 0.9 % IV SOLN
5.0000 mg/h | INTRAVENOUS | Status: DC
  Administered 2016-10-30: 2 mg/h via INTRAVENOUS
  Administered 2016-10-30: 11 mg/h via INTRAVENOUS
  Filled 2016-10-30: qty 10

## 2016-10-30 MED ORDER — ACETAMINOPHEN 650 MG RE SUPP: 650.0000 mg | Freq: Four times a day (QID) | RECTAL | Status: DC | PRN

## 2016-10-30 MED ORDER — ONDANSETRON HCL 4 MG/2ML IJ SOLN: 4.0000 mg | Freq: Four times a day (QID) | INTRAMUSCULAR | Status: DC | PRN

## 2016-10-30 MED ORDER — POLYVINYL ALCOHOL 1.4 % OP SOLN
1.0000 [drp] | Freq: Four times a day (QID) | OPHTHALMIC | Status: DC | PRN
Start: 2016-10-30 — End: 2016-10-31
  Filled 2016-10-30: qty 15

## 2016-10-30 MED ORDER — SCOPOLAMINE 1 MG/3DAYS TD PT72
1.0000 | MEDICATED_PATCH | TRANSDERMAL | Status: DC
  Administered 2016-10-30: 1.5 mg via TRANSDERMAL
  Filled 2016-10-30: qty 1

## 2016-10-30 MED ORDER — ENOXAPARIN SODIUM 80 MG/0.8ML ~~LOC~~ SOLN: 1.0000 mg/kg | Freq: Two times a day (BID) | SUBCUTANEOUS | Status: DC

## 2016-10-30 MED ORDER — HALOPERIDOL 0.5 MG PO TABS
0.5000 mg | ORAL_TABLET | ORAL | Status: DC | PRN
  Filled 2016-10-30: qty 1

## 2016-10-31 LAB — CULTURE, BLOOD (ROUTINE X 2)
CULTURE: NO GROWTH
Culture: NO GROWTH
Special Requests: ADEQUATE

## 2016-11-02 ENCOUNTER — Telehealth: Payer: Self-pay | Admitting: Internal Medicine

## 2016-11-02 NOTE — Telephone Encounter (Signed)
Death cert placed in DK's folder since DR not available.

## 2016-11-02 NOTE — Telephone Encounter (Signed)
Received Death Certificate placed in nurse box °

## 2016-11-03 NOTE — Telephone Encounter (Signed)
Informed [place] home death certificate ready for pick up. Will place up front.

## 2016-11-23 NOTE — Progress Notes (Addendum)
PULMONARY / CRITICAL CARE MEDICINE   Name: Brandi Gibbs MRN: [id number] DOB: 1945-08-04    ADMISSION DATE:  10/28/2016  Addendum: Patient has sonorous, loud breath sounds., She is obvious and unable to control her secretions, and appears to be aspirating on her saliva. Discussed with the patient's husband and son at bedside, that patient is in distress and we will strive to keep her comfortable by starting her on morphine. We'll also give her atropine drops and scopolamine patch to help control secretions. We have called respiratory therapy to initiate some NT suctioning. Pastoral care also consulted. Brandi Gibbs.  10:46 AM   Addendum:  Pt tolerated weaning trial of 5/5 for 30 min with tidal volumes of 400 to 450. ABG reviewed, showed adequate compensations. D/W husband and son at bedside. Explained that the patient's breathing appears to have stabilized, uncertain that it would improve much more. Therefore will extubate at this time. Explained per pt's wishes she is DNR and will not be reintubated should she decline, they were in agreement. Should the patient decline she will be transitioned to comfort measures.  Brandi Gibbs 9:23 AM 11-19-16   PT PROFILE: 71 y.o. female former smoker with advanced MS, chronic AF admitted via ED suspected pulmonary edema and aspiration pneumonia.Pt has Advanced Directives and does not wish to undergo prolonged ventilation but husband opted for short term intubation if there is something reversible.   MAJOR EVENTS/TEST RESULTS: 04/03-Admitted via ED. Intubated after failed BiPAP attempt  INDWELLING DEVICES:: ETT 04/03 >>   MICRO DATA: MRSA PCR 04/03 >>negative Urine 04/03 >>negative   Resp 04/03 >> negative Blood 04/03 >>negative   Influenza 04/03>> negative.   ANTIMICROBIALS:  Vanc 04/03 >> 4/5 Cefepime 04/03 >>   Allergies  Allergen Reactions  . Other Other (See Comments)    Blood thinners - GI bleed  . Prednisone Other (See Comments)     Causes hair to fall out   . Omeprazole Rash    No current facility-administered medications on file prior to encounter.    Current Outpatient Prescriptions on File Prior to Encounter  Medication Sig  . acetaminophen (TYLENOL 8 HOUR ARTHRITIS PAIN) 650 MG CR tablet Take 650 mg by mouth 2 (two) times daily.  Marland Kitchen acetaminophen (TYLENOL) 325 MG tablet Take 325 mg by mouth 2 (two) times daily as needed.  . baclofen (LIORESAL) 10 MG tablet Take 1 tablet (10 mg total) by mouth 3 (three) times daily. (Patient taking differently: Take 10 mg by mouth 3 (three) times daily as needed. )  . diltiazem (DILACOR XR) 180 MG 24 hr capsule Take 180 mg by mouth daily.  Marland Kitchen lisinopril (PRINIVIL,ZESTRIL) 40 MG tablet Take 40 mg by mouth 2 (two) times daily.   . methimazole (TAPAZOLE) 10 MG tablet Take 1 tablet (10 mg total) by mouth daily.  . metoprolol tartrate (LOPRESSOR) 25 MG tablet Take 25 mg by mouth 2 (two) times daily.  Marland Kitchen oxybutynin (DITROPAN) 5 MG tablet TAKE 1 TABLET 3 TIMES DAILY (Patient taking differently: TAKE 1 TABLET 2 TIMES DAILY)  . pramipexole (MIRAPEX) 0.5 MG tablet TAKE 1 TABLET 3 TIMES DAILY (Patient taking differently: TAKE 1 TABLET at bedtime)  . simvastatin (ZOCOR) 40 MG tablet Take 40 mg by mouth at bedtime.   . Teriflunomide 14 MG TABS Take 14 mg by mouth daily.  . Vitamin D, Ergocalciferol, (DRISDOL) 50000 units CAPS capsule Take 50,000 Units by mouth every 30 (thirty) days.    REVIEW OF SYSTEMS:   Unable to obtain as  the patient is critically ill and intubated  SUBJECTIVE:  Per RN no acute issues overnight.  VITAL SIGNS: BP (!) 121/98   Pulse 97   Temp 100.2 F (37.9 C)   Resp 15   Ht '5\' 3"'$  (1.6 m)   Wt 172 lb 13.5 oz (78.4 kg)   SpO2 99%   BMI 30.62 kg/m   HEMODYNAMICS:    VENTILATOR SETTINGS: Vent Mode: PRVC FiO2 (%):  [30 %] 30 % Set Rate:  [15 bmp] 15 bmp Vt Set:  [500 mL] 500 mL PEEP:  [5 cmH20] 5 cmH20 Plateau Pressure:  [17 cmH20] 17 cmH20  INTAKE /  OUTPUT: I/O last 3 completed shifts: In: 2327.7 [I.V.:1097.7; NG/GT:1180; IV Piggyback:50] Out: 2570 [Urine:2370; Emesis/NG output:200]  PHYSICAL EXAMINATION: General: Chronically ill-appearing, intubated and on mechanical ventillation minimally responsive.  Neuro: sedated, does not follow commands, withdraws from painful stimulation,  PERRL HEENT: NCAT, sclerae white, no jvd Cardiovascular: irregular, irregular, no M/R?G Lungs: scattered rhonchi, no wheezes, crackles, rhonchi noted, even, non labored Abdomen: Soft, nontender, diminished bowel sounds Extremities: Warm, trace edema bilateral upper extremities  Skin: warm, dry and intact  LABS:  BMET  Recent Labs Lab 10/28/16 0503 10/29/16 0612 Nov 19, 2016 0251  NA 140 141 140  K 3.0* 4.0 4.1  CL 111 110 108  CO2 '24 25 26  '$ BUN 24* 27* 25*  CREATININE 0.93 0.61 0.78  GLUCOSE 122* 121* 164*    Electrolytes  Recent Labs Lab 10/27/16 1645 10/28/16 0503 10/29/16 0612 November 19, 2016 0251  CALCIUM  --  8.4* 8.4* 8.6*  MG 3.3* 2.5* 1.8 1.7  PHOS 2.4* 2.2* 2.8  --     CBC  Recent Labs Lab 10/27/16 0540 10/28/16 0503 10/29/16 0612  WBC 11.3* 9.7 8.2  HGB 9.8* 9.2* 8.7*  HCT 31.4* 28.8* 27.6*  PLT 273 236 221    Coag's  Recent Labs Lab 10/29/16 2024  APTT 34  INR 1.10    Sepsis Markers  Recent Labs Lab 11/05/2016 0758 10/24/2016 1100 11/14/2016 1811 10/27/16 0540 10/28/16 0503  LATICACIDVEN 3.0* 2.1*  --   --   --   PROCALCITON  --   --  <0.10 <0.10 <0.10    ABG  Recent Labs Lab 11/13/2016 0758 10/29/2016 1554 10/28/16 0321  PHART 7.43 7.44 7.45  PCO2ART 32 30* 37  PO2ART 158* 291* 98    Liver Enzymes  Recent Labs Lab 10/25/2016 0758  AST 36  ALT 26  ALKPHOS 121  BILITOT 0.9  ALBUMIN 3.7    Cardiac Enzymes  Recent Labs Lab 11/04/2016 1811 11/04/2016 2301 10/27/16 0540  TROPONINI 0.05* 0.05* 0.04*    Glucose  Recent Labs Lab 10/29/16 1146 10/29/16 1557 10/29/16 1947 11-19-16 0024  11-19-2016 0409 11/19/2016 0725  GLUCAP 124* 123* 127* 150* 143* 157*    ASSESSMENT / PLAN:  PULMONARY A: Acute hypoxemic respiratory failure with pulm edema and suspect right lower lobe aspiration pneumonia Pulmonary Edema Review of the settings. PRVC/15/500/5/30%  P:   Continue abx, diuresis.  Nebulized bronchodilators   CARDIOVASCULAR A:  Chronic atrial fibrillation with RVR-improved on cardizem drip. On heparin drip.   P:  Discontinue heparin gtt -- change to lovenox.  Started on Cardizem per tube Per family request if pt becomes hypotensive will not order pressors or insert central line    RENAL A:   No acute issues P:   Monitor BMET intermittently Monitor I/Os Correct electrolytes as indicated  GASTROINTESTINAL A:   No acute issues P:  SUP: IV famotidine TF to continue.   HEMATOLOGIC A:   Mild chronic anemia without acute blood loss P:  Subq lovenox for VTE prophylaxis  Monitor CBC intermittently Will not administer blood products if needed per family wishes   INFECTIOUS A:   RLL PNA - suspect aspiration P:   Monitor temp, WBC count Continue abx as listed above Follow cultures   ENDOCRINE A:   Hyperglycemia  P:   Sliding scale insulin ordered - sensitive scale CBG q4hr  NEUROLOGIC A:   Advanced MS Acute encephalopathy ICU/ventilator associated discomfort P:   RASS goal: -2, -3 PAD protocol Propofol and Fentanyl gtt to maintain RASS goal  Prn Fentanyl for pain management   Meeting 4/5 with FAMILY: Spoke with pts husband and other family members they are not ready to proceed with comfort care measures only.  They would like to continue antibiotics, however they DO NOT want central line placement, blood products or pressor therapy.  She will also remain DO NOT RESUSCITATE palliative care assisting family as well regarding goals of treatment.  4/7; met with husband at bedside, explained that the patient has experienced minimal change  overnight compared to yesterday. We will attempt a weaning trial today, the patient is placed on the 5/5, consider extubation if tolerated.  Marda Stalker, M.D.  Nov 15, 2016   Critical Care Attestation.  I have personally obtained a history, examined the patient, evaluated laboratory and imaging results, formulated the assessment and plan and placed orders. The Patient requires high complexity decision making for assessment and support, frequent evaluation and titration of therapies, application of advanced monitoring technologies and extensive interpretation of multiple databases. The patient has critical illness that could lead imminently to failure of 1 or more organ systems and requires the highest level of physician preparedness to intervene.  Critical Care Time devoted to patient care services described in this note is 35 minutes and is exclusive of time spent in procedures.

## 2016-11-23 NOTE — Progress Notes (Signed)
MEDICATION RELATED CONSULT NOTE    Pharmacy Consult for electrolyte management   Pharmacy consulted for electrolyte management for 71 yo female requiring mechanical ventilation.    Plan:  No replacement warranted.  Will recheck electrolytes with am labs.   Allergies  Allergen Reactions  . Other Other (See Comments)    Blood thinners - GI bleed  . Prednisone Other (See Comments)    Causes hair to fall out   . Omeprazole Rash    Patient Measurements: Height: 5\' 3"  (160 cm) Weight: 172 lb 13.5 oz (78.4 kg) IBW/kg (Calculated) : 52.4  Vital Signs: Temp: 100.8 F (38.2 C) (04/07 0400) BP: 113/74 (04/07 0400) Pulse Rate: 107 (04/07 0400) Intake/Output from previous day: 04/06 0701 - 04/07 0700 In: 1413.1 [I.V.:673.1; NG/GT:690; IV Piggyback:50] Out: 1950 [Urine:1750; Emesis/NG output:200] Intake/Output from this shift: Total I/O In: 656.9 [I.V.:476.9; NG/GT:180] Out: 350 [Urine:150; Emesis/NG output:200]  Labs:  Recent Labs  10/27/16 0540  10/27/16 1645 10/28/16 0503 10/29/16 0612 10/29/16 2024 23-Nov-2016 0251  WBC 11.3*  --   --  9.7 8.2  --   --   HGB 9.8*  --   --  9.2* 8.7*  --   --   HCT 31.4*  --   --  28.8* 27.6*  --   --   PLT 273  --   --  236 221  --   --   APTT  --   --   --   --   --  34  --   CREATININE 1.14*  --   --  0.93 0.61  --  0.78  MG 1.5*  < > 3.3* 2.5* 1.8  --  1.7  PHOS  --   < > 2.4* 2.2* 2.8  --   --   < > = values in this interval not displayed. Estimated Creatinine Clearance: 64.9 mL/min (by C-G formula based on SCr of 0.78 mg/dL).   Pharmacy will continue to monitor and adjust per consult.    4/7 AM electrolytes WNL. Recheck with tomorrow AM labs.  Judah Carchi S 23-Nov-2016,4:32 AM

## 2016-11-23 NOTE — Progress Notes (Signed)
Patient Name: Brandi Gibbs Date of Encounter: 11-13-2016  Primary Cardiologist: New to [facility] - consult by [facility] Problem List     Principal Problem:   Respiratory failure [facility]) Active Problems:   Multiple sclerosis ([place])   Septic shock ([place])   Right lower lobe pneumonia ([place])   PAF (paroxysmal atrial fibrillation) (HCC)   Atrial fibrillation with rapid ventricular response (HCC)   Leukocytosis   AKI (acute kidney injury) ([place])   Hyperthyroidism   Elevated troponin   Cognitive changes   Goals of care, counseling/discussion   Palliative care encounter   Dyspnea     Subjective   Remains intubated and sedated. Remains in Afib. Heart rate increased on 4/5 s/p stopping diltiazem gtt and starting PO diltiazem. Started on amiodarone gtt on 4/6 with improved rates. Remains on heparin gtt. Potassium at goal. Magnesium slightly low.    Inpatient Medications    Scheduled Meds: . ceFEPime (MAXIPIME) IV  2 g Intravenous Q24H  . chlorhexidine gluconate (MEDLINE KIT)  15 mL Mouth Rinse BID  . diltiazem  60 mg Per Tube Q12H  . famotidine  20 mg Per Tube Daily  . feeding supplement (PRO-STAT SUGAR FREE 64)  30 mL Per Tube BID  . feeding supplement (VITAL HIGH PROTEIN)  1,000 mL Per Tube Q24H  . insulin aspart  0-15 Units Subcutaneous Q4H  . ipratropium-albuterol  3 mL Nebulization Q6H  . mouth rinse  15 mL Mouth Rinse QID  . methimazole  10 mg Oral Daily  . pramipexole  0.5 mg Oral QHS  . sodium chloride flush  3 mL Intravenous Q12H   Continuous Infusions: . amiodarone 30 mg/hr (2016/11/13 0454)  . dexmedetomidine (PRECEDEX) IV infusion 1.2 mcg/kg/hr (11/13/2016 0454)  . heparin 1,100 Units/hr (2016/11/13 0419)  . propofol (DIPRIVAN) infusion Stopped (10/29/16 1306)   PRN Meds: acetaminophen **OR** acetaminophen, fentaNYL (SUBLIMAZE) injection, metoprolol, succinylcholine   Vital Signs    Vitals:   2016-11-13 0515 November 13, 2016 0530 2016/11/13 0545 13-Nov-2016 0600  BP: 129/89  128/82 125/81 (!) 121/98  Pulse: 92 100 97 97  Resp: '19 16 15 15  '$ Temp: (!) 100.6 F (38.1 C) (!) 100.4 F (38 C) (!) 100.4 F (38 C) 100.2 F (37.9 C)  TempSrc:      SpO2: 99% 99% 98% 99%  Weight:      Height:        Intake/Output Summary (Last 24 hours) at 11/13/16 0716 Last data filed at 11-13-2016 0622  Gross per 24 hour  Intake          1812.41 ml  Output             2050 ml  Net          -237.59 ml   Filed Weights   10/28/16 0600 10/29/16 0132 11/13/2016 0421  Weight: 181 lb 10.5 oz (82.4 kg) 181 lb 14.1 oz (82.5 kg) 172 lb 13.5 oz (78.4 kg)    Physical Exam    GEN: Intubated, in no acute distress, critically ill appearing.  HEENT: Grossly normal.  Neck: Supple, no JVD, carotid bruits, or masses. Cardiac: Irregularly irregular, no murmurs, rubs, or gallops. No clubbing, cyanosis, edema.  Radials/DP/PT 2+ and equal bilaterally.  Respiratory:  Diminished breath sounds bilaterally. Intubated.  GI: Soft, nontender, nondistended, BS + x 4. MS: no deformity or atrophy. Skin: warm and dry, no rash. Neuro:  Intubated and sedated. Psych: Intubated and sedate.  Labs    CBC  Recent  Labs  10/28/16 0503 10/29/16 0612  WBC 9.7 8.2  HGB 9.2* 8.7*  HCT 28.8* 27.6*  MCV 74.8* 74.9*  PLT 236 811   Basic Metabolic Panel  Recent Labs  10/28/16 0503 10/29/16 0612 Nov 08, 2016 0251  NA 140 141 140  K 3.0* 4.0 4.1  CL 111 110 108  CO2 '24 25 26  '$ GLUCOSE 122* 121* 164*  BUN 24* 27* 25*  CREATININE 0.93 0.61 0.78  CALCIUM 8.4* 8.4* 8.6*  MG 2.5* 1.8 1.7  PHOS 2.2* 2.8  --    Liver Function Tests No results for input(s): AST, ALT, ALKPHOS, BILITOT, PROT, ALBUMIN in the last 72 hours. No results for input(s): LIPASE, AMYLASE in the last 72 hours. Cardiac Enzymes No results for input(s): CKTOTAL, CKMB, CKMBINDEX, TROPONINI in the last 72 hours. BNP Invalid input(s): POCBNP D-Dimer No results for input(s): DDIMER in the last 72 hours. Hemoglobin A1C No results for  input(s): HGBA1C in the last 72 hours. Fasting Lipid Panel  Recent Labs  10/29/16 1624  TRIG 63   Thyroid Function Tests No results for input(s): TSH, T4TOTAL, T3FREE, THYROIDAB in the last 72 hours.  Invalid input(s): FREET3  Telemetry    Afib, 80s to low 100s bpm - Personally Reviewed  ECG    n/a - Personally Reviewed  Radiology    No results found.  Cardiac Studies   TTE 11/16/2016: Study Conclusions  - Left ventricle: The cavity size was normal. There was mild   concentric hypertrophy. Systolic function was normal. The   estimated ejection fraction was in the range of 60% to 65%. Wall   motion was normal; there were no regional wall motion   abnormalities. The study is not technically sufficient to allow   evaluation of LV diastolic function. - Left atrium: The atrium was mildly dilated. - Right ventricle: Systolic function was normal. - Pulmonary arteries: Systolic pressure was mildly elevated. PA   peak pressure: 35 mm Hg (S).  Impressions:  - Rhythm is atrial fibrillation.  Patient Profile     71 y.o. female with history of numerous medical issues including paroxysmal atrial fibrillation not on long term full-dose anticoagulation as an outpatient s/p remote cardioversion, carotid disease with carotid endarterectomy on the right, cognitive disorder, and hyperthyroidism on methimazole who presented with shortness of breath and acute respiratory failure with hypoxia requiring intubation in the setting of PNA.   Assessment & Plan    1. PAF with RVR: -Remains in Afib with improved rates in the 80s to low 100s bpm -Continue half-dose amiodarone infusion for rate control, possible transition to PO amiodarone first of the week -PO diltiazem for rate control  -Heparin gtt for full dose anticoagulation  -CHADS2VASc at least 4 (HTN, age x 1, vascular disease, female)  2. Acute respiratory failure with hypoxia: -Remains intubated x 4 days -Wean as able -2/2  PNA -ABX per IM  3. Sepsis 2/2 PNA: -Per IM as above -BP improved  4. Hyperthyroidism: -Methimazole   Signed, Christell Faith, PA-C [facility] Pager: [phone] November 08, 2016, 7:16 AM  Pt seen and examined   Pt is now palliative care on morphine infusion. IV meds otherwise have stopped . Will be available as needed.  Dorris Carnes 2016/11/08

## 2016-11-23 NOTE — Progress Notes (Signed)
Pt extubated by RT per Dr. Laurence Aly orders.

## 2016-11-23 NOTE — Death Summary Note (Signed)
DEATH SUMMARY   Patient Details  Name: Brandi Gibbs MRN: [id number] DOB: Jun 04, 1946  Admission/Discharge Information   Admit Date:  10/27/2016  Date of Death: Date of Death: 10-31-16  Time of Death: Time of Death: 12-02-2104  Length of Stay: 4  Referring Physician: Dorothey Baseman, MD   Reason(s) for Hospitalization  Respiratory failure.   Diagnoses  Preliminary cause of death:  Secondary Diagnoses (including complications and co-morbidities):  Principal Problem:   Respiratory failure (HCC) Active Problems:   Hyperthyroidism   Cognitive changes   Multiple sclerosis (HCC)   Septic shock (HCC)   Right lower lobe pneumonia (HCC)   PAF (paroxysmal atrial fibrillation) (HCC)   Atrial fibrillation with rapid ventricular response (HCC)   Leukocytosis   AKI (acute kidney injury) (HCC)   Elevated troponin   Goals of care, counseling/discussion   Palliative care encounter   Dyspnea   Brief Hospital Course (including significant findings, care, treatment, and services provided and events leading to death)  Brandi Gibbs is a 71 y.o. year old female who Is followed by pace. She has a diagnoses of advanced multiple sclerosis and was admitted with the above problems, inspected volume overload as well as suspected aspiration pneumonia. The patient had recently filled out to a MOST form, stating that she did not want any life support interventions and wanted only comfort measures. However, family opted to intubate the patient, when she presented in respiratory distress to give her a trial period. After several days of antibiotics. The patient's breathing appeared to improve slightly, she was subsequently extubated. However, soon after extubation, it was obvious that she was not able to control her airway, and she had sonorous breath sounds, she appeared to be in respiratory distress due to aspiration, and inability to swallow her secretions, with a weak, ineffective cough. This was discussed with  the patient's husband and sons at bedside. The patient was then transitioned to comfort measures only.    Pertinent Labs and Studies  Significant Diagnostic Studies   Microbiology Recent Results (from the past 240 hour(s))  Blood Culture (routine x 2)     Status: None (Preliminary result)   Collection Time: 10-27-16  7:58 AM  Result Value Ref Range Status   Specimen Description BLOOD L AC  Final   Special Requests   Final    BOTTLES DRAWN AEROBIC AND ANAEROBIC Blood Culture results may not be optimal due to an excessive volume of blood received in culture bottles   Culture NO GROWTH 4 DAYS  Final   Report Status PENDING  Incomplete  Urine culture     Status: None   Collection Time: October 27, 2016  7:58 AM  Result Value Ref Range Status   Specimen Description URINE, RANDOM  Final   Special Requests NONE  Final   Culture   Final    NO GROWTH Performed at [facility], [address] [place], [place] [address]    Report Status 10/27/2016 FINAL  Final  Blood Culture (routine x 2)     Status: None (Preliminary result)   Collection Time: 10/27/2016  7:59 AM  Result Value Ref Range Status   Specimen Description BLOOD R AC  Final   Special Requests   Final    BOTTLES DRAWN AEROBIC AND ANAEROBIC Blood Culture adequate volume   Culture NO GROWTH 4 DAYS  Final   Report Status PENDING  Incomplete  MRSA PCR Screening     Status: None   Collection Time: 27-Oct-2016  5:27 PM  Result Value Ref Range Status   MRSA by PCR NEGATIVE NEGATIVE Final    Comment:        The GeneXpert MRSA Assay (FDA approved for NASAL specimens only), is one component of a comprehensive MRSA colonization surveillance program. It is not intended to diagnose MRSA infection nor to guide or monitor treatment for MRSA infections.   Culture, respiratory (NON-Expectorated)     Status: None   Collection Time: 10/29/16  6:15 PM  Result Value Ref Range Status   Specimen Description TRACHEAL ASPIRATE  Final   Special  Requests NONE  Final   Gram Stain   Final    FEW SQUAMOUS EPITHELIAL CELLS PRESENT FEW WBC PRESENT,BOTH PMN AND MONONUCLEAR RARE GRAM POSITIVE COCCI IN PAIRS    Culture   Final    Consistent with normal respiratory flora. Performed at [facility], [address] [place], [place] [address]    Report Status 10/29/2016 FINAL  Final    Lab Basic Metabolic Panel:  Recent Labs Lab 10/29/16 0758  10/27/16 0540 10/27/16 1425 10/27/16 1645 10/28/16 0503 10/29/16 0612 11/10/2016 0251  NA 139  --  138  --   --  140 141 140  K 3.9  --  3.6  --   --  3.0* 4.0 4.1  CL 106  --  107  --   --  111 110 108  CO2 21*  --  20*  --   --  24 25 26   GLUCOSE 178*  --  224*  --   --  122* 121* 164*  BUN 17  --  28*  --   --  24* 27* 25*  CREATININE 0.82  --  1.14*  --   --  0.93 0.61 0.78  CALCIUM 8.9  --  8.6*  --   --  8.4* 8.4* 8.6*  MG  --   < > 1.5* 1.6* 3.3* 2.5* 1.8 1.7  PHOS  --   --   --  2.3* 2.4* 2.2* 2.8  --   < > = values in this interval not displayed. Liver Function Tests:  Recent Labs Lab 29-Oct-2016 0758  AST 36  ALT 26  ALKPHOS 121  BILITOT 0.9  PROT 7.3  ALBUMIN 3.7   No results for input(s): LIPASE, AMYLASE in the last 168 hours. No results for input(s): AMMONIA in the last 168 hours. CBC:  Recent Labs Lab 10-29-2016 0758 10/27/16 0540 10/28/16 0503 10/29/16 0612 11/03/2016 1016  WBC 13.2* 11.3* 9.7 8.2 7.9  NEUTROABS 11.7*  --   --   --   --   HGB 11.2* 9.8* 9.2* 8.7* 9.6*  HCT 36.1 31.4* 28.8* 27.6* 29.9*  MCV 74.3* 74.0* 74.8* 74.9* 73.3*  PLT 361 273 236 221 220   Cardiac Enzymes:  Recent Labs Lab 10-29-2016 0758 10-29-2016 1811 10/29/2016 2301 10/27/16 0540  TROPONINI 0.03* 0.05* 0.05* 0.04*   Sepsis Labs:  Recent Labs Lab 10-29-16 0758 10-29-2016 1100 10-29-2016 1811 10/27/16 0540 10/28/16 0503 10/29/16 0612 11/07/2016 1016  PROCALCITON  --   --  <0.10 <0.10 <0.10  --   --   WBC 13.2*  --   --  11.3* 9.7 8.2 7.9  LATICACIDVEN 3.0* 2.1*  --    --   --   --   --     Procedures/Operations  --   Shane Crutch 10/31/2016, 8:11 AM

## 2016-11-23 NOTE — Progress Notes (Signed)
Patient extubated per Dr. Romeo Rabon orders, placed on 2l [place] with sats in the mid 90's. Will continue to monitor.

## 2016-11-23 NOTE — Progress Notes (Signed)
ANTICOAGULATION CONSULT NOTE - Initial Consult  Pharmacy Consult for heparin drip Indication: atrial fibrillation  Allergies  Allergen Reactions  . Other Other (See Comments)    Blood thinners - GI bleed  . Prednisone Other (See Comments)    Causes hair to fall out   . Omeprazole Rash    Patient Measurements: Height: '5\' 3"'$  (160 cm) Weight: 181 lb 14.1 oz (82.5 kg) IBW/kg (Calculated) : 52.4 Heparin Dosing Weight: 69 kg  Vital Signs: Temp: 100.9 F (38.3 C) (04/07 0245) BP: 124/75 (04/07 0245) Pulse Rate: 101 (04/07 0245)  Labs:  Recent Labs  10/27/16 0540 10/28/16 0503 10/29/16 0612 10/29/16 2024 11/22/2016 0251  HGB 9.8* 9.2* 8.7*  --   --   HCT 31.4* 28.8* 27.6*  --   --   PLT 273 236 221  --   --   APTT  --   --   --  34  --   LABPROT  --   --   --  14.2  --   INR  --   --   --  1.10  --   HEPARINUNFRC 0.53 0.37 <0.10*  --  0.24*  CREATININE 1.14* 0.93 0.61  --   --   TROPONINI 0.04*  --   --   --   --     Estimated Creatinine Clearance: 66.5 mL/min (by C-G formula based on SCr of 0.61 mg/dL).   Medical History: Past Medical History:  Diagnosis Date  . AKI (acute kidney injury) ([place]) 10/27/2016  . Allergy   . Anxiety   . Arthritis   . GERD (gastroesophageal reflux disease)   . Hypertension   . Hyperthyroidism   . Movement disorder   . Multiple sclerosis ([place])   . Neuromuscular disorder ([place])   . PAF (paroxysmal atrial fibrillation) ([place])   . Restless leg syndrome   . Seizures ([place])   . Vision abnormalities     Medications:  Scheduled:  . ceFEPime (MAXIPIME) IV  2 g Intravenous Q24H  . chlorhexidine gluconate (MEDLINE KIT)  15 mL Mouth Rinse BID  . diltiazem  60 mg Per Tube Q12H  . famotidine  20 mg Per Tube Daily  . feeding supplement (PRO-STAT SUGAR FREE 64)  30 mL Per Tube BID  . feeding supplement (VITAL HIGH PROTEIN)  1,000 mL Per Tube Q24H  . heparin  1,000 Units Intravenous Once  . insulin aspart  0-15 Units Subcutaneous Q4H  .  ipratropium-albuterol  3 mL Nebulization Q6H  . mouth rinse  15 mL Mouth Rinse QID  . methimazole  10 mg Oral Daily  . pramipexole  0.5 mg Oral QHS  . sodium chloride flush  3 mL Intravenous Q12H   Infusions:  . amiodarone 30 mg/hr (2016/11/22 0045)  . dexmedetomidine (PRECEDEX) IV infusion 1.1 mcg/kg/hr (Nov 22, 2016 0037)  . heparin 950 Units/hr (10/29/16 2034)  . propofol (DIPRIVAN) infusion Stopped (10/29/16 1306)    Assessment: Pharmacy has been consulted to dose heparin in this 12 yoF with afib. Patient was receiving heparin for afib which was discontinued on 4/5. Patient was previously therapeutic without initial bolus at 950 units/hour.  Goal of Therapy:  Heparin level 0.3-0.7 units/ml Monitor platelets by anticoagulation protocol: Yes   Plan:  Give 0 units bolus x 1 Start heparin infusion at 950 units/hr Check anti-Xa level in 6 hours and daily while on heparin Continue to monitor H&H and platelets   4/7 03:00 heparin level 0.24. 1000 unit bolus and increase rate to  1100 units/hr. Recheck in 6 hours.  Darrow Bussing, PharmD Pharmacy Resident 05-Nov-2016 4:05 AM

## 2016-11-23 NOTE — Progress Notes (Signed)
Pt expired at 21:06.  Charge nurse notified Dr. Arsenio Loader.  Felicia rn and Renee, rn pronounced death.  Spouse at bedside.  Helmut Muster, nursing supervisor was notified and chaplain was called as well.  [place] [facility] was called.  Pt was released to funeral home.  All belongings were sent home with family.

## 2016-11-23 NOTE — Plan of Care (Signed)
Problem: Health Behavior/Discharge Planning: Goal: Ability to manage health-related needs will improve Outcome: Not Progressing Pt had to be started on a heparin gtt last night. Also had Cardizem via OG. Remains in AFIB. Not very responsive this shift. Unable to follow commands. Plan for terminal extubation reversed per family. Continue to monitor.

## 2016-11-23 NOTE — Progress Notes (Signed)
[employer] [facility] [place] at [facility]   PATIENT NAME: Brandi Gibbs    MR#:  [id number]  DATE OF BIRTH:  28-Jun-1946  SUBJECTIVE:  CHIEF COMPLAINT:   Chief Complaint  Patient presents with  . Shortness of Breath   - patient with multiple sclerosis admitted with acute respiratoryfailure-failed BiPAP in the emergency room and got intubated. Patient is a DO NOT RESUSCITATE. -Currently on minimal vent support.sedated with Precedex. -Heart rate better controlled on amiodarone drip and on heparin drip for A. Fib.  REVIEW OF SYSTEMS:  Review of Systems  Unable to perform ROS: Critical illness  and on ventilator  DRUG ALLERGIES:   Allergies  Allergen Reactions  . Other Other (See Comments)    Blood thinners - GI bleed  . Prednisone Other (See Comments)    Causes hair to fall out   . Omeprazole Rash    VITALS:  Blood pressure (!) 121/98, pulse 97, temperature 100.2 F (37.9 C), resp. rate 15, height 5\' 3"  (1.6 m), weight 78.4 kg (172 lb 13.5 oz), SpO2 99 %.  PHYSICAL EXAMINATION:  Physical Exam  GENERAL:  71 y.o.-year-old patient lying in the bed, intubated, on vent- critically ill appearing.  EYES: Pupils equal, round, sluggish reaction to light and accommodation. No scleral icterus. Extraocular muscles intact.  HEENT: Head atraumatic, normocephalic. Oropharynx and nasopharynx clear.  NECK:  Supple, no jugular venous distention. No thyroid enlargement, no tenderness.  LUNGS: coarse breath sounds bilaterally, no wheezing, rales or crepitation. No use of accessory muscles of respiration.  CARDIOVASCULAR: S1, S2 normal but irregular. No murmurs, rubs, or gallops.  ABDOMEN: Soft, nontender, nondistended. Bowel sounds present. No organomegaly or mass.  EXTREMITIES: No pedal edema, cyanosis, or clubbing.  NEUROLOGIC: sedated, occasional involuntary movement of lower extremities.  PSYCHIATRIC: The patient is sedated.  SKIN: No obvious rash, lesion, or ulcer.     LABORATORY PANEL:   CBC  Recent Labs Lab 10/29/16 0612  WBC 8.2  HGB 8.7*  HCT 27.6*  PLT 221   ------------------------------------------------------------------------------------------------------------------  Chemistries   Recent Labs Lab 11/14/2016 0758  2016/11/24 0251  NA 139  < > 140  K 3.9  < > 4.1  CL 106  < > 108  CO2 21*  < > 26  GLUCOSE 178*  < > 164*  BUN 17  < > 25*  CREATININE 0.82  < > 0.78  CALCIUM 8.9  < > 8.6*  MG  --   < > 1.7  AST 36  --   --   ALT 26  --   --   ALKPHOS 121  --   --   BILITOT 0.9  --   --   < > = values in this interval not displayed. ------------------------------------------------------------------------------------------------------------------  Cardiac Enzymes  Recent Labs Lab 10/27/16 0540  TROPONINI 0.04*   ------------------------------------------------------------------------------------------------------------------  RADIOLOGY:  No results found.  EKG:   Orders placed or performed during the hospital encounter of 10/31/2016  . ED EKG 12-Lead  . ED EKG 12-Lead  . EKG 12-Lead  . EKG 12-Lead    ASSESSMENT AND PLAN:   Brandi Gibbs a 71 y.o. femalewith a known history of Multiple sclerosis, wheelchair-bound, restless leg syndrome, neurogenic bladder, hyperparathyroidism, hypertension, arthritis, reflux disease, history of atrial fibrillation status post cardioversion and not on anticoagulation presents to hospital secondary to sudden onset of shortness of breath and cough that started last night.  #1 acute hypoxic respiratory failure-secondary to bibasilar pneumonia. Not on home oxygen. -Failed  BiPAP in ER-intubated and on the ventilator day 4 -On broad spectrum IV antibiotics with IV cefepime. -IV  Precedex for sedation - appreciate pulmonary critical care consultation. Remains on 30% FiO2 and PEEP of 5 -repeat chest x-ray showing persistent bilateral pulmonary infiltrates/edema  #2 sepsis- secondary  to bibasilar pneumonia.  blood cultures negative. -cont IV cefepime at this time.  MRSA PCR negative  #3 atrial fibrillation with rapid ventricular response-known history of chronic atrial fibrillation status post cardioversion. -Likely triggered by respiratory failure. - discontinue Cardizem drip due to hypotension, currently on amiodarone drip. Heparin drip for anticoagulation. -Heart rate is better controlled -appreciate cardiology input - ECHO normal normal with EF 60%, mild diastolic dysfunction and mildly elevated PA pressures  #4 hypertension-blood pressure responded well with IV fluids. - On oral cardizem.   #5 multiple sclerosis with restless leg syndrome-continue home medications.  -Patient on maintenance Aubagio, also Mirapex for her restless legs. Her main deficits aremuscle spasms in lower extremities and also gait issues  #6 DVT prophylaxis- remains on heparin gtt  #8 Hypomagnesemia- encourage replacement.   Palliative care consulted. Further management by critical care team. Plan to continue to wean over the weekend, if not successful, family to discuss again on Monday- possible extubation   All the records are reviewed and case discussed with Care Management/Social Workerr. Management plans discussed with the patient, family and they are in agreement.  CODE STATUS: DNR  TOTAL TIME TAKING CARE OF THIS PATIENT: 38 minutes.   POSSIBLE D/C IN 2-3 DAYS, DEPENDING ON CLINICAL CONDITION.   Enid Baas M.D on November 09, 2016 at 7:05 AM  Between 7am to 6pm - Pager - [phone]  After 6pm go to [email or web address] - Social research officer, government  [facility]  Office  [phone]  CC: Primary care physician; Dorothey Baseman, MD

## 2016-11-23 NOTE — Progress Notes (Signed)
Audible sonorous resps noted.  Pt given 20 lasix.  Breath sounds roncherous in all fields.

## 2016-11-23 NOTE — Progress Notes (Signed)
Dr Ardyth Man placed patient into PSV 5/5 and requests ABG at 0900.

## 2016-11-23 NOTE — Progress Notes (Signed)
Precedex stopped at this time, for weaning trial and WUA.

## 2016-11-23 DEATH — deceased

## 2016-12-16 ENCOUNTER — Ambulatory Visit: Payer: Medicare Other | Admitting: Neurology

## 2017-07-28 IMAGING — CT CT HEAD W/O CM
3 series · 15 of 45 positions shown, 18 images · non-contrast
Comparison: 12/06/2014 head CT.  10/14/2014 brain MR.

CLINICAL DATA: 70-year-old female with unwitnessed fall. Multiple
recent falls. Patient denies loss of consciousness. History of
hypertension and multiple sclerosis. Initial encounter.

EXAM:
CT HEAD WITHOUT CONTRAST
TECHNIQUE: Contiguous axial images were obtained from the base of the skull
through the vertex without intravenous contrast.

[Series 2: head wo · axial · 0.40mm/px · z∈[-229,-114]mm · 9 of 28 slices shown, 12 images]
[im 3/28  brain]
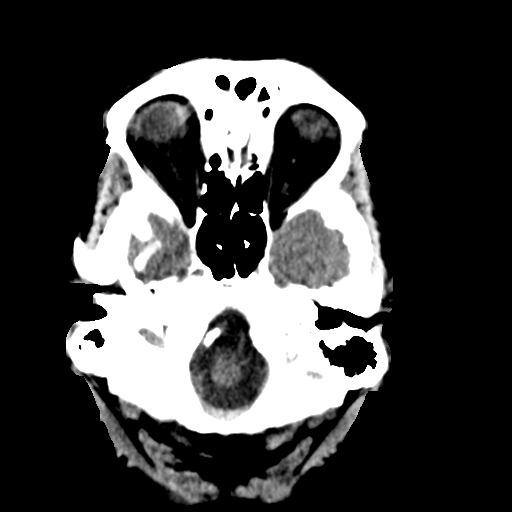
[im 3/28  bone]
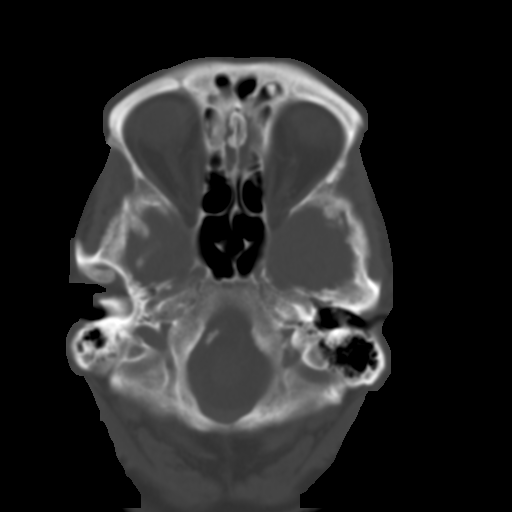
[im 6/28  brain]
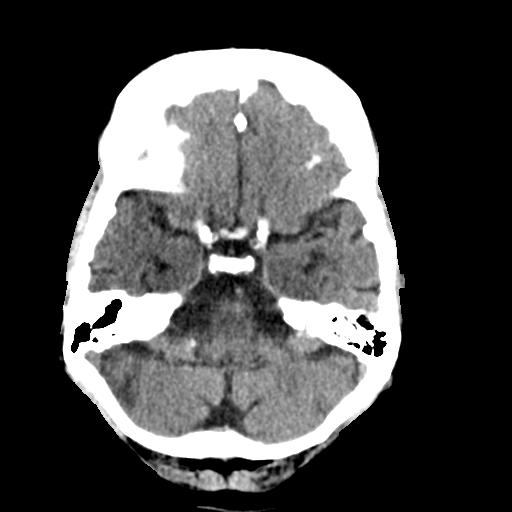
[im 9/28  brain]
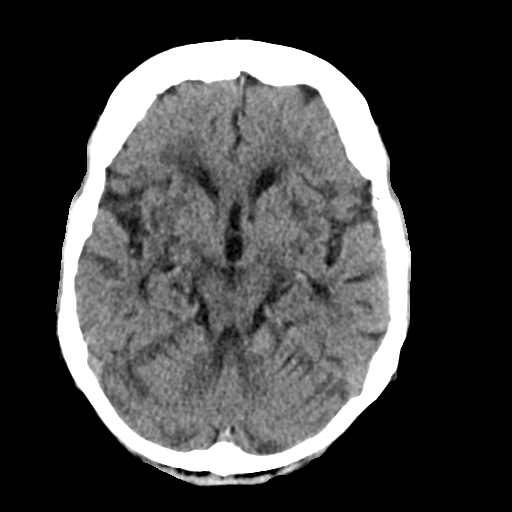
[im 12/28  brain]
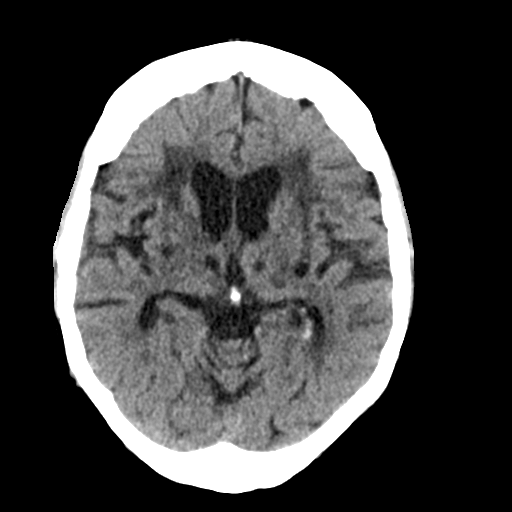
[im 15/28  brain]
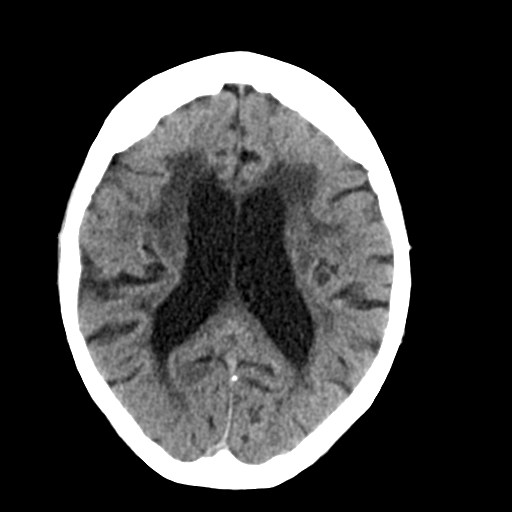
[im 15/28  bone]
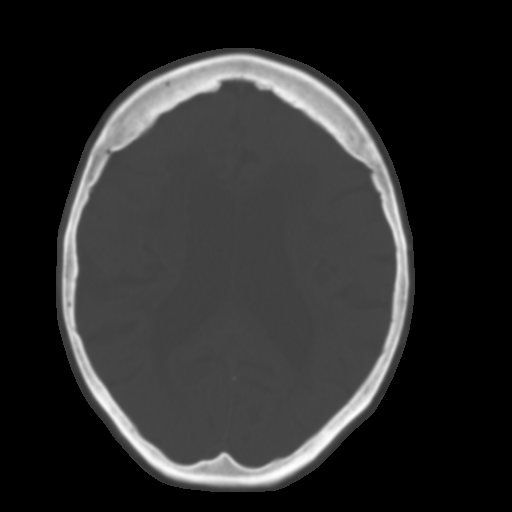
[im 17/28  brain]
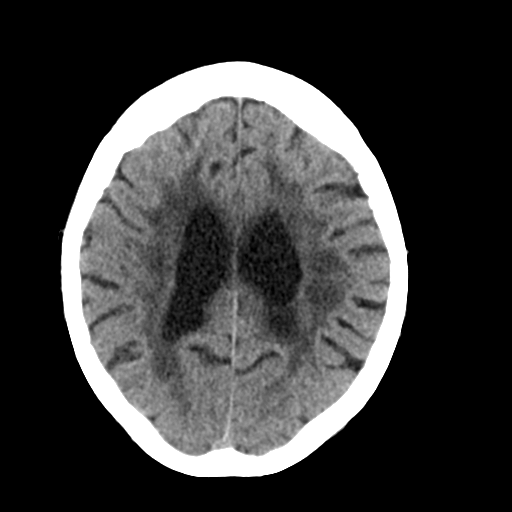
[im 20/28  brain]
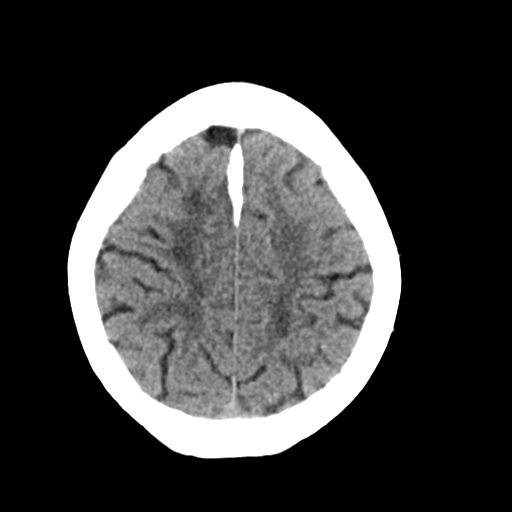
[im 23/28  brain]
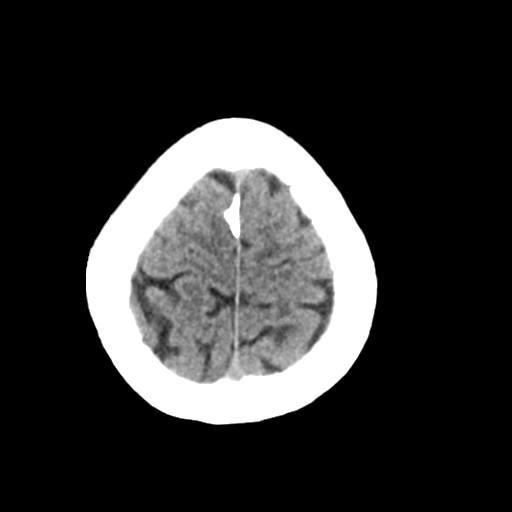
[im 26/28  brain]
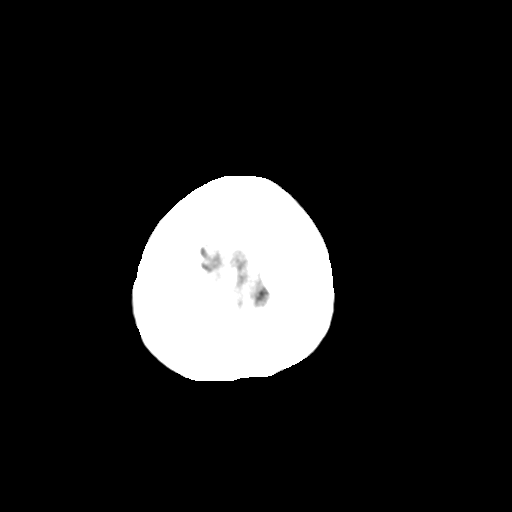
[im 26/28  bone]
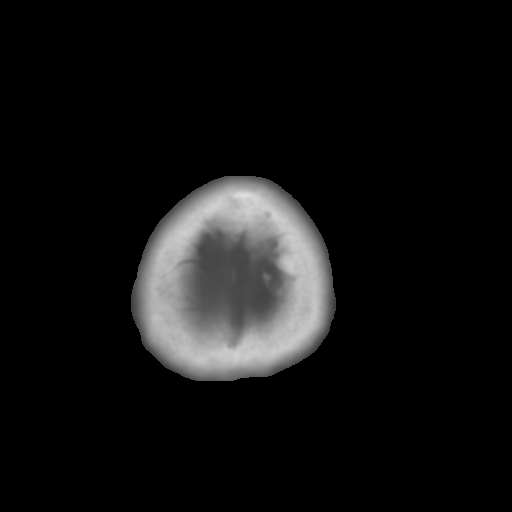

[Series 4: coronal soft tissue · coronal · 0.29mm/px · 3 of 61 slices shown]
[im 21/61  brain]
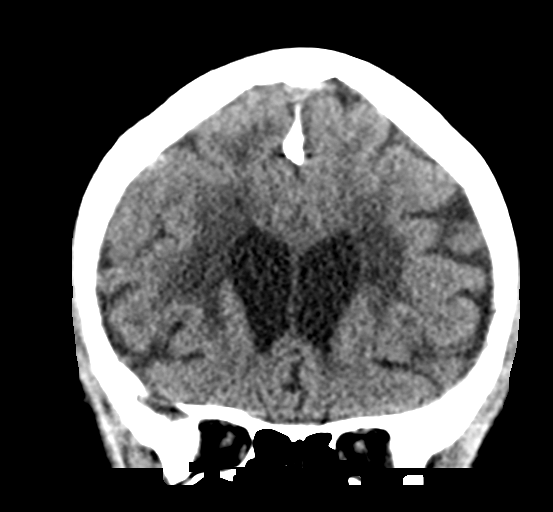
[im 27/61  brain]
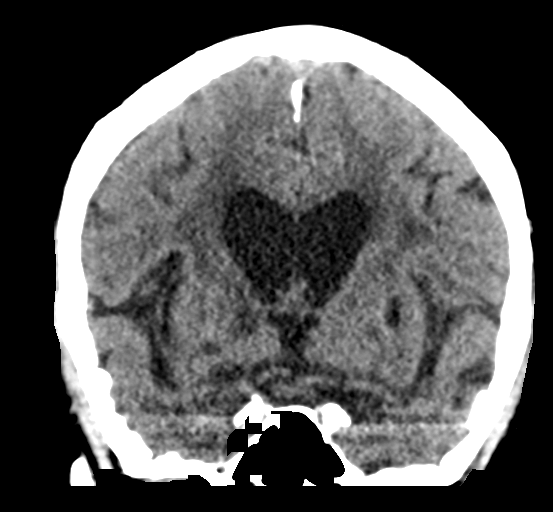
[im 34/61  brain]
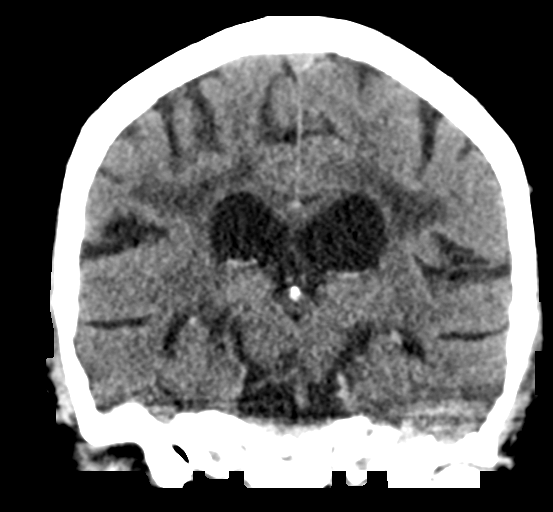

[Series 5: sagittal soft tissue · sagittal · 0.31mm/px · 3 of 48 slices shown]
[im 16/48  brain]
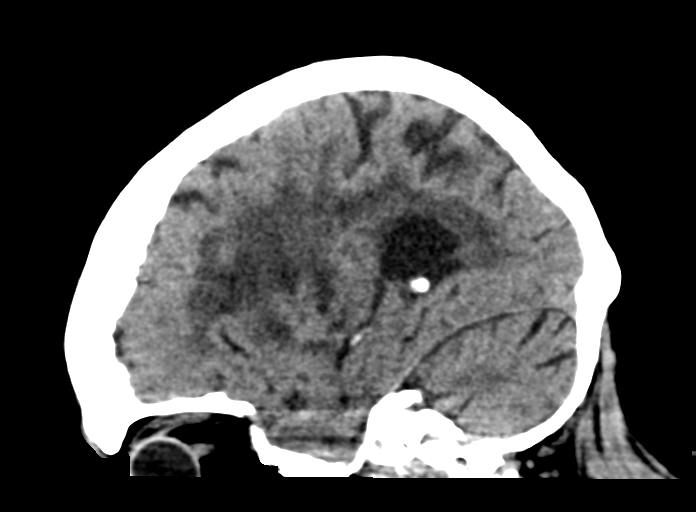
[im 24/48  brain]
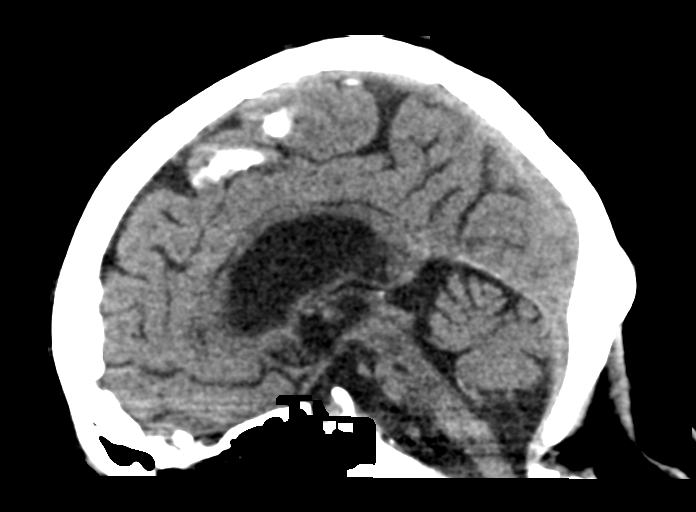
[im 32/48  brain]
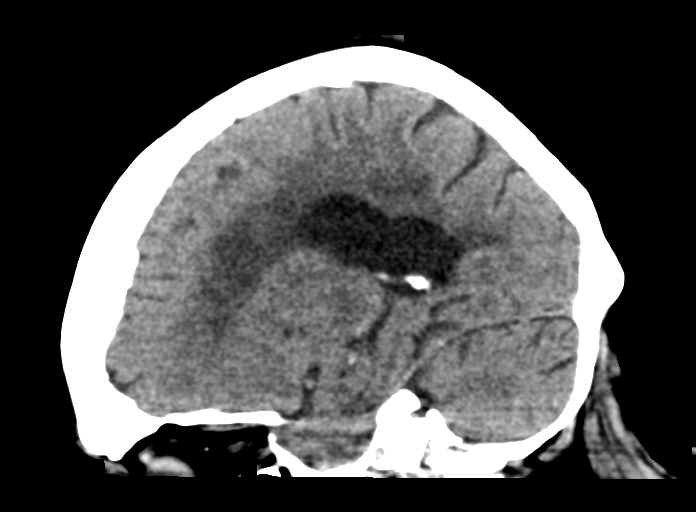

[15 of 45 positions shown; findings below may reference images not displayed]

FINDINGS: Brain: No intracranial hemorrhage.

Remote infarcts/prominent white matter changes (which may reflect
result of ischemia and/or demyelinating process given provided
history).

No CT evidence of large acute infarct.

Global atrophy without hydrocephalus.

No intracranial mass lesion noted on this unenhanced exam.

Vascular: Vascular calcifications.

Skull: No skull fracture. Hyperostosis frontalis interna. Dural
calcifications.

Sinuses/Orbits: No acute orbital abnormality. Orbits not entirely
imaged.

Visualized paranasal sinuses, mastoid air cells and middle ear
cavities are clear. Small osteoma left frontal sinus.

Other: Negative.
IMPRESSION: No skull fracture or intracranial hemorrhage.

Remote infarcts/prominent white matter changes (which may reflect
result of ischemia and/or demyelinating process given provided
history).

No CT evidence of large acute infarct.

Global atrophy.
